# Patient Record
Sex: Female | Born: 1960
Health system: Southern US, Community
[De-identification: ages and names within clinical notes are randomized; demographics above are authoritative.]

## PROBLEM LIST (undated history)

## (undated) DIAGNOSIS — I10 Essential (primary) hypertension: Secondary | ICD-10-CM

## (undated) DIAGNOSIS — R569 Unspecified convulsions: Secondary | ICD-10-CM

## (undated) DIAGNOSIS — K3184 Gastroparesis: Secondary | ICD-10-CM

## (undated) DIAGNOSIS — B001 Herpesviral vesicular dermatitis: Secondary | ICD-10-CM

## (undated) DIAGNOSIS — F329 Major depressive disorder, single episode, unspecified: Secondary | ICD-10-CM

## (undated) DIAGNOSIS — E785 Hyperlipidemia, unspecified: Secondary | ICD-10-CM

## (undated) DIAGNOSIS — F32A Depression, unspecified: Secondary | ICD-10-CM

## (undated) DIAGNOSIS — G43909 Migraine, unspecified, not intractable, without status migrainosus: Secondary | ICD-10-CM

## (undated) DIAGNOSIS — E119 Type 2 diabetes mellitus without complications: Secondary | ICD-10-CM

## (undated) HISTORY — DX: Essential (primary) hypertension: I10

## (undated) HISTORY — DX: Major depressive disorder, single episode, unspecified: F32.9

## (undated) HISTORY — DX: Type 2 diabetes mellitus without complications: E11.9

## (undated) HISTORY — DX: Herpesviral vesicular dermatitis: B00.1

## (undated) HISTORY — DX: Migraine, unspecified, not intractable, without status migrainosus: G43.909

## (undated) HISTORY — DX: Unspecified convulsions: R56.9

## (undated) HISTORY — DX: Hyperlipidemia, unspecified: E78.5

## (undated) HISTORY — DX: Depression, unspecified: F32.A

## (undated) HISTORY — DX: Gastroparesis: K31.84

## (undated) HISTORY — PX: BACK SURGERY: SHX140

---

## 1998-03-26 ENCOUNTER — Observation Stay (HOSPITAL_COMMUNITY): Admission: RE | Admit: 1998-03-26 | Discharge: 1998-03-27 | Payer: Self-pay | Admitting: *Deleted

## 2002-02-19 ENCOUNTER — Encounter: Payer: Self-pay | Admitting: Obstetrics and Gynecology

## 2002-02-19 ENCOUNTER — Encounter: Admission: RE | Admit: 2002-02-19 | Discharge: 2002-02-19 | Payer: Self-pay | Admitting: Obstetrics and Gynecology

## 2002-06-05 HISTORY — PX: CHOLECYSTECTOMY: SHX55

## 2005-08-31 ENCOUNTER — Encounter: Admission: RE | Admit: 2005-08-31 | Discharge: 2005-10-19 | Payer: Self-pay | Admitting: *Deleted

## 2006-04-10 ENCOUNTER — Encounter: Admission: RE | Admit: 2006-04-10 | Discharge: 2006-04-10 | Payer: Self-pay | Admitting: Obstetrics and Gynecology

## 2007-09-30 ENCOUNTER — Encounter: Admission: RE | Admit: 2007-09-30 | Discharge: 2007-09-30 | Payer: Self-pay | Admitting: Obstetrics and Gynecology

## 2008-10-02 ENCOUNTER — Encounter: Admission: RE | Admit: 2008-10-02 | Discharge: 2008-10-02 | Payer: Self-pay | Admitting: Obstetrics and Gynecology

## 2009-10-20 ENCOUNTER — Encounter: Admission: RE | Admit: 2009-10-20 | Discharge: 2009-10-20 | Payer: Self-pay | Admitting: Obstetrics and Gynecology

## 2009-12-08 ENCOUNTER — Emergency Department (HOSPITAL_COMMUNITY): Admission: EM | Admit: 2009-12-08 | Discharge: 2009-12-08 | Payer: Self-pay | Admitting: Emergency Medicine

## 2010-01-18 ENCOUNTER — Encounter: Admission: RE | Admit: 2010-01-18 | Discharge: 2010-03-03 | Payer: Self-pay | Admitting: Neurosurgery

## 2010-02-01 ENCOUNTER — Encounter: Admission: RE | Admit: 2010-02-01 | Discharge: 2010-02-01 | Payer: Self-pay | Admitting: Neurosurgery

## 2010-06-26 ENCOUNTER — Encounter: Payer: Self-pay | Admitting: Obstetrics and Gynecology

## 2010-09-08 ENCOUNTER — Other Ambulatory Visit: Payer: Self-pay | Admitting: Obstetrics and Gynecology

## 2010-09-08 DIAGNOSIS — Z1231 Encounter for screening mammogram for malignant neoplasm of breast: Secondary | ICD-10-CM

## 2010-11-01 ENCOUNTER — Ambulatory Visit
Admission: RE | Admit: 2010-11-01 | Discharge: 2010-11-01 | Disposition: A | Discharge status: Home / Self Care | Payer: BC Managed Care – PPO | Source: Ambulatory Visit | Attending: Obstetrics and Gynecology | Admitting: Obstetrics and Gynecology

## 2010-11-01 DIAGNOSIS — Z1231 Encounter for screening mammogram for malignant neoplasm of breast: Secondary | ICD-10-CM

## 2011-08-31 ENCOUNTER — Other Ambulatory Visit: Payer: Self-pay | Admitting: Obstetrics and Gynecology

## 2011-08-31 DIAGNOSIS — Z1231 Encounter for screening mammogram for malignant neoplasm of breast: Secondary | ICD-10-CM

## 2011-11-02 ENCOUNTER — Ambulatory Visit: Payer: BC Managed Care – PPO

## 2011-11-03 ENCOUNTER — Ambulatory Visit: Payer: BC Managed Care – PPO

## 2012-01-09 ENCOUNTER — Ambulatory Visit
Admission: RE | Admit: 2012-01-09 | Discharge: 2012-01-09 | Disposition: A | Discharge status: Home / Self Care | Payer: BC Managed Care – PPO | Source: Ambulatory Visit | Attending: Obstetrics and Gynecology | Admitting: Obstetrics and Gynecology

## 2012-01-09 DIAGNOSIS — Z1231 Encounter for screening mammogram for malignant neoplasm of breast: Secondary | ICD-10-CM

## 2012-12-24 ENCOUNTER — Other Ambulatory Visit: Payer: Self-pay

## 2012-12-24 DIAGNOSIS — Z1231 Encounter for screening mammogram for malignant neoplasm of breast: Secondary | ICD-10-CM

## 2013-01-15 ENCOUNTER — Ambulatory Visit
Admission: RE | Admit: 2013-01-15 | Discharge: 2013-01-15 | Disposition: A | Discharge status: Home / Self Care | Payer: BC Managed Care – PPO | Source: Ambulatory Visit

## 2013-01-15 DIAGNOSIS — Z1231 Encounter for screening mammogram for malignant neoplasm of breast: Secondary | ICD-10-CM

## 2013-12-24 LAB — HM DIABETES EYE EXAM

## 2015-09-10 DIAGNOSIS — K589 Irritable bowel syndrome without diarrhea: Secondary | ICD-10-CM | POA: Diagnosis not present

## 2015-09-10 DIAGNOSIS — R197 Diarrhea, unspecified: Secondary | ICD-10-CM | POA: Diagnosis not present

## 2015-09-10 DIAGNOSIS — E1165 Type 2 diabetes mellitus with hyperglycemia: Secondary | ICD-10-CM | POA: Diagnosis not present

## 2015-10-06 DIAGNOSIS — Z713 Dietary counseling and surveillance: Secondary | ICD-10-CM | POA: Diagnosis not present

## 2015-12-09 DIAGNOSIS — G43909 Migraine, unspecified, not intractable, without status migrainosus: Secondary | ICD-10-CM | POA: Diagnosis not present

## 2015-12-09 DIAGNOSIS — K589 Irritable bowel syndrome without diarrhea: Secondary | ICD-10-CM | POA: Diagnosis not present

## 2015-12-09 DIAGNOSIS — E1165 Type 2 diabetes mellitus with hyperglycemia: Secondary | ICD-10-CM | POA: Diagnosis not present

## 2016-01-17 DIAGNOSIS — M79671 Pain in right foot: Secondary | ICD-10-CM | POA: Diagnosis not present

## 2016-01-17 DIAGNOSIS — M7989 Other specified soft tissue disorders: Secondary | ICD-10-CM | POA: Diagnosis not present

## 2016-01-31 ENCOUNTER — Ambulatory Visit (INDEPENDENT_AMBULATORY_CARE_PROVIDER_SITE_OTHER): Payer: BLUE CROSS/BLUE SHIELD | Admitting: Physician Assistant

## 2016-01-31 ENCOUNTER — Encounter (INDEPENDENT_AMBULATORY_CARE_PROVIDER_SITE_OTHER): Payer: Self-pay

## 2016-01-31 ENCOUNTER — Encounter: Payer: Self-pay | Admitting: Physician Assistant

## 2016-01-31 VITALS — BP 137/89 | HR 90 | Temp 99.9°F | Ht 66.0 in | Wt 187.4 lb

## 2016-01-31 DIAGNOSIS — J0120 Acute ethmoidal sinusitis, unspecified: Secondary | ICD-10-CM

## 2016-01-31 DIAGNOSIS — J209 Acute bronchitis, unspecified: Secondary | ICD-10-CM

## 2016-01-31 MED ORDER — ALBUTEROL SULFATE HFA 108 (90 BASE) MCG/ACT IN AERS: 2.0000 | INHALATION_SPRAY | Freq: Four times a day (QID) | RESPIRATORY_TRACT | 0 refills | Status: DC | PRN

## 2016-01-31 MED ORDER — CLARITHROMYCIN 500 MG PO TABS: 500.0000 mg | ORAL_TABLET | Freq: Two times a day (BID) | ORAL | 1 refills | Status: DC

## 2016-01-31 MED ORDER — HYDROCODONE-HOMATROPINE 5-1.5 MG/5ML PO SYRP: 10.0000 mL | ORAL_SOLUTION | Freq: Four times a day (QID) | ORAL | 0 refills | Status: DC | PRN

## 2016-01-31 MED ORDER — METHYLPREDNISOLONE ACETATE 80 MG/ML IJ SUSP
80.0000 mg | Freq: Once | INTRAMUSCULAR | Status: AC
  Administered 2016-01-31: 80 mg via INTRAMUSCULAR

## 2016-01-31 NOTE — Patient Instructions (Signed)

## 2016-01-31 NOTE — Progress Notes (Signed)
BP 137/89 (BP Location: Left Arm, Patient Position: Sitting, Cuff Size: Large)   Pulse 90   Temp 99.9 F (37.7 C) (Oral)   Ht 5\' 6"  (1.676 m)   Wt 187 lb 6.4 oz (85 kg)   BMI 30.25 kg/m    Subjective:    Patient ID: Carrie [name], female    DOB: [birth year], 55 y.o.   MRN: [id number]  HPI: Carrie [name] is a 55 y.o. female presenting on 01/31/2016 for Cough (x 2 weeks ); Hoarse; head congestion; and Eye Drainage (left. Both eyes were matted together this morining )  HPI Patient here to be established as new patient at [facility].  This patient is known to me from [facility].  Has had 2 weeks of severe cough and congestion. She coughs to the point of shortness of breath. She has had tremendous amount of production with her cough and with her sinuses. She states she has seen some brown discoloration of the drainage. This morning she woke up with even her eyes being very congested and mucus field. She has also had associated hoarseness going on. She does have an albuterol inhaler.  All of his other medications and medical conditions are reviewed today.  Relevant past medical, surgical, family and social history reviewed and updated as indicated. Interim medical history since our last visit reviewed. Allergies and medications reviewed and updated. DATA REVIEWED: CHART IN EPIC  Review of Systems  Constitutional: Positive for fatigue and fever. Negative for activity change.  HENT: Positive for congestion, ear pain, facial swelling, nosebleeds, postnasal drip, sinus pressure and sore throat.   Eyes: Negative.   Respiratory: Positive for cough and wheezing.   Cardiovascular: Negative.  Negative for chest pain.  Gastrointestinal: Negative.  Negative for abdominal pain.  Endocrine: Negative.   Genitourinary: Negative.  Negative for dysuria.  Musculoskeletal: Negative.   Skin: Negative.   Neurological: Negative.     Per HPI unless specifically indicated  above     Medication List       Accurate as of 01/31/16  3:48 PM. Always use your most recent med list.          albuterol 108 (90 Base) MCG/ACT inhaler Commonly known as:  PROVENTIL HFA;VENTOLIN HFA Inhale 2 puffs into the lungs every 6 (six) hours as needed for wheezing or shortness of breath.   clarithromycin 500 MG tablet Commonly known as:  BIAXIN Take 1 tablet (500 mg total) by mouth 2 (two) times daily.   HYDROcodone-homatropine 5-1.5 MG/5ML syrup Commonly known as:  HYCODAN Take 10 mLs by mouth every 6 (six) hours as needed for cough.   JANUVIA 100 MG tablet Generic drug:  sitaGLIPtin Take 100 mg by mouth daily.   metFORMIN 500 MG 24 hr tablet Commonly known as:  GLUCOPHAGE-XR Take 1,000 mg by mouth 2 (two) times daily.   PARoxetine 20 MG tablet Commonly known as:  PAXIL Take 20 mg by mouth daily.   SUMAtriptan 100 MG tablet Commonly known as:  IMITREX   topiramate 50 MG tablet Commonly known as:  TOPAMAX Take 50 mg by mouth daily.   valACYclovir 1000 MG tablet Commonly known as:  VALTREX          Objective:    BP 137/89 (BP Location: Left Arm, Patient Position: Sitting, Cuff Size: Large)   Pulse 90   Temp 99.9 F (37.7 C) (Oral)   Ht 5\' 6"  (1.676 m)   Wt 187 lb 6.4 oz (  85 kg)   BMI 30.25 kg/m   No Known Allergies  Wt Readings from Last 3 Encounters:  01/31/16 187 lb 6.4 oz (85 kg)    Physical Exam  Constitutional: She is oriented to person, place, and time. She appears well-developed and well-nourished.  HENT:  Head: Normocephalic and atraumatic.  Right Ear: There is drainage and tenderness.  Left Ear: There is drainage and tenderness.  Nose: Mucosal edema and rhinorrhea present. Right sinus exhibits maxillary sinus tenderness and frontal sinus tenderness. Left sinus exhibits maxillary sinus tenderness and frontal sinus tenderness.  Mouth/Throat: Oropharyngeal exudate and posterior oropharyngeal erythema present.  Eyes: Conjunctivae and  EOM are normal. Pupils are equal, round, and reactive to light.  Neck: Normal range of motion. Neck supple.  Cardiovascular: Normal rate, regular rhythm, normal heart sounds and intact distal pulses.   Pulmonary/Chest: Effort normal. She has wheezes in the right upper field and the left upper field.  Abdominal: Soft. Bowel sounds are normal.  Neurological: She is alert and oriented to person, place, and time. She has normal reflexes.  Skin: Skin is warm and dry. No rash noted.  Psychiatric: She has a normal mood and affect. Her behavior is normal. Judgment and thought content normal.    No results found for this or any previous visit.    Assessment & Plan:   1. Acute bronchitis, unspecified organism - clarithromycin (BIAXIN) 500 MG tablet; Take 1 tablet (500 mg total) by mouth 2 (two) times daily.  Dispense: 20 tablet; Refill: 1 - methylPREDNISolone acetate (DEPO-MEDROL) injection 80 mg; Inject 1 mL (80 mg total) into the muscle once. - HYDROcodone-homatropine (HYCODAN) 5-1.5 MG/5ML syrup; Take 10 mLs by mouth every 6 (six) hours as needed for cough.  Dispense: 240 mL; Refill: 0 - albuterol (PROVENTIL HFA;VENTOLIN HFA) 108 (90 Base) MCG/ACT inhaler; Inhale 2 puffs into the lungs every 6 (six) hours as needed for wheezing or shortness of breath.  Dispense: 1 Inhaler; Refill: 0  2. Acute ethmoidal sinusitis, recurrence not specified - clarithromycin (BIAXIN) 500 MG tablet; Take 1 tablet (500 mg total) by mouth 2 (two) times daily.  Dispense: 20 tablet; Refill: 1  No other refills are needed at this time. Continue all other maintenance medications as listed above.  Follow up plan: Return in about 2 months (around 04/01/2016) for diabetes.     Terald Sleeper PA-C [facility] [address]  [place], [place] [address] [phone]   01/31/2016, 3:48 PM

## 2016-02-14 DIAGNOSIS — Z01419 Encounter for gynecological examination (general) (routine) without abnormal findings: Secondary | ICD-10-CM | POA: Diagnosis not present

## 2016-02-14 DIAGNOSIS — Z113 Encounter for screening for infections with a predominantly sexual mode of transmission: Secondary | ICD-10-CM | POA: Diagnosis not present

## 2016-02-14 DIAGNOSIS — Z1389 Encounter for screening for other disorder: Secondary | ICD-10-CM | POA: Diagnosis not present

## 2016-02-14 DIAGNOSIS — Z124 Encounter for screening for malignant neoplasm of cervix: Secondary | ICD-10-CM | POA: Diagnosis not present

## 2016-02-14 DIAGNOSIS — R102 Pelvic and perineal pain: Secondary | ICD-10-CM | POA: Diagnosis not present

## 2016-02-14 DIAGNOSIS — Z1151 Encounter for screening for human papillomavirus (HPV): Secondary | ICD-10-CM | POA: Diagnosis not present

## 2016-02-14 DIAGNOSIS — Z1231 Encounter for screening mammogram for malignant neoplasm of breast: Secondary | ICD-10-CM | POA: Diagnosis not present

## 2016-02-14 DIAGNOSIS — Z130 Encounter for screening for diseases of the blood and blood-forming organs and certain disorders involving the immune mechanism: Secondary | ICD-10-CM | POA: Diagnosis not present

## 2016-03-16 ENCOUNTER — Other Ambulatory Visit: Payer: Self-pay | Admitting: *Deleted

## 2016-03-16 MED ORDER — PAROXETINE HCL 20 MG PO TABS: 20.0000 mg | ORAL_TABLET | Freq: Every day | ORAL | 1 refills | Status: DC

## 2016-04-03 ENCOUNTER — Encounter: Payer: Self-pay | Admitting: Physician Assistant

## 2016-04-03 ENCOUNTER — Ambulatory Visit (INDEPENDENT_AMBULATORY_CARE_PROVIDER_SITE_OTHER): Payer: BLUE CROSS/BLUE SHIELD | Admitting: Physician Assistant

## 2016-04-03 VITALS — BP 130/89 | HR 86 | Temp 97.5°F | Ht 66.0 in | Wt 185.2 lb

## 2016-04-03 DIAGNOSIS — Z1211 Encounter for screening for malignant neoplasm of colon: Secondary | ICD-10-CM

## 2016-04-03 DIAGNOSIS — E119 Type 2 diabetes mellitus without complications: Secondary | ICD-10-CM | POA: Diagnosis not present

## 2016-04-03 DIAGNOSIS — Z1159 Encounter for screening for other viral diseases: Secondary | ICD-10-CM

## 2016-04-03 DIAGNOSIS — K580 Irritable bowel syndrome with diarrhea: Secondary | ICD-10-CM | POA: Diagnosis not present

## 2016-04-03 DIAGNOSIS — K589 Irritable bowel syndrome without diarrhea: Secondary | ICD-10-CM | POA: Insufficient documentation

## 2016-04-03 LAB — BAYER DCA HB A1C WAIVED: HB A1C (BAYER DCA - WAIVED): 9.1 % — ABNORMAL HIGH (ref <7.0)

## 2016-04-03 NOTE — Patient Instructions (Signed)

## 2016-04-03 NOTE — Progress Notes (Signed)
BP 130/89   Pulse 86   Temp 97.5 F (36.4 C) (Oral)   Ht 5\' 6"  (1.676 m)   Wt 185 lb 3.2 oz (84 kg)   BMI 29.89 kg/m    Subjective:    Patient ID: Carrie [name], female    DOB: 06-Jul-1960, 55 y.o.   MRN: [id number]  HPI: Carrie [name] is a 55 y.o. female presenting on 04/03/2016 for Follow-up and Diabetes Patient reports that her sugars have been quite elevated. She is having a very hard time with controlling her appetite. I have explained to her that when her sugars are out of control like this that it will actually increase her appetite and cause her to have more cravings particularly for carbs. She is willing to look at the GLP medication. I've given her some information about mucosa. We will plan for pharmacy appointment with [facility]. They can discuss treatment options and diabetic counseling. Otherwise the patient is feeling quite good and not having any other issues.  Past Medical History:  Diagnosis Date  . Depression   . Diabetes mellitus without complication ([place])   . Fever blister   . Hyperlipidemia   . Hypertension   . Migraine    Relevant past medical, surgical, family and social history reviewed and updated as indicated. Interim medical history since our last visit reviewed. Allergies and medications reviewed and updated. DATA REVIEWED: CHART IN EPIC  Social History   Social History  . Marital status: Married    Spouse name: N/A  . Number of children: N/A  . Years of education: N/A   Occupational History  . Not on file.   Social History Main Topics  . Smoking status: Never Smoker  . Smokeless tobacco: Never Used  . Alcohol use Yes     Comment: occasional   . Drug use: No  . Sexual activity: Not on file   Other Topics Concern  . Not on file   Social History Narrative  . No narrative on file    Past Surgical History:  Procedure Laterality Date  . BACK SURGERY      Family History  Problem Relation Age of Onset  . Mental illness Daughter     . Migraines Daughter     Review of Systems    Medication List       Accurate as of 04/03/16  2:06 PM. Always use your most recent med list.          albuterol 108 (90 Base) MCG/ACT inhaler Commonly known as:  PROVENTIL HFA;VENTOLIN HFA Inhale 2 puffs into the lungs every 6 (six) hours as needed for wheezing or shortness of breath.   JANUVIA 100 MG tablet Generic drug:  sitaGLIPtin Take 100 mg by mouth daily.   metFORMIN 500 MG 24 hr tablet Commonly known as:  GLUCOPHAGE-XR Take 1,000 mg by mouth 2 (two) times daily.   PARoxetine 20 MG tablet Commonly known as:  PAXIL Take 1 tablet (20 mg total) by mouth daily.   SUMAtriptan 100 MG tablet Commonly known as:  IMITREX   topiramate 50 MG tablet Commonly known as:  TOPAMAX Take 50 mg by mouth daily.   valACYclovir 1000 MG tablet Commonly known as:  VALTREX          Objective:    BP 130/89   Pulse 86   Temp 97.5 F (36.4 C) (Oral)   Ht 5\' 6"  (1.676 m)   Wt 185 lb 3.2 oz (84 kg)  BMI 29.89 kg/m   No Known Allergies  Wt Readings from Last 3 Encounters:  04/03/16 185 lb 3.2 oz (84 kg)  01/31/16 187 lb 6.4 oz (85 kg)    Physical Exam  Results for orders placed or performed in visit on 04/03/16  Bayer DCA Hb A1c Waived  Result Value Ref Range   Bayer DCA Hb A1c Waived 9.1 (H) <7.0 %      Assessment & Plan:   1. Type 2 diabetes mellitus without complication, without long-term current use of insulin (HCC) - Bayer DCA Hb A1c Waived Metformin 1000 mg twice a day Review 100 mg 1 daily  2. Need for hepatitis C screening test - Hepatitis C antibody  3. Irritable bowel syndrome with diarrhea  4. Special screening for malignant neoplasms, colon - Fecal occult blood, imunochemical; Future   Continue all other maintenance medications as listed above.  Follow up plan: Return in about 1 day (around 04/04/2016) for Tammy Eckard incretin med and education.  Orders Placed This Encounter  Procedures   . Fecal occult blood, imunochemical  . Bayer DCA Hb A1c Waived  . Hepatitis C antibody    Educational handout given for Carb Counting  Terald Sleeper PA-C [facility] [address]  [place], [place] [address] [phone]   04/03/2016, 2:06 PM

## 2016-04-04 ENCOUNTER — Encounter: Payer: Self-pay | Admitting: Pharmacist

## 2016-04-04 ENCOUNTER — Ambulatory Visit (INDEPENDENT_AMBULATORY_CARE_PROVIDER_SITE_OTHER): Payer: BLUE CROSS/BLUE SHIELD | Admitting: Pharmacist

## 2016-04-04 DIAGNOSIS — E119 Type 2 diabetes mellitus without complications: Secondary | ICD-10-CM

## 2016-04-04 LAB — HEPATITIS C ANTIBODY: Hep C Virus Ab: <0.1 s/co ratio (ref 0.0–0.9)

## 2016-04-04 MED ORDER — LIRAGLUTIDE 18 MG/3ML ~~LOC~~ SOPN: PEN_INJECTOR | SUBCUTANEOUS | 2 refills | Status: DC

## 2016-04-04 MED ORDER — INSULIN PEN NEEDLE 33G X 4 MM MISC: 1.0000 | Freq: Every day | 2 refills | Status: DC

## 2016-04-04 NOTE — Progress Notes (Signed)
Patient ID: Carrie [name], female   DOB: Oct 09, 1960, 55 y.o.   MRN: [id number]   Subjective:    Carrie [name] is a 55 y.o. female who presents for evaluation of Type 2 diabetes mellitus at the request of her provide Particia Nearing.  She was seen yesterday and A1c was found to be 9.1%.  Current symptoms/problems include hyperglycemia and increased appetite.  and have been worsening. Symptoms have been present for 3 months.  Known diabetic complications: none Cardiovascular risk factors: diabetes mellitus, dyslipidemia, hypertension, obesity (BMI >= 30 kg/m2) and sedentary lifestyle Current diabetic medications include metformin XR 500mg   - take 1000mg  bid and Januvia 100mg  qd.   Current diet: tried to follow low CHO / CHO counting diet but admits that it has been harder to resist sweets recently Current exercise: none Medication Compliance?  Yes  Is She on ACE inhibitor or angiotensin II receptor blocker?  No       The following portions of the patient's history were reviewed and updated as appropriate: allergies, current medications, past medical history and problem list.    Objective:    There were no vitals taken for this visit.  Lab Review No results found for: GLUCOSE, POTASSIUM, CHLORIDE, CO2, BUN, CREATININE  A1c = 9.1%   Assessment:    Diabetes Mellitus type II, under inadequate control.    Plan:    1.  Rx changes: d/c Januvia.  Start Victoza - 0.6mg  daily for 1 week, then increase to 1.2mg  daily thereafter.    Continue metformin XR 500mg  - take 2 tablets bid. 2.  Education: Reviewed 'ABCs' of diabetes management (respective goals in parentheses):  A1C (<7), blood pressure (<130/80), and cholesterol (LDL <100). 3. Reviewed CHO amount in various foods and discussed recommended serving sizes. y  59.  Recommended increase physical activity - goal is 150 minutes per week 5. Follow up: 3 months

## 2016-04-04 NOTE — Patient Instructions (Signed)
Goal Blood glucose:    Fasting (before meals) = 80 to 130   Within 2 hours of eating = less than 180  Try to limit intake of these foods:  Fruit:   1/2 cup or once piece (baseball size)- apples, pears, pineapple, peaches, oranges  1 cup - berries, melons  1/2 banana or grapefruit  Stachy Vegetables:   1/2 cup potatoes (white or sweet), corn, peas  Other starches:   1 piece of bread  1/2 cup rice or pasta  4 inch pancake    These foods you can eat more freely: Proteins:   Fish  Chicken or [place]  Beef or pork (1 or 2 servings per week)  Eggs  Nuts (peanuts, walnuts, almonds, pistachios)  Cheese  Non starchy vegetables:  Green beans  Broccoli or cauliflower  Lettuce, greens, cabbage  Brussel Sprout  Carrots  Onions and peppers  Celery  Tomatoes  Asparagus  Eggplant  Cucumbers  Squash and Zucchini

## 2016-04-04 NOTE — Addendum Note (Signed)
Addended by: Terald Sleeper on: 04/04/2016 12:19 PM   Modules accepted: Orders

## 2016-04-05 ENCOUNTER — Telehealth: Payer: Self-pay | Admitting: Physician Assistant

## 2016-04-05 MED ORDER — JANUVIA 100 MG PO TABS: 100.0000 mg | ORAL_TABLET | Freq: Every day | ORAL | 6 refills | Status: DC

## 2016-04-05 NOTE — Addendum Note (Signed)
Addended by: Terald Sleeper on: 04/05/2016 08:01 AM   Modules accepted: Orders

## 2016-07-07 ENCOUNTER — Ambulatory Visit (INDEPENDENT_AMBULATORY_CARE_PROVIDER_SITE_OTHER): Payer: BLUE CROSS/BLUE SHIELD | Admitting: Physician Assistant

## 2016-07-07 ENCOUNTER — Encounter: Payer: Self-pay | Admitting: Physician Assistant

## 2016-07-07 ENCOUNTER — Other Ambulatory Visit: Payer: Self-pay | Admitting: Physician Assistant

## 2016-07-07 VITALS — BP 134/82 | HR 86 | Temp 99.0°F | Ht 66.0 in | Wt 182.6 lb

## 2016-07-07 DIAGNOSIS — E119 Type 2 diabetes mellitus without complications: Secondary | ICD-10-CM | POA: Diagnosis not present

## 2016-07-07 DIAGNOSIS — G43909 Migraine, unspecified, not intractable, without status migrainosus: Secondary | ICD-10-CM | POA: Insufficient documentation

## 2016-07-07 DIAGNOSIS — Z23 Encounter for immunization: Secondary | ICD-10-CM

## 2016-07-07 DIAGNOSIS — G43809 Other migraine, not intractable, without status migrainosus: Secondary | ICD-10-CM | POA: Diagnosis not present

## 2016-07-07 DIAGNOSIS — F324 Major depressive disorder, single episode, in partial remission: Secondary | ICD-10-CM

## 2016-07-07 LAB — BAYER DCA HB A1C WAIVED: HB A1C (BAYER DCA - WAIVED): 8.1 % — ABNORMAL HIGH (ref <7.0)

## 2016-07-07 MED ORDER — TOPIRAMATE 50 MG PO TABS: 50.0000 mg | ORAL_TABLET | Freq: Every day | ORAL | 11 refills | Status: DC

## 2016-07-07 MED ORDER — PAROXETINE HCL 30 MG PO TABS: 30.0000 mg | ORAL_TABLET | Freq: Every day | ORAL | 6 refills | Status: DC

## 2016-07-07 MED ORDER — INSULIN PEN NEEDLE 33G X 4 MM MISC: 1.0000 | Freq: Every day | 2 refills | Status: DC

## 2016-07-07 MED ORDER — LIRAGLUTIDE 18 MG/3ML ~~LOC~~ SOPN: PEN_INJECTOR | SUBCUTANEOUS | 2 refills | Status: DC

## 2016-07-07 MED ORDER — SUMATRIPTAN SUCCINATE 100 MG PO TABS: 100.0000 mg | ORAL_TABLET | ORAL | 6 refills | Status: DC | PRN

## 2016-07-07 MED ORDER — METFORMIN HCL ER 500 MG PO TB24: 1000.0000 mg | ORAL_TABLET | Freq: Two times a day (BID) | ORAL | 6 refills | Status: DC

## 2016-07-07 NOTE — Patient Instructions (Signed)
Major Depressive Disorder, Adult Major depressive disorder (MDD) is a mental health condition. It may also be called clinical depression or unipolar depression. MDD usually causes feelings of sadness, hopelessness, or helplessness. MDD can also cause physical symptoms. It can interfere with work, school, relationships, and other everyday activities. MDD may be mild, moderate, or severe. It may occur once (single episode major depressive disorder) or it may occur multiple times (recurrent major depressive disorder). What are the causes? The exact cause of this condition is not known. MDD is most likely caused by a combination of things, which may include:  Genetic factors. These are traits that are passed along from parent to child.  Individual factors. Your personality, your behavior, and the way you handle your thoughts and feelings may contribute to MDD. This includes personality traits and behaviors learned from others.  Physical factors, such as: ? Differences in the part of your brain that controls emotion. This part of your brain may be different than it is in people who do not have MDD. ? Long-term (chronic) medical or psychiatric illnesses.  Social factors. Traumatic experiences or major life changes may play a role in the development of MDD.  What increases the risk? This condition is more likely to develop in women. The following factors may also make you more likely to develop MDD:  A family history of depression.  Troubled family relationships.  Abnormally low levels of certain brain chemicals.  Traumatic events in childhood, especially abuse or the loss of a parent.  Being under a lot of stress, or long-term stress, especially from upsetting life experiences or losses.  A history of: ? Chronic physical illness. ? Other mental health disorders. ? Substance abuse.  Poor living conditions.  Experiencing social exclusion or discrimination on a regular basis.  What are  the signs or symptoms? The main symptoms of MDD typically include:  Constant depressed or irritable mood.  Loss of interest in things and activities.  MDD symptoms may also include:  Sleeping or eating too much or too little.  Unexplained weight change.  Fatigue or low energy.  Feelings of worthlessness or guilt.  Difficulty thinking clearly or making decisions.  Thoughts of suicide or of harming others.  Physical agitation or weakness.  Isolation.  Severe cases of MDD may also occur with other symptoms, such as:  Delusions or hallucinations, in which you imagine things that are not real (psychotic depression).  Low-level depression that lasts at least a year (chronic depression or persistent depressive disorder).  Extreme sadness and hopelessness (melancholic depression).  Trouble speaking and moving (catatonic depression).  How is this diagnosed? This condition may be diagnosed based on:  Your symptoms.  Your medical history, including your mental health history. This may involve tests to evaluate your mental health. You may be asked questions about your lifestyle, including any drug and alcohol use, and how long you have had symptoms of MDD.  A physical exam.  Blood tests to rule out other conditions.  You must have a depressed mood and at least four other MDD symptoms most of the day, nearly every day in the same 2-week timeframe before your health care provider can confirm a diagnosis of MDD. How is this treated? This condition is usually treated by mental health professionals, such as psychologists, psychiatrists, and clinical social workers. You may need more than one type of treatment. Treatment may include:  Psychotherapy. This is also called talk therapy or counseling. Types of psychotherapy include: ? Cognitive behavioral   therapy (CBT). This type of therapy teaches you to recognize unhealthy feelings, thoughts, and behaviors, and replace them with  positive thoughts and actions. ? Interpersonal therapy (IPT). This helps you to improve the way you relate to and communicate with others. ? Family therapy. This treatment includes members of your family.  Medicine to treat anxiety and depression, or to help you control certain emotions and behaviors.  Lifestyle changes, such as: ? Limiting alcohol and drug use. ? Exercising regularly. ? Getting plenty of sleep. ? Making healthy eating choices. ? Spending more time outdoors.  Treatments involving stimulation of the brain can be used in situations with extremely severe symptoms, or when medicine or other therapies do not work over time. These treatments include electroconvulsive therapy, transcranial magnetic stimulation, and vagal nerve stimulation. Follow these instructions at home: Activity  Return to your normal activities as told by your health care provider.  Exercise regularly and spend time outdoors as told by your health care provider. General instructions  Take over-the-counter and prescription medicines only as told by your health care provider.  Do not drink alcohol. If you drink alcohol, limit your alcohol intake to no more than 1 drink a day for nonpregnant women and 2 drinks a day for men. One drink equals 12 oz of beer, 5 oz of wine, or 1 oz of hard liquor. Alcohol can affect any antidepressant medicines you are taking. Talk to your health care provider about your alcohol use.  Eat a healthy diet and get plenty of sleep.  Find activities that you enjoy doing, and make time to do them.  Consider joining a support group. Your health care provider may be able to recommend a support group.  Keep all follow-up visits as told by your health care provider. This is important. Where to find more information: National Alliance on Mental Illness  www.nami.org  U.S. National Institute of Mental Health  www.nimh.nih.gov  National Suicide Prevention  Lifeline  1-800-273-TALK (8255). This is free, 24-hour help.  Contact a health care provider if:  Your symptoms get worse.  You develop new symptoms. Get help right away if:  You self-harm.  You have serious thoughts about hurting yourself or others.  You see, hear, taste, smell, or feel things that are not present (hallucinate). This information is not intended to replace advice given to you by your health care provider. Make sure you discuss any questions you have with your health care provider. Document Released: 09/16/2012 Document Revised: 01/27/2016 Document Reviewed: 12/01/2015 Elsevier Interactive Patient Education  2017 [employer]  

## 2016-07-07 NOTE — Telephone Encounter (Signed)
Order built

## 2016-07-07 NOTE — Progress Notes (Signed)
BP 134/82   Pulse 86   Temp 99 F (37.2 C) (Oral)   Ht 5\' 6"  (1.676 m)   Wt 182 lb 9.6 oz (82.8 kg)   BMI 29.47 kg/m    Subjective:    Patient ID: Carrie [name], female    DOB: [birth year], 56 y.o.   MRN: [id number]  HPI: Carrie [name] is a 56 y.o. female presenting on 07/07/2016 for Diabetes  This patient comes in for periodic recheck on medications and conditions. All medications are reviewed today. There are no reports of any problems with the medications. All of the medical conditions are reviewed and updated.  Lab work is reviewed and will be ordered as medically necessary.   She has had a slight bump up in her depression and anxious symptoms. She is having a lot of stressors in her family life and work life. She states she's had decreased concentration was made a couple of big mistakes in the past couple weeks. She has not had a long-standing history of ADHD or ADD. I have reassured her that oftentimes depression and anxiety will call she did have decreased concentration. We are going to look at her medications and make some adjustments and plan to see her back in 4 weeks. Next  She reports overall doing well with her migraines and diabetes. She is tolerating her medications well.  Past Medical History:  Diagnosis Date  . Depression   . Diabetes mellitus without complication ([place])   . Fever blister   . Hyperlipidemia   . Hypertension   . Migraine    Relevant past medical, surgical, family and social history reviewed and updated as indicated. Interim medical history since our last visit reviewed. Allergies and medications reviewed and updated. DATA REVIEWED: CHART IN EPIC  Social History   Social History  . Marital status: Married    Spouse name: N/A  . Number of children: N/A  . Years of education: N/A   Occupational History  . Not on file.   Social History Main Topics  . Smoking status: Never Smoker  . Smokeless tobacco: Never Used  . Alcohol use Yes   Comment: occasional   . Drug use: No  . Sexual activity: Not on file   Other Topics Concern  . Not on file   Social History Narrative  . No narrative on file    Past Surgical History:  Procedure Laterality Date  . BACK SURGERY      Family History  Problem Relation Age of Onset  . Mental illness Daughter   . Migraines Daughter     Review of Systems  Constitutional: Positive for fatigue.  HENT: Negative.   Eyes: Negative.   Respiratory: Negative.   Gastrointestinal: Negative.   Genitourinary: Negative.   Psychiatric/Behavioral: Positive for decreased concentration and dysphoric mood. Negative for agitation, hallucinations, self-injury, sleep disturbance and suicidal ideas. The patient is nervous/anxious.     Allergies as of 07/07/2016   No Known Allergies     Medication List       Accurate as of 07/07/16  8:40 AM. Always use your most recent med list.          albuterol 108 (90 Base) MCG/ACT inhaler Commonly known as:  PROVENTIL HFA;VENTOLIN HFA Inhale 2 puffs into the lungs every 6 (six) hours as needed for wheezing or shortness of breath.   Insulin Pen Needle 33G X 4 MM Misc Commonly known as:  ADVOCATE INSULIN PEN NEEDLES 1 each by Does not  apply route daily. Use to inject victoza once daily   liraglutide 18 MG/3ML Sopn Commonly known as:  VICTOZA Inject 0.6mg  into the skin once a day for 1 week, then increase 1.2mg  once a day thereafter.   metFORMIN 500 MG 24 hr tablet Commonly known as:  GLUCOPHAGE-XR Take 2 tablets (1,000 mg total) by mouth 2 (two) times daily.   PARoxetine 30 MG tablet Commonly known as:  PAXIL Take 1 tablet (30 mg total) by mouth daily.   SUMAtriptan 100 MG tablet Commonly known as:  IMITREX Take 1 tablet (100 mg total) by mouth every 2 (two) hours as needed for migraine.   topiramate 50 MG tablet Commonly known as:  TOPAMAX Take 1 tablet (50 mg total) by mouth daily.   valACYclovir 1000 MG tablet Commonly known as:   VALTREX          Objective:    BP 134/82   Pulse 86   Temp 99 F (37.2 C) (Oral)   Ht 5\' 6"  (1.676 m)   Wt 182 lb 9.6 oz (82.8 kg)   BMI 29.47 kg/m   No Known Allergies  Wt Readings from Last 3 Encounters:  07/07/16 182 lb 9.6 oz (82.8 kg)  04/03/16 185 lb 3.2 oz (84 kg)  01/31/16 187 lb 6.4 oz (85 kg)    Physical Exam  Constitutional: She is oriented to person, place, and time. She appears well-developed and well-nourished.  HENT:  Head: Normocephalic and atraumatic.  Eyes: Conjunctivae and EOM are normal. Pupils are equal, round, and reactive to light.  Cardiovascular: Normal rate, regular rhythm, normal heart sounds and intact distal pulses.   Pulmonary/Chest: Effort normal and breath sounds normal.  Abdominal: Soft. Bowel sounds are normal.  Neurological: She is alert and oriented to person, place, and time. She has normal reflexes.  Skin: Skin is warm and dry. No rash noted.  Psychiatric: Her behavior is normal. Judgment and thought content normal. Her mood appears anxious. She exhibits a depressed mood.    Results for orders placed or performed in visit on 04/03/16  Bayer DCA Hb A1c Waived  Result Value Ref Range   Bayer DCA Hb A1c Waived 9.1 (H) <7.0 %  Hepatitis C antibody  Result Value Ref Range   Hep C Virus Ab <0.1 0.0 - 0.9 s/co ratio      Assessment & Plan:   1. Type 2 diabetes mellitus without complication, without long-term current use of insulin (HCC) - liraglutide (VICTOZA) 18 MG/3ML SOPN; Inject 0.6mg  into the skin once a day for 1 week, then increase 1.2mg  once a day thereafter.  Dispense: 2 pen; Refill: 2 - Insulin Pen Needle (ADVOCATE INSULIN PEN NEEDLES) 33G X 4 MM MISC; 1 each by Does not apply route daily. Use to inject victoza once daily  Dispense: 100 each; Refill: 2 - metFORMIN (GLUCOPHAGE-XR) 500 MG 24 hr tablet; Take 2 tablets (1,000 mg total) by mouth 2 (two) times daily.  Dispense: 60 tablet; Refill: 6 - Bayer DCA Hb A1c Waived  2.  Depression, major, single episode, in partial remission (HCC) - PARoxetine (PAXIL) 30 MG tablet; Take 1 tablet (30 mg total) by mouth daily.  Dispense: 30 tablet; Refill: 6  3. Other migraine without status migrainosus, not intractable - topiramate (TOPAMAX) 50 MG tablet; Take 1 tablet (50 mg total) by mouth daily.  Dispense: 30 tablet; Refill: 11 - SUMAtriptan (IMITREX) 100 MG tablet; Take 1 tablet (100 mg total) by mouth every 2 (two) hours as needed  for migraine.  Dispense: 10 tablet; Refill: 6   Continue all other maintenance medications as listed above.  Follow up plan: Return in about 4 weeks (around 08/04/2016) for recheck med.  Orders Placed This Encounter  Procedures  . [facility] Hb A1c [employer] given for depression  Terald Sleeper PA-C [facility] [address]  [place], [place] [address] [phone]   07/07/2016, 8:40 AM

## 2016-07-10 ENCOUNTER — Telehealth: Payer: Self-pay | Admitting: Physician Assistant

## 2016-07-13 NOTE — Telephone Encounter (Signed)
Had at [facility]

## 2016-08-08 ENCOUNTER — Ambulatory Visit (INDEPENDENT_AMBULATORY_CARE_PROVIDER_SITE_OTHER): Payer: BLUE CROSS/BLUE SHIELD | Admitting: Physician Assistant

## 2016-08-08 ENCOUNTER — Encounter: Payer: Self-pay | Admitting: Physician Assistant

## 2016-08-08 VITALS — BP 127/77 | HR 78 | Temp 98.0°F | Ht 66.0 in | Wt 181.6 lb

## 2016-08-08 DIAGNOSIS — B351 Tinea unguium: Secondary | ICD-10-CM

## 2016-08-08 DIAGNOSIS — E119 Type 2 diabetes mellitus without complications: Secondary | ICD-10-CM | POA: Diagnosis not present

## 2016-08-08 DIAGNOSIS — J309 Allergic rhinitis, unspecified: Secondary | ICD-10-CM

## 2016-08-08 DIAGNOSIS — F324 Major depressive disorder, single episode, in partial remission: Secondary | ICD-10-CM | POA: Diagnosis not present

## 2016-08-08 DIAGNOSIS — M79609 Pain in unspecified limb: Secondary | ICD-10-CM

## 2016-08-08 MED ORDER — CEPHALEXIN 500 MG PO CAPS: 500.0000 mg | ORAL_CAPSULE | Freq: Four times a day (QID) | ORAL | 0 refills | Status: DC

## 2016-08-08 MED ORDER — LORATADINE 10 MG PO TABS: 10.0000 mg | ORAL_TABLET | Freq: Every day | ORAL | 3 refills | Status: DC

## 2016-08-08 MED ORDER — FLUTICASONE PROPIONATE 50 MCG/ACT NA SUSP: 2.0000 | Freq: Every day | NASAL | 3 refills | Status: DC

## 2016-08-08 NOTE — Patient Instructions (Signed)

## 2016-08-08 NOTE — Progress Notes (Signed)
BP 127/77   Pulse 78   Temp 98 F (36.7 C) (Oral)   Ht 5\' 6"  (1.676 m)   Wt 181 lb 9.6 oz (82.4 kg)   BMI 29.31 kg/m    Subjective:    Patient ID: Carrie [name], female    DOB: 05/07/1961, 56 y.o.   MRN: [id number]  HPI: Carrie [name] is a 56 y.o. female presenting on 08/08/2016 for Follow-up (1 month follow up on medication ); Allergies; and cricket sound in ears (For a couple of months )  This patient comes in for periodic recheck on medications and conditions including depression, diabetes. Some nail issues and allergy congestion severe. Patient reports that the Paxil has helped tremendously at this dose. She has gone to the death of her mother who was not a close person in her life but she still had to process it. She felt that she went through the process very well. She is also still having significant stress at work but things seem to be getting better and she is able to concentrate much more. She is also having large amount of allergies flaring up even causing her ears do feel full and giving her ringing in her ears. She is not taking any allergy she medicine at this time. And finally she had had nails that have grown out over the past year that were yellow in color. They are almost completely grown out. She is having a little bit of tenderness along the great toe at the paronychia.  All medications are reviewed today. There are no reports of any problems with the medications. All of the medical conditions are reviewed and updated.  Lab work is reviewed and will be ordered as medically necessary. There are no new problems reported with today's visit.   Relevant past medical, surgical, family and social history reviewed and updated as indicated. Allergies and medications reviewed and updated.  Past Medical History:  Diagnosis Date  . Depression   . Diabetes mellitus without complication ([place])   . Fever blister   . Hyperlipidemia   . Hypertension   . Migraine     Past Surgical  History:  Procedure Laterality Date  . BACK SURGERY      Review of Systems  Constitutional: Negative.  Negative for activity change, fatigue and fever.  HENT: Positive for congestion, postnasal drip and tinnitus.   Eyes: Negative.   Respiratory: Negative.  Negative for cough.   Cardiovascular: Negative.  Negative for chest pain.  Gastrointestinal: Negative.  Negative for abdominal pain.  Endocrine: Negative.   Genitourinary: Negative.  Negative for dysuria.  Musculoskeletal: Negative.   Skin: Positive for color change.  Neurological: Negative.     Allergies as of 08/08/2016   No Known Allergies     Medication List       Accurate as of 08/08/16 10:07 AM. Always use your most recent med list.          albuterol 108 (90 Base) MCG/ACT inhaler Commonly known as:  PROVENTIL HFA;VENTOLIN HFA Inhale 2 puffs into the lungs every 6 (six) hours as needed for wheezing or shortness of breath.   cephALEXin 500 MG capsule Commonly known as:  KEFLEX Take 1 capsule (500 mg total) by mouth 4 (four) times daily.   fluticasone 50 MCG/ACT nasal spray Commonly known as:  FLONASE Place 2 sprays into both nostrils daily.   Insulin Pen Needle 33G X 4 MM Misc Commonly known as:  ADVOCATE INSULIN PEN NEEDLES 1 each  by Does not apply route daily. Use to inject victoza once daily   liraglutide 18 MG/3ML Sopn Commonly known as:  VICTOZA Inject 0.6mg  into the skin once a day for 1 week, then increase 1.2mg  once a day thereafter.   loratadine 10 MG tablet Commonly known as:  CLARITIN Take 1 tablet (10 mg total) by mouth daily.   metFORMIN 500 MG 24 hr tablet Commonly known as:  GLUCOPHAGE-XR Take 2 tablets (1,000 mg total) by mouth 2 (two) times daily.   PARoxetine 30 MG tablet Commonly known as:  PAXIL Take 1 tablet (30 mg total) by mouth daily.   SUMAtriptan 100 MG tablet Commonly known as:  IMITREX Take 1 tablet (100 mg total) by mouth every 2 (two) hours as needed for migraine.     topiramate 50 MG tablet Commonly known as:  TOPAMAX Take 1 tablet (50 mg total) by mouth daily.   valACYclovir 1000 MG tablet Commonly known as:  VALTREX          Objective:    BP 127/77   Pulse 78   Temp 98 F (36.7 C) (Oral)   Ht 5\' 6"  (1.676 m)   Wt 181 lb 9.6 oz (82.4 kg)   BMI 29.31 kg/m   No Known Allergies  Physical Exam  Constitutional: She is oriented to person, place, and time. She appears well-developed and well-nourished.  HENT:  Head: Normocephalic and atraumatic.  Right Ear: Tympanic membrane, external ear and ear canal normal.  Left Ear: Tympanic membrane, external ear and ear canal normal.  Nose: Nose normal. No rhinorrhea.  Mouth/Throat: Oropharynx is clear and moist and mucous membranes are normal. No oropharyngeal exudate or posterior oropharyngeal erythema.  Eyes: Conjunctivae and EOM are normal. Pupils are equal, round, and reactive to light.  Neck: Normal range of motion. Neck supple.  Cardiovascular: Normal rate, regular rhythm, normal heart sounds and intact distal pulses.   Pulmonary/Chest: Effort normal and breath sounds normal.  Abdominal: Soft. Bowel sounds are normal.  Neurological: She is alert and oriented to person, place, and time. She has normal reflexes.  Skin: Skin is warm and dry. No rash noted. There is erythema.     Slight redness on left medial great toe, no drainage. Nails grown out with remnant of yellowing.  Psychiatric: She has a normal mood and affect. Her behavior is normal. Judgment and thought content normal.   Diabetic Foot Exam - Simple   Simple Foot Form Diabetic Foot exam was performed with the following findings:  Yes 08/08/2016 10:10 AM  Visual Inspection No deformities, no ulcerations, no other skin breakdown bilaterally:  Yes Sensation Testing Intact to touch and monofilament testing bilaterally:  Yes Pulse Check Posterior Tibialis and Dorsalis pulse intact bilaterally:  Yes Comments     Results for orders  placed or performed in visit on 07/07/16  Bayer DCA Hb A1c Waived  Result Value Ref Range   Bayer DCA Hb A1c Waived 8.1 (H) <7.0 %      Assessment & Plan:   1. Depression, major, single episode, in partial remission ([place])  2. Type 2 diabetes mellitus without complication, without long-term current use of insulin (HCC)  3. Pain due to onychomycosis of nail Nail grown, podiatry if worsens. Med given if paronychia worsens. - cephALEXin (KEFLEX) 500 MG capsule; Take 1 capsule (500 mg total) by mouth 4 (four) times daily.  Dispense: 40 capsule; Refill: 0  4. Allergic rhinitis, unspecified chronicity, unspecified seasonality, unspecified trigger - fluticasone (FLONASE) 50  MCG/ACT nasal spray; Place 2 sprays into both nostrils daily.  Dispense: 48 g; Refill: 3 - loratadine (CLARITIN) 10 MG tablet; Take 1 tablet (10 mg total) by mouth daily.  Dispense: 90 tablet; Refill: 3   Continue all other maintenance medications as listed above.  Follow up plan: Return in about 2 months (around 10/08/2016) for recheck and A1c.  Educational handout given for fungal nails  Terald Sleeper PA-C [facility] [address]  [place], [place] [address] [phone]   08/08/2016, 10:07 AM

## 2016-08-11 ENCOUNTER — Other Ambulatory Visit: Payer: Self-pay | Admitting: Physician Assistant

## 2016-08-11 DIAGNOSIS — E119 Type 2 diabetes mellitus without complications: Secondary | ICD-10-CM

## 2016-08-14 MED ORDER — LIRAGLUTIDE 18 MG/3ML ~~LOC~~ SOPN: PEN_INJECTOR | SUBCUTANEOUS | 2 refills | Status: DC

## 2016-09-06 DIAGNOSIS — R208 Other disturbances of skin sensation: Secondary | ICD-10-CM | POA: Diagnosis not present

## 2016-09-06 DIAGNOSIS — N909 Noninflammatory disorder of vulva and perineum, unspecified: Secondary | ICD-10-CM | POA: Diagnosis not present

## 2016-10-09 ENCOUNTER — Ambulatory Visit (INDEPENDENT_AMBULATORY_CARE_PROVIDER_SITE_OTHER): Payer: BLUE CROSS/BLUE SHIELD | Admitting: Physician Assistant

## 2016-10-09 ENCOUNTER — Encounter: Payer: Self-pay | Admitting: Physician Assistant

## 2016-10-09 VITALS — BP 129/79 | HR 80 | Temp 97.6°F | Ht 66.0 in | Wt 185.0 lb

## 2016-10-09 DIAGNOSIS — E119 Type 2 diabetes mellitus without complications: Secondary | ICD-10-CM

## 2016-10-09 DIAGNOSIS — W57XXXA Bitten or stung by nonvenomous insect and other nonvenomous arthropods, initial encounter: Secondary | ICD-10-CM | POA: Diagnosis not present

## 2016-10-09 LAB — BAYER DCA HB A1C WAIVED: HB A1C (BAYER DCA - WAIVED): 9.2 % — ABNORMAL HIGH (ref <7.0)

## 2016-10-09 MED ORDER — DOXYCYCLINE HYCLATE 100 MG PO TABS: 100.0000 mg | ORAL_TABLET | Freq: Two times a day (BID) | ORAL | 0 refills | Status: DC

## 2016-10-09 NOTE — Patient Instructions (Signed)
Insect Bite, Adult An insect bite can make your skin red, itchy, and swollen. Some insects can spread disease to people with a bite. However, most insect bites do not lead to disease, and most are not serious. Follow these instructions at home: Bite area care   Do not scratch the bite area.  Keep the bite area clean and dry.  Wash the bite area every day with soap and water as told by your doctor.  Check the bite area every day for signs of infection. Check for:  More redness, swelling, or pain.  Fluid or blood.  Warmth.  Pus. Managing pain, itching, and swelling   You may put any of these on the bite area as told by your doctor:  A baking soda paste.  Cortisone cream.  Calamine lotion.  If directed, put ice on the bite area.  Put ice in a plastic bag.  Place a towel between your skin and the bag.  Leave the ice on for 20 minutes, 2-3 times a day. Medicines   Take medicines or put medicines on your skin only as told by your doctor.  If you were prescribed an antibiotic medicine, use it as told by your doctor. Do not stop using the antibiotic even if your condition improves. General instructions   Keep all follow-up visits as told by your doctor. This is important. How is this prevented? To help you have a lower risk of insect bites:  When you are outside, wear clothing that covers your arms and legs.  Use insect repellent. The best insect repellents have:  An active ingredient of DEET, picaridin, oil of lemon eucalyptus (OLE), or IR3535.  Higher amounts of DEET or another active ingredient than other repellents have.  If your home windows do not have screens, think about putting some in. Contact a doctor if:  You have more redness, swelling, or pain in the bite area.  You have fluid, blood, or pus coming from the bite area.  The bite area feels warm.  You have a fever. Get help right away if:  You have joint pain.  You have a rash.  You have  shortness of breath.  You feel more tired or sleepy than you normally do.  You have neck pain.  You have a headache.  You feel weaker than you normally do.  You have chest pain.  You have pain in your belly.  You feel sick to your stomach (nauseous) or you throw up (vomit). Summary  An insect bite can make your skin red, itchy, and swollen.  Do not scratch the bite area, and keep it clean and dry.  Ice can help with pain and itching from the bite. This information is not intended to replace advice given to you by your health care provider. Make sure you discuss any questions you have with your health care provider. Document Released: 05/19/2000 Document Revised: 12/23/2015 Document Reviewed: 10/07/2014 Elsevier Interactive Patient Education  2017 Elsevier Inc.  

## 2016-10-09 NOTE — Progress Notes (Signed)
BP 129/79   Pulse 80   Temp 97.6 F (36.4 C) (Oral)   Ht 5\' 6"  (1.676 m)   Wt 185 lb (83.9 kg)   BMI 29.86 kg/m    Subjective:    Patient ID: Carrie [name], female    DOB: 1961/02/25, 56 y.o.   MRN: [id number]  HPI: Carrie [name] is a 56 y.o. female presenting on 10/09/2016 for Follow-up (2 month ) and Bite on back This patient comes in for periodic recheck on medications and conditions including diabetes, htn, hyperlipidemia.  Also has a bite on her sacral area. Denies fever, chills, rash or headache. No NVD, currently on amoxil with no resolution of swelling..   All medications are reviewed today. There are no reports of any problems with the medications. All of the medical conditions are reviewed and updated.  Lab work is reviewed and will be ordered as medically necessary. There are no new problems reported with today's visit.    Relevant past medical, surgical, family and social history reviewed and updated as indicated. Allergies and medications reviewed and updated.  Past Medical History:  Diagnosis Date  . Depression   . Diabetes mellitus without complication ([place])   . Fever blister   . Hyperlipidemia   . Hypertension   . Migraine     Past Surgical History:  Procedure Laterality Date  . BACK SURGERY      Review of Systems  Constitutional: Negative.  Negative for activity change, fatigue and fever.  HENT: Negative.   Eyes: Negative.   Respiratory: Negative.  Negative for cough.   Cardiovascular: Negative.  Negative for chest pain.  Gastrointestinal: Negative.  Negative for abdominal pain.  Endocrine: Negative.   Genitourinary: Negative.  Negative for dysuria.  Musculoskeletal: Negative.   Skin: Positive for color change, rash and wound.  Neurological: Negative.     Allergies as of 10/09/2016   No Known Allergies     Medication List       Accurate as of 10/09/16  9:59 AM. Always use your most recent med list.          albuterol 108 (90 Base) MCG/ACT  inhaler Commonly known as:  PROVENTIL HFA;VENTOLIN HFA Inhale 2 puffs into the lungs every 6 (six) hours as needed for wheezing or shortness of breath.   amoxicillin 500 MG capsule Commonly known as:  AMOXIL   doxycycline 100 MG tablet Commonly known as:  VIBRA-TABS Take 1 tablet (100 mg total) by mouth 2 (two) times daily. 1 po bid   fluticasone 50 MCG/ACT nasal spray Commonly known as:  FLONASE Place 2 sprays into both nostrils daily.   HYDROcodone-acetaminophen 5-325 MG tablet Commonly known as:  NORCO/VICODIN Take 1 tablet by mouth every 6 (six) hours as needed. for pain   ibuprofen 800 MG tablet Commonly known as:  ADVIL,MOTRIN   Insulin Pen Needle 33G X 4 MM Misc Commonly known as:  ADVOCATE INSULIN PEN NEEDLES 1 each by Does not apply route daily. Use to inject victoza once daily   liraglutide 18 MG/3ML Sopn Commonly known as:  VICTOZA Inject 0.6mg  into the skin once a day for 1 week, then increase 1.2mg  once a day thereafter.   loratadine 10 MG tablet Commonly known as:  CLARITIN Take 1 tablet (10 mg total) by mouth daily.   metFORMIN 500 MG 24 hr tablet Commonly known as:  GLUCOPHAGE-XR Take 2 tablets (1,000 mg total) by mouth 2 (two) times daily.   PARoxetine 30 MG tablet  Commonly known as:  PAXIL Take 1 tablet (30 mg total) by mouth daily.   SUMAtriptan 100 MG tablet Commonly known as:  IMITREX Take 1 tablet (100 mg total) by mouth every 2 (two) hours as needed for migraine.   topiramate 50 MG tablet Commonly known as:  TOPAMAX Take 1 tablet (50 mg total) by mouth daily.   valACYclovir 1000 MG tablet Commonly known as:  VALTREX          Objective:    BP 129/79   Pulse 80   Temp 97.6 F (36.4 C) (Oral)   Ht 5\' 6"  (1.676 m)   Wt 185 lb (83.9 kg)   BMI 29.86 kg/m   No Known Allergies  Physical Exam  Constitutional: She is oriented to person, place, and time. She appears well-developed and well-nourished.  HENT:  Head: Normocephalic and  atraumatic.  Eyes: Conjunctivae and EOM are normal. Pupils are equal, round, and reactive to light.  Cardiovascular: Normal rate, regular rhythm, normal heart sounds and intact distal pulses.   Pulmonary/Chest: Effort normal and breath sounds normal.  Abdominal: Soft. Bowel sounds are normal.  Neurological: She is alert and oriented to person, place, and time. She has normal reflexes.  Skin: Skin is warm and dry. Lesion and rash noted. Rash is macular. There is erythema.     Psychiatric: She has a normal mood and affect. Her behavior is normal. Judgment and thought content normal.    Results for orders placed or performed in visit on 07/07/16  Bayer DCA Hb A1c Waived  Result Value Ref Range   Bayer DCA Hb A1c Waived 8.1 (H) <7.0 %      Assessment & Plan:   1. Type 2 diabetes mellitus without complication, without long-term current use of insulin (HCC) - Bayer DCA Hb A1c Waived  2. Insect bite, initial encounter - doxycycline (VIBRA-TABS) 100 MG tablet; Take 1 tablet (100 mg total) by mouth 2 (two) times daily. 1 po bid  Dispense: 20 tablet; Refill: 0   Current Outpatient Prescriptions:  .  albuterol (PROVENTIL HFA;VENTOLIN HFA) 108 (90 Base) MCG/ACT inhaler, Inhale 2 puffs into the lungs every 6 (six) hours as needed for wheezing or shortness of breath., Disp: 1 Inhaler, Rfl: 0 .  amoxicillin (AMOXIL) 500 MG capsule, , Disp: , Rfl: 0 .  fluticasone (FLONASE) 50 MCG/ACT nasal spray, Place 2 sprays into both nostrils daily., Disp: 48 g, Rfl: 3 .  HYDROcodone-acetaminophen (NORCO/VICODIN) 5-325 MG tablet, Take 1 tablet by mouth every 6 (six) hours as needed. for pain, Disp: , Rfl: 0 .  ibuprofen (ADVIL,MOTRIN) 800 MG tablet, , Disp: , Rfl: 0 .  Insulin Pen Needle (ADVOCATE INSULIN PEN NEEDLES) 33G X 4 MM MISC, 1 each by Does not apply route daily. Use to inject victoza once daily, Disp: 100 each, Rfl: 2 .  liraglutide (VICTOZA) 18 MG/3ML SOPN, Inject 0.6mg  into the skin once a day for  1 week, then increase 1.2mg  once a day thereafter., Disp: 2 pen, Rfl: 2 .  loratadine (CLARITIN) 10 MG tablet, Take 1 tablet (10 mg total) by mouth daily., Disp: 90 tablet, Rfl: 3 .  metFORMIN (GLUCOPHAGE-XR) 500 MG 24 hr tablet, Take 2 tablets (1,000 mg total) by mouth 2 (two) times daily., Disp: 60 tablet, Rfl: 6 .  PARoxetine (PAXIL) 30 MG tablet, Take 1 tablet (30 mg total) by mouth daily., Disp: 30 tablet, Rfl: 6 .  SUMAtriptan (IMITREX) 100 MG tablet, Take 1 tablet (100 mg total) by mouth  every 2 (two) hours as needed for migraine., Disp: 10 tablet, Rfl: 6 .  topiramate (TOPAMAX) 50 MG tablet, Take 1 tablet (50 mg total) by mouth daily., Disp: 30 tablet, Rfl: 11 .  valACYclovir (VALTREX) 1000 MG tablet, , Disp: , Rfl: 0 .  doxycycline (VIBRA-TABS) 100 MG tablet, Take 1 tablet (100 mg total) by mouth 2 (two) times daily. 1 po bid, Disp: 20 tablet, Rfl: 0  Continue all other maintenance medications as listed above.  Follow up plan: No Follow-up on file.  Educational handout given for insect bite  Terald Sleeper PA-C [facility] [address]  [place], [place] [address] [phone]   10/09/2016, 9:59 AM

## 2016-10-10 ENCOUNTER — Ambulatory Visit: Payer: BLUE CROSS/BLUE SHIELD | Admitting: Physician Assistant

## 2016-10-17 ENCOUNTER — Other Ambulatory Visit: Payer: Self-pay | Admitting: Physician Assistant

## 2016-10-17 DIAGNOSIS — E119 Type 2 diabetes mellitus without complications: Secondary | ICD-10-CM

## 2016-10-18 ENCOUNTER — Other Ambulatory Visit: Payer: Self-pay

## 2016-10-18 DIAGNOSIS — E119 Type 2 diabetes mellitus without complications: Secondary | ICD-10-CM

## 2016-10-18 MED ORDER — LIRAGLUTIDE 18 MG/3ML ~~LOC~~ SOPN: PEN_INJECTOR | SUBCUTANEOUS | 2 refills | Status: DC

## 2016-10-24 ENCOUNTER — Encounter: Payer: Self-pay | Admitting: Nurse Practitioner

## 2016-10-24 ENCOUNTER — Telehealth: Payer: Self-pay | Admitting: Physician Assistant

## 2016-10-24 DIAGNOSIS — R109 Unspecified abdominal pain: Secondary | ICD-10-CM

## 2016-10-24 DIAGNOSIS — R110 Nausea: Secondary | ICD-10-CM

## 2016-10-24 NOTE — Telephone Encounter (Signed)
Is it related to her migraines (and associated nausea/vomiting) or is it a separate set of GI symptoms that are shronic and could benefit from a GI referral?

## 2016-10-24 NOTE — Telephone Encounter (Signed)
Patient aware and referral placed.

## 2016-10-24 NOTE — Telephone Encounter (Signed)
Referral to GI for chronic abdominal pain and recurrent nausea

## 2016-10-24 NOTE — Telephone Encounter (Signed)
Patient has been out today due to a headache and vomiting. Patient wants to know if there is a test or something that she could check. Her work told her this morning that she seems to be out a lot due to stomach virus. She is starting to get concerned for her job.

## 2016-10-24 NOTE — Telephone Encounter (Signed)
She states that she does not feel that they are related to her migraines.

## 2016-10-27 ENCOUNTER — Ambulatory Visit (INDEPENDENT_AMBULATORY_CARE_PROVIDER_SITE_OTHER): Payer: BLUE CROSS/BLUE SHIELD | Admitting: Nurse Practitioner

## 2016-10-27 ENCOUNTER — Encounter: Payer: Self-pay | Admitting: Nurse Practitioner

## 2016-10-27 VITALS — BP 128/72 | HR 76 | Ht 66.0 in | Wt 189.0 lb

## 2016-10-27 DIAGNOSIS — R51 Headache: Secondary | ICD-10-CM

## 2016-10-27 DIAGNOSIS — R1110 Vomiting, unspecified: Secondary | ICD-10-CM

## 2016-10-27 DIAGNOSIS — K529 Noninfective gastroenteritis and colitis, unspecified: Secondary | ICD-10-CM | POA: Diagnosis not present

## 2016-10-27 DIAGNOSIS — R519 Headache, unspecified: Secondary | ICD-10-CM

## 2016-10-27 NOTE — Progress Notes (Addendum)
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 Patient is here for evaluation of intermittent vomiting. She describes getting viruses about once a month over the last 3 years.  Interestingly though, episodes always start with a generalized headache. After about 4 hours of a headache patient starts having nausea and vomiting. Symptoms last for about 24 hours and usually don't respond to her migraine medications or NSAIDs. She has a history of migraines but these headaches are different. She has chronic (years) loose stool but her bowel movements stay at baseline during these episodes.  She is not febrile during these episodes. No associated weight loss. Patient has chronic non-radiating mid upper abdominal discomfort. The discomfort is almost constant but worse after meals. Her weight is stable. She does not have her gallbladder. She had a colonoscopy in [place] approx 7 years ago but cannot recall where.    Past Medical History:  Diagnosis Date  . Depression   . Diabetes mellitus without complication ([place])   . Fever blister   . Hyperlipidemia   . Hypertension   . Migraine      Past Surgical History:  Procedure Laterality Date  . BACK SURGERY    . CHOLECYSTECTOMY  2004   Family History  Problem Relation Age of Onset  . Ovarian cancer Mother   . Mental illness Daughter   . Migraines Daughter    Social History  Substance Use Topics  . Smoking status: Never Smoker  . Smokeless tobacco: Never Used  . Alcohol use Yes     Comment: occasional    Current Outpatient Prescriptions  Medication Sig Dispense Refill  . amoxicillin (AMOXIL) 500 MG capsule   0  . Insulin Pen Needle (ADVOCATE INSULIN PEN NEEDLES) 33G X 4 MM MISC 1 each by Does not apply route daily. Use to inject victoza once daily 100 each 2  . liraglutide (VICTOZA) 18 MG/3ML SOPN Inject 0.6mg  into the skin once a day for 1 week, then increase 1.2mg  once a day  thereafter. 2 pen 2  . liraglutide (VICTOZA) 18 MG/3ML SOPN Use 1.2mg  daily 3 mL 2  . loratadine (CLARITIN) 10 MG tablet Take 1 tablet (10 mg total) by mouth daily. 90 tablet 3  . metFORMIN (GLUCOPHAGE-XR) 500 MG 24 hr tablet Take 2 tablets (1,000 mg total) by mouth 2 (two) times daily. 60 tablet 6  . PARoxetine (PAXIL) 30 MG tablet Take 1 tablet (30 mg total) by mouth daily. 30 tablet 6  . SUMAtriptan (IMITREX) 100 MG tablet Take 1 tablet (100 mg total) by mouth every 2 (two) hours as needed for migraine. 10 tablet 6  . topiramate (TOPAMAX) 50 MG tablet Take 1 tablet (50 mg total) by mouth daily. 30 tablet 11  . valACYclovir (VALTREX) 1000 MG tablet AS NEEDED  0   No current facility-administered medications for this visit.    No Known Allergies   Review of Systems: All systems reviewed and negative except where noted in HPI.    Physical Exam: BP 128/72 (BP Location: Left Arm, Patient Position: Sitting, Cuff Size: Normal)   Pulse 76   Ht 5\' 6"  (1.676 m)   Wt 189 lb (85.7 kg)   SpO2 98%   BMI 30.51 kg/m  Constitutional:  Well-developed, white female in no acute distress. Psychiatric: Normal mood and affect. Behavior is normal. HEENT: Normocephalic and atraumatic. Conjunctivae are normal. No scleral icterus. Neck supple.  Cardiovascular: Normal rate, regular  rhythm.  Pulmonary/chest: Effort normal and breath sounds normal. No wheezing, rales or rhonchi. Abdominal: Soft, nondistended, mode tenderness to mid upper abdomen.  Bowel sounds active throughout. There are no masses palpable.  Extremities: no edema Lymphadenopathy: No cervical adenopathy noted. Neurological: Alert and oriented to person place and time. Skin: Skin is warm and dry. No rashes noted.   ASSESSMENT AND PLAN:  41. 56 year old female with a 3-year history of episodic headaches with vomiting but no acute bowel changes. Symptoms last about 24 hours.Patient feels these are  GI "bugs" . She has migraines but they  aren't usually associated with nausea / vomiting.  She has no associated fevers. . -Etiology of episodic headaches with vomiting unclear but the frequency of which they occur make viral etiology unlikely. She is treated for migraine headaches. I think a Neurology evaluation would be very helpful at this pont. One other thought is that she does miss a Paxil dose sometimes. This is one of the more shorter acting medications and missed doses can cause nausea, vomiting, and headaches in some people. I did discuss this with her. .    2. Chronic upper abdominal discomfort, especially after eating.She is s/p cholecystectomy.  . It is reasonable to proceed with upper endoscopy for further evaluation. The risks and benefits of EGD were discussed and the patient agrees to proceed. Patient has requested Dr. Havery Moros as her primary GI  3. Colon cancer screening. Patient apparently had a colonoscopy approximately 7 years ago in [place]. She cannot remember where but will try and obtain that report so we can decide when next colonoscopy is due    Tye Savoy, NP  10/27/2016, 10:31 AM  ADDENDUM 11/27/16:  Records from [facility]. Patient had a screening colonoscopy in May 2013 by Dr. Oletta Lamas. The prep was good, extent of exam to the cecum. Internal hemorrhoids were found, no polyps or tumor. Repeat colonoscopy recommended in 10 years. Will place her on recall list for 2023. Marland Kitchen

## 2016-10-27 NOTE — Patient Instructions (Signed)
If you are age 57 or older, your body mass index should be between 23-30. Your Body mass index is 30.51 kg/m. If this is out of the aforementioned range listed, please consider follow up with your Primary Care Provider.  If you are age 44 or younger, your body mass index should be between 19-25. Your Body mass index is 30.51 kg/m. If this is out of the aformentioned range listed, please consider follow up with your Primary Care Provider.   You have been scheduled for an endoscopy. Please follow written instructions given to you at your visit today. If you use inhalers (even only as needed), please bring them with you on the day of your procedure. Your physician has requested that you go to www.startemmi.com and enter the access code given to you at your visit today. This web site gives a general overview about your procedure. However, you should still follow specific instructions given to you by our office regarding your preparation for the procedure.  You may take Imodium as needed.  Thank you for choosing me and [facility].   Tye Savoy, NP

## 2016-10-30 NOTE — Progress Notes (Signed)
Agree with assessment and plan as outlined.  Hopefully with better control of her headaches, perhaps some of these other symptoms will improve. Otherwise will await upper endoscopy. Thanks

## 2016-11-01 ENCOUNTER — Telehealth: Payer: Self-pay

## 2016-11-01 ENCOUNTER — Encounter: Payer: Self-pay | Admitting: Gastroenterology

## 2016-11-01 DIAGNOSIS — L900 Lichen sclerosus et atrophicus: Secondary | ICD-10-CM | POA: Diagnosis not present

## 2016-11-01 NOTE — Telephone Encounter (Signed)
Patient called requesting a move up in date for her EGD, rescheduled from 6/14 to 6/4. Patient advised verbally what time to arrrive, hold all diabetic medication the morning of procedure.

## 2016-11-06 ENCOUNTER — Ambulatory Visit (AMBULATORY_SURGERY_CENTER): Payer: BLUE CROSS/BLUE SHIELD | Admitting: Gastroenterology

## 2016-11-06 VITALS — BP 118/85 | HR 84 | Temp 98.0°F | Resp 15 | Ht 66.0 in | Wt 189.0 lb

## 2016-11-06 DIAGNOSIS — R1010 Upper abdominal pain, unspecified: Secondary | ICD-10-CM | POA: Diagnosis not present

## 2016-11-06 DIAGNOSIS — R109 Unspecified abdominal pain: Secondary | ICD-10-CM | POA: Diagnosis not present

## 2016-11-06 MED ORDER — SODIUM CHLORIDE 0.9 % IV SOLN: 500.0000 mL | INTRAVENOUS | Status: DC

## 2016-11-06 MED ORDER — OMEPRAZOLE 40 MG PO CPDR: 40.0000 mg | DELAYED_RELEASE_CAPSULE | Freq: Every day | ORAL | 1 refills | Status: DC

## 2016-11-06 NOTE — Progress Notes (Signed)
Dental advisory given to patient 

## 2016-11-06 NOTE — Progress Notes (Signed)
Called to room to assist during endoscopic procedure.  Patient ID and intended procedure confirmed with present staff. Received instructions for my participation in the procedure from the performing physician.  

## 2016-11-06 NOTE — Progress Notes (Signed)
Pt's states no medical or surgical changes since previsit or office visit. 

## 2016-11-06 NOTE — Patient Instructions (Signed)
Pick up new prescription from your pharmacy. Take as directed. Resume current medications.  Gastroparesis handout given today. Biopsies taken today. Result letter in your mail in 2-3 weeks.  Office nurse will call you with appointment from gastric emptying study.   YOU HAD AN ENDOSCOPIC PROCEDURE TODAY AT [facility]:   Refer to the procedure report that was given to you for any specific questions about what was found during the examination.  If the procedure report does not answer your questions, please call your gastroenterologist to clarify.  If you requested that your care partner not be given the details of your procedure findings, then the procedure report has been included in a sealed envelope for you to review at your convenience later.  YOU SHOULD EXPECT: Some feelings of bloating in the abdomen. Passage of more gas than usual.  Walking can help get rid of the air that was put into your GI tract during the procedure and reduce the bloating. If you had a lower endoscopy (such as a colonoscopy or flexible sigmoidoscopy) you may notice spotting of blood in your stool or on the toilet paper. If you underwent a bowel prep for your procedure, you may not have a normal bowel movement for a few days.  Please Note:  You might notice some irritation and congestion in your nose or some drainage.  This is from the oxygen used during your procedure.  There is no need for concern and it should clear up in a day or so.  SYMPTOMS TO REPORT IMMEDIATELY:   Following upper endoscopy (EGD)  Vomiting of blood or coffee ground material  New chest pain or pain under the shoulder blades  Painful or persistently difficult swallowing  New shortness of breath  Fever of 100F or higher  Black, tarry-looking stools  For urgent or emergent issues, a gastroenterologist can be reached at any hour by calling [phone].   DIET:  We do recommend a small meal at first, but then you may proceed to  your regular diet.  Drink plenty of fluids but you should avoid alcoholic beverages for 24 hours.  ACTIVITY:  You should plan to take it easy for the rest of today and you should NOT DRIVE or use heavy machinery until tomorrow (because of the sedation medicines used during the test).    FOLLOW UP: Our staff will call the number listed on your records the next business day following your procedure to check on you and address any questions or concerns that you may have regarding the information given to you following your procedure. If we do not reach you, we will leave a message.  However, if you are feeling well and you are not experiencing any problems, there is no need to return our call.  We will assume that you have returned to your regular daily activities without incident.  If any biopsies were taken you will be contacted by phone or by letter within the next 1-3 weeks.  Please call us at [phone] if you have not heard about the biopsies in 3 weeks.    SIGNATURES/CONFIDENTIALITY: You and/or your care partner have signed paperwork which will be entered into your electronic medical record.  These signatures attest to the fact that that the information above on your After Visit Summary has been reviewed and is understood.  Full responsibility of the confidentiality of this discharge information lies with you and/or your care-partner.

## 2016-11-06 NOTE — Progress Notes (Signed)
A/ox3 pleased with MAC, report to [employer]

## 2016-11-06 NOTE — Op Note (Signed)
[facility] Patient Name: Carrie [name] Procedure Date: 11/06/2016 7:40 AM MRN: [id number] Endoscopist: Remo Lipps P. Kori Goins MD, MD Age: 56 Referring MD:  Date of Birth: [birth year] Gender: Female Account #: [id number] Procedure:                Upper GI endoscopy Indications:              postprandial epigastric abdominal pain, episodic                            vomiting Medicines:                Monitored Anesthesia Care Procedure:                Pre-Anesthesia Assessment:                           - Prior to the procedure, a History and Physical                            was performed, and patient medications and                            allergies were reviewed. The patient's tolerance of                            previous anesthesia was also reviewed. The risks                            and benefits of the procedure and the sedation                            options and risks were discussed with the patient.                            All questions were answered, and informed consent                            was obtained. Prior Anticoagulants: The patient has                            taken no previous anticoagulant or antiplatelet                            agents. ASA Grade Assessment: II - A patient with                            mild systemic disease. After reviewing the risks                            and benefits, the patient was deemed in                            satisfactory condition to undergo the procedure.  After obtaining informed consent, the endoscope was                            passed under direct vision. Throughout the                            procedure, the patient's blood pressure, pulse, and                            oxygen saturations were monitored continuously. The                            Endoscope was introduced through the mouth, and                            advanced to the antrum of the stomach. The  upper GI                            endoscopy was accomplished without difficulty. The                            patient tolerated the procedure well. Scope In: Scope Out: Findings:                 Esophagogastric landmarks were identified: the                            Z-line was found at 35 cm, the gastroesophageal                            junction was found at 35 cm and the upper extent of                            the gastric folds was found at 35 cm from the                            incisors.                           The exam of the esophagus was otherwise normal.                           A moderate to large amount of food was found in the                            gastric body and fundus which prohibited a full                            evaluation. Due to this finding and potential risk                            for aspiration the procedure was aborted without  evaluation of the small bowel                           Of what was visualized of the mucosa of the                            stomach, it was otherwise normal.                           Biopsies were taken with a cold forceps in the                            gastric body, at the incisura and in the gastric                            antrum for Helicobacter pylori testing prior to                            withdrawal of the endoscope. Complications:            No immediate complications. Estimated blood loss:                            Minimal. Estimated Blood Loss:     Estimated blood loss was minimal. Impression:               - Esophagogastric landmarks identified.                           - Normal esophagus                           - Residual food in the stomach, concerning for                            underlying gastroparesis. Full exam not completed                            due to aspiration risk, procedure aborted.                           - Biopsies were taken with a  cold forceps for                            Helicobacter pylori testing. Recommendation:           - Patient has a contact number available for                            emergencies. The signs and symptoms of potential                            delayed complications were discussed with the                            patient. Return to normal activities tomorrow.  Written discharge instructions were provided to the                            patient.                           - Resume previous diet.                           - Continue present medications.                           - Start empiric course of omeprazole 40mg  once daily                           - Schedule for gastric emptying study to assess for                            gastroparesis                           - Await pathology results.                           - Consider repeat EGD pending course given limited                            evaluation on this exam Andrae Claunch P. Graceanne Guin MD, MD 11/06/2016 7:56:48 AM This report has been signed electronically.

## 2016-11-07 ENCOUNTER — Telehealth: Payer: Self-pay

## 2016-11-07 ENCOUNTER — Telehealth: Payer: Self-pay | Admitting: *Deleted

## 2016-11-07 ENCOUNTER — Other Ambulatory Visit: Payer: Self-pay

## 2016-11-07 DIAGNOSIS — R198 Other specified symptoms and signs involving the digestive system and abdomen: Secondary | ICD-10-CM

## 2016-11-07 NOTE — Telephone Encounter (Signed)
Left message for Carrie [name] at centralized scheduling, need to schedule a gastric emptying study. Order is in [place].

## 2016-11-07 NOTE — Telephone Encounter (Signed)
  Follow up Call-  Call back number 11/06/2016  Post procedure Call Back phone  # [phone]  Permission to leave phone message Yes  Some recent data might be hidden     Patient questions:  Do you have a fever, pain , or abdominal swelling? No. Pain Score  0 *  Have you tolerated food without any problems? Yes.    Have you been able to return to your normal activities? Yes.    Do you have any questions about your discharge instructions: Diet   No. Medications  No. Follow up visit  No.  Do you have questions or concerns about your Care? No.  Actions: * If pain score is 4 or above: No action needed, pain <4.

## 2016-11-08 ENCOUNTER — Telehealth: Payer: Self-pay

## 2016-11-08 ENCOUNTER — Other Ambulatory Visit: Payer: Self-pay

## 2016-11-08 NOTE — Telephone Encounter (Signed)
-----   Message from Manus Gunning, MD sent at 11/06/2016  8:35 AM EDT ----- Regarding: scheduling GES Hi Carrie [name], Would you be able to help contact this patient and help schedule a gastric emptying study? She had an EGD today with findings suggestive of it.   Thanks much

## 2016-11-08 NOTE — Telephone Encounter (Signed)
-----   Message from Manus Gunning, MD sent at 11/06/2016  8:35 AM EDT ----- Regarding: scheduling GES Hi Almyra Free, Would you be able to help contact this patient and help schedule a gastric emptying study? She had an EGD today with findings suggestive of it.   Thanks much

## 2016-11-08 NOTE — Telephone Encounter (Signed)
Left message for patient to call our office back. I have scheduled her gastric emptying study, first available is 6/19 at [facility] arrive 7:15 for 7:30 am test. She needs to be NPO after midnight and she needs to hold her omeprazole 8 hours prior.

## 2016-11-08 NOTE — Telephone Encounter (Signed)
Patient aware of test and prep instructions.

## 2016-11-13 ENCOUNTER — Encounter: Payer: Self-pay | Admitting: Gastroenterology

## 2016-11-16 ENCOUNTER — Encounter: Payer: BLUE CROSS/BLUE SHIELD | Admitting: Gastroenterology

## 2016-11-21 ENCOUNTER — Ambulatory Visit (HOSPITAL_COMMUNITY)
Admission: RE | Admit: 2016-11-21 | Discharge: 2016-11-21 | Disposition: A | Discharge status: Home / Self Care | Payer: BLUE CROSS/BLUE SHIELD | Source: Ambulatory Visit | Attending: Gastroenterology | Admitting: Gastroenterology

## 2016-11-21 DIAGNOSIS — R198 Other specified symptoms and signs involving the digestive system and abdomen: Secondary | ICD-10-CM

## 2016-11-21 DIAGNOSIS — R112 Nausea with vomiting, unspecified: Secondary | ICD-10-CM | POA: Diagnosis not present

## 2016-11-21 DIAGNOSIS — K30 Functional dyspepsia: Secondary | ICD-10-CM | POA: Insufficient documentation

## 2016-11-21 MED ORDER — TECHNETIUM TC 99M SULFUR COLLOID
2.1000 | Freq: Once | INTRAVENOUS | Status: AC | PRN
  Administered 2016-11-21: 2.1 via INTRAVENOUS

## 2016-12-04 ENCOUNTER — Other Ambulatory Visit: Payer: Self-pay | Admitting: *Deleted

## 2016-12-04 DIAGNOSIS — G43809 Other migraine, not intractable, without status migrainosus: Secondary | ICD-10-CM

## 2016-12-04 MED ORDER — TOPIRAMATE 50 MG PO TABS: 100.0000 mg | ORAL_TABLET | Freq: Every day | ORAL | 11 refills | Status: DC

## 2017-01-09 ENCOUNTER — Encounter: Payer: Self-pay | Admitting: Physician Assistant

## 2017-01-09 ENCOUNTER — Other Ambulatory Visit: Payer: Self-pay | Admitting: Physician Assistant

## 2017-01-09 ENCOUNTER — Ambulatory Visit (INDEPENDENT_AMBULATORY_CARE_PROVIDER_SITE_OTHER): Payer: BLUE CROSS/BLUE SHIELD | Admitting: Physician Assistant

## 2017-01-09 VITALS — BP 133/86 | HR 85 | Temp 97.7°F | Ht 66.0 in | Wt 184.8 lb

## 2017-01-09 DIAGNOSIS — F324 Major depressive disorder, single episode, in partial remission: Secondary | ICD-10-CM

## 2017-01-09 DIAGNOSIS — K580 Irritable bowel syndrome with diarrhea: Secondary | ICD-10-CM

## 2017-01-09 DIAGNOSIS — E119 Type 2 diabetes mellitus without complications: Secondary | ICD-10-CM | POA: Diagnosis not present

## 2017-01-09 DIAGNOSIS — K3184 Gastroparesis: Secondary | ICD-10-CM

## 2017-01-09 LAB — BAYER DCA HB A1C WAIVED: HB A1C (BAYER DCA - WAIVED): 9.9 % — ABNORMAL HIGH (ref <7.0)

## 2017-01-09 MED ORDER — PAROXETINE HCL 30 MG PO TABS: 30.0000 mg | ORAL_TABLET | Freq: Every day | ORAL | 6 refills | Status: DC

## 2017-01-09 MED ORDER — INSULIN PEN NEEDLE 33G X 4 MM MISC: 1.0000 | Freq: Every day | 5 refills | Status: DC

## 2017-01-09 MED ORDER — LIRAGLUTIDE 18 MG/3ML ~~LOC~~ SOPN
PEN_INJECTOR | SUBCUTANEOUS | 5 refills | Status: DC
Start: 2017-01-09 — End: 2017-02-26

## 2017-01-09 MED ORDER — METFORMIN HCL ER 500 MG PO TB24: 1000.0000 mg | ORAL_TABLET | Freq: Two times a day (BID) | ORAL | 6 refills | Status: DC

## 2017-01-09 NOTE — Progress Notes (Signed)
BP 133/86   Pulse 85   Temp 97.7 F (36.5 C) (Oral)   Ht 5' 6"  (1.676 m)   Wt 184 lb 12.8 oz (83.8 kg)   BMI 29.83 kg/m    Subjective:    Patient ID: Carrie [name], female    DOB: 03/01/1961, 56 y.o.   MRN: [id number]  HPI: Carrie [name] is a 56 y.o. female presenting on 01/09/2017 for Follow-up (3 month)  This patient comes in for periodic recheck on medications and conditions including IBS with diarrhea, Type 2 DM uncontrolled, depression, gastroparesis. When she saw the gastroenterologist, and EGD had been performed. She was diagnosed with gastroparesis. She still is having some episodes of her IBS and diarrhea. She is supposed to go back and see the gastroenterologist this month. She is due labs today for her diabetes and cholesterol. All of her medications have been reviewed and refills are sent.   All medications are reviewed today. There are no reports of any problems with the medications. All of the medical conditions are reviewed and updated.  Lab work is reviewed and will be ordered as medically necessary. There are no new problems reported with today's visit.   Relevant past medical, surgical, family and social history reviewed and updated as indicated. Allergies and medications reviewed and updated.  Past Medical History:  Diagnosis Date  . Depression   . Diabetes mellitus without complication ([place])   . Fever blister   . Hyperlipidemia   . Hypertension   . Migraine     Past Surgical History:  Procedure Laterality Date  . BACK SURGERY    . CHOLECYSTECTOMY  2004    Review of Systems  Constitutional: Negative.  Negative for activity change, fatigue and fever.  HENT: Negative.   Eyes: Negative.   Respiratory: Negative.  Negative for cough.   Cardiovascular: Negative.  Negative for chest pain.  Gastrointestinal: Positive for abdominal distention, abdominal pain and diarrhea.  Endocrine: Negative.   Genitourinary: Negative.  Negative for dysuria.  Musculoskeletal:  Negative.   Skin: Negative.   Neurological: Negative.     Allergies as of 01/09/2017   No Known Allergies     Medication List       Accurate as of 01/09/17 11:12 AM. Always use your most recent med list.          Insulin Pen Needle 33G X 4 MM Misc Commonly known as:  ADVOCATE INSULIN PEN NEEDLES 1 each by Does not apply route daily. Use to inject victoza once daily   liraglutide 18 MG/3ML Sopn Commonly known as:  VICTOZA Use 1.29m daily   loratadine 10 MG tablet Commonly known as:  CLARITIN Take 1 tablet (10 mg total) by mouth daily.   metFORMIN 500 MG 24 hr tablet Commonly known as:  GLUCOPHAGE-XR Take 2 tablets (1,000 mg total) by mouth 2 (two) times daily.   omeprazole 40 MG capsule Commonly known as:  PRILOSEC Take 1 capsule (40 mg total) by mouth daily.   PARoxetine 30 MG tablet Commonly known as:  PAXIL Take 1 tablet (30 mg total) by mouth daily.   SUMAtriptan 100 MG tablet Commonly known as:  IMITREX Take 1 tablet (100 mg total) by mouth every 2 (two) hours as needed for migraine.   topiramate 50 MG tablet Commonly known as:  TOPAMAX Take 2 tablets (100 mg total) by mouth daily.   valACYclovir 1000 MG tablet Commonly known as:  VALTREX AS NEEDED  Objective:    BP 133/86   Pulse 85   Temp 97.7 F (36.5 C) (Oral)   Ht 5' 6"  (1.676 m)   Wt 184 lb 12.8 oz (83.8 kg)   BMI 29.83 kg/m   No Known Allergies  Physical Exam  Constitutional: She is oriented to person, place, and time. She appears well-developed and well-nourished.  HENT:  Head: Normocephalic and atraumatic.  Right Ear: Tympanic membrane, external ear and ear canal normal.  Left Ear: Tympanic membrane, external ear and ear canal normal.  Nose: Nose normal. No rhinorrhea.  Mouth/Throat: Oropharynx is clear and moist and mucous membranes are normal. No oropharyngeal exudate or posterior oropharyngeal erythema.  Eyes: Pupils are equal, round, and reactive to light. Conjunctivae  and EOM are normal.  Neck: Normal range of motion. Neck supple.  Cardiovascular: Normal rate, regular rhythm, normal heart sounds and intact distal pulses.   Pulmonary/Chest: Effort normal and breath sounds normal.  Abdominal: Soft. Bowel sounds are normal.  Neurological: She is alert and oriented to person, place, and time. She has normal reflexes.  Skin: Skin is warm and dry. No rash noted.  Psychiatric: She has a normal mood and affect. Her behavior is normal. Judgment and thought content normal.  Nursing note and vitals reviewed.   Results for orders placed or performed in visit on 10/09/16  Bayer DCA Hb A1c Waived  Result Value Ref Range   Bayer DCA Hb A1c Waived 9.2 (H) <7.0 %      Assessment & Plan:   1. Irritable bowel syndrome with diarrhea - CMP14+EGFR - Celiac Panel  2. Type 2 diabetes mellitus without complication, without long-term current use of insulin ([facility]) - CMP14+EGFR - Lipid panel - Bayer DCA Hb A1c Waived  3. Depression, major, single episode, in partial remission ([place])  4. Gastroparesis - Celiac Panel   Current Outpatient Prescriptions:  .  Insulin Pen Needle (ADVOCATE INSULIN PEN NEEDLES) 33G X 4 MM MISC, 1 each by Does not apply route daily. Use to inject victoza once daily, Disp: 100 each, Rfl: 5 .  liraglutide (VICTOZA) 18 MG/3ML SOPN, Use 1.29m daily, Disp: 3 mL, Rfl: 5 .  loratadine (CLARITIN) 10 MG tablet, Take 1 tablet (10 mg total) by mouth daily., Disp: 90 tablet, Rfl: 3 .  metFORMIN (GLUCOPHAGE-XR) 500 MG 24 hr tablet, Take 2 tablets (1,000 mg total) by mouth 2 (two) times daily., Disp: 60 tablet, Rfl: 6 .  omeprazole (PRILOSEC) 40 MG capsule, Take 1 capsule (40 mg total) by mouth daily., Disp: 90 capsule, Rfl: 1 .  PARoxetine (PAXIL) 30 MG tablet, Take 1 tablet (30 mg total) by mouth daily., Disp: 30 tablet, Rfl: 6 .  SUMAtriptan (IMITREX) 100 MG tablet, Take 1 tablet (100 mg total) by mouth every 2 (two) hours as needed for migraine., Disp:  10 tablet, Rfl: 6 .  topiramate (TOPAMAX) 50 MG tablet, Take 2 tablets (100 mg total) by mouth daily., Disp: 60 tablet, Rfl: 11 .  valACYclovir (VALTREX) 1000 MG tablet, AS NEEDED, Disp: , Rfl: 0  Current Facility-Administered Medications:  .  0.9 %  sodium chloride infusion, 500 mL, Intravenous, Continuous, Armbruster, SRenelda Loma MD  Continue all other maintenance medications as listed above.  Follow up plan: Return in about 3 months (around 04/11/2017).  Educational handout given for celiac disease  ATerald SleeperPA-C [id number] [place] [phone]  01/09/2017, 11:12 AM

## 2017-01-09 NOTE — Patient Instructions (Signed)
Celiac Disease Celiac disease is an allergy to the protein that is called gluten. When a person with celiac disease eats a food that has gluten in it, his or her natural defense system (immune system) attacks the cells that line the small intestine. Over time, this reaction damages the small intestine and makes the small intestine unable to absorb nutrients from food. Gluten is found in wheat, rye, and barley and in foods like pasta, pizza, and cereal. Celiac disease is also known as celiac sprue, nontropical sprue, and gluten-sensitive enteropathy. What are the causes? This condition is caused by a gene that is passed down through families (inherited). What increases the risk? This condition is more likely to develop in people who have a family member with the disease. What are the signs or symptoms? Symptoms of this condition include:  Recurring bloating and pain in the abdomen.  Gas.  Long-term (chronic) diarrhea.  Pale, bad-smelling, greasy, or oily stool.  Weight loss.  Missed menstrual periods.  Weakening bones (osteoporosis).  Fatigue and weakness.  Tingling or other signs of nerve damage.  Depression.  Poor appetite.  Rash.  In some cases, there are no symptoms. How is this diagnosed? This condition is diagnosed with a physical exam and tests. Tests may include:  Blood tests to check for nutritional deficiencies.  Blood tests to look for evidence that the body is attacking cells in the small intestine.  A test in which a sample of tissue is taken from the small bowel and examined under a microscope (biopsy).  X-rays of the bowel.  Stool tests.  Tests to check for nutrient absorption from the intestine.  How is this treated? There is no cure for this condition, but it can be managed with a gluten-free diet. Treatment may also involve avoiding dairy foods, such as milk and cheese, because they are hard to digest. Most people who follow a gluten-free diet feel  better and stop having symptoms. The intestine usually heals within 3 months to 2 years. In a small percentage of people, this condition does not improve on the gluten-free diet. If your condition does not improve, more tests will be done. You will also need to work with a specialist in celiac disease to find the best treatment for you. Follow these instructions at home:  Follow instructions from your health care provider about diet.  Monitor your body's response to the gluten-free diet. Write down any changes in your symptoms and changes in how you feel.  If you decide to eat outside of the home, prepare your meal ahead of time, or make sure that the place where you are going has gluten-free options.  Keep all follow-up visits as told by your health care provider. This is important.  Suggest to family members that they get screened for early signs of the disease. Contact a health care provider if:  You continue to have symptoms, even when you are eating a gluten-free diet.  You have trouble sticking to the gluten-free diet.  You develop an itchy rash with groups of tiny blisters.  You develop severe weakness.  You develop balance problems.  You develop new symptoms. This information is not intended to replace advice given to you by your health care provider. Make sure you discuss any questions you have with your health care provider. Document Released: 05/22/2005 Document Revised: 10/28/2015 Document Reviewed: 09/14/2014 Elsevier Interactive Patient Education  2018 Elsevier Inc.  

## 2017-01-10 LAB — LIPID PANEL
Chol/HDL Ratio: 2.8 ratio (ref 0.0–4.4)
Cholesterol, Total: 172 mg/dL (ref 100–199)
HDL: 62 mg/dL (ref >39)
LDL Calculated: 80 mg/dL (ref 0–99)
Triglycerides: 148 mg/dL (ref 0–149)
VLDL Cholesterol Cal: 30 mg/dL (ref 5–40)

## 2017-01-10 LAB — CMP14+EGFR
ALT: 21 [IU]/L (ref 0–32)
AST: 20 [IU]/L (ref 0–40)
Albumin/Globulin Ratio: 2 (ref 1.2–2.2)
Albumin: 4.5 g/dL (ref 3.5–5.5)
Alkaline Phosphatase: 72 [IU]/L (ref 39–117)
BUN/Creatinine Ratio: 17 (ref 9–23)
BUN: 10 mg/dL (ref 6–24)
Bilirubin Total: 0.6 mg/dL (ref 0.0–1.2)
CO2: 23 mmol/L (ref 20–29)
Calcium: 9.4 mg/dL (ref 8.7–10.2)
Chloride: 104 mmol/L (ref 96–106)
Creatinine, Ser: 0.6 mg/dL (ref 0.57–1.00)
GFR calc Af Amer: 118 mL/min/{1.73_m2}
GFR calc non Af Amer: 102 mL/min/{1.73_m2}
Globulin, Total: 2.3 g/dL (ref 1.5–4.5)
Glucose: 220 mg/dL — ABNORMAL HIGH (ref 65–99)
Potassium: 3.9 mmol/L (ref 3.5–5.2)
Sodium: 142 mmol/L (ref 134–144)
Total Protein: 6.8 g/dL (ref 6.0–8.5)

## 2017-01-10 LAB — GLIA (IGA/G) + TTG IGA
Antigliadin Abs, IgA: 4 units (ref 0–19)
Gliadin IgG: 2 units (ref 0–19)
Transglutaminase IgA: <2 U/mL (ref 0–3)

## 2017-01-17 ENCOUNTER — Ambulatory Visit (INDEPENDENT_AMBULATORY_CARE_PROVIDER_SITE_OTHER): Payer: BLUE CROSS/BLUE SHIELD | Admitting: Gastroenterology

## 2017-01-17 ENCOUNTER — Encounter: Payer: Self-pay | Admitting: Gastroenterology

## 2017-01-17 VITALS — BP 142/80 | HR 82 | Ht 66.0 in | Wt 183.0 lb

## 2017-01-17 DIAGNOSIS — G8929 Other chronic pain: Secondary | ICD-10-CM

## 2017-01-17 DIAGNOSIS — K529 Noninfective gastroenteritis and colitis, unspecified: Secondary | ICD-10-CM

## 2017-01-17 DIAGNOSIS — R51 Headache: Secondary | ICD-10-CM

## 2017-01-17 DIAGNOSIS — K3184 Gastroparesis: Secondary | ICD-10-CM | POA: Diagnosis not present

## 2017-01-17 DIAGNOSIS — R519 Headache, unspecified: Secondary | ICD-10-CM

## 2017-01-17 MED ORDER — COLESTIPOL HCL 1 G PO TABS: 1.0000 g | ORAL_TABLET | Freq: Two times a day (BID) | ORAL | 3 refills | Status: DC

## 2017-01-17 NOTE — Progress Notes (Signed)
HPI :  56 y/o female here for a follow up visit. She has a history of migraine headaches, referred at the last visit to Neurology. She continues to have headaches, has not seen Neurology yet.   She reports her diabetes has not been well controlled. She is on Victoza and metformin. Her A1c is 9. She reports she had severe nausea after starting Victoza, but she thinks has gotten used to it over time. She reports generally her symptoms have improved since being prilosec she is feeling better. She has some postprandial fullness after she eats which persists. She also has loose stools after she eats. This has been ongoing since her gallbladder was removed in 2003/2004. She is having roughly a few BMs in the morning and then again in the afternoon. She has some nausea but not as bad recently. She is not sure if her headaches are due to her diabetes. She thinks when glucose levels are high she has headaches. She previously had some nausea with headaches, but this has not occurred recently. She has not seen Neurology in over 15 years. Celiac testing is negative.  Colonoscopy 10/2011 - no polyps, hemorrhoids  EGD 11/06/2016 - normal esophagus, retained food in the stomach concerning for gastroparesis, small bowel not evaluated due to retained food. Biopsies taken for H pylori and negative  Gastric emptying study - 11/21/16 - mildy delayed emptying    Past Medical History:  Diagnosis Date  . Depression   . Diabetes mellitus without complication ([place])   . Fever blister   . Gastroparesis   . Hyperlipidemia   . Hypertension   . Migraine      Past Surgical History:  Procedure Laterality Date  . BACK SURGERY    . CHOLECYSTECTOMY  2004   Family History  Problem Relation Age of Onset  . Ovarian cancer Mother   . Mental illness Daughter   . Migraines Daughter   . Colon cancer Neg Hx   . Rectal cancer Neg Hx   . Throat cancer Neg Hx    Social History  Substance Use Topics  . Smoking status:  Never Smoker  . Smokeless tobacco: Never Used  . Alcohol use Yes     Comment: occasional    Current Outpatient Prescriptions  Medication Sig Dispense Refill  . Insulin Pen Needle (ADVOCATE INSULIN PEN NEEDLES) 33G X 4 MM MISC 1 each by Does not apply route daily. Use to inject victoza once daily 100 each 5  . liraglutide (VICTOZA) 18 MG/3ML SOPN Use 1.2mg  daily 3 mL 5  . loratadine (CLARITIN) 10 MG tablet Take 1 tablet (10 mg total) by mouth daily. 90 tablet 3  . metFORMIN (GLUCOPHAGE-XR) 500 MG 24 hr tablet Take 2 tablets (1,000 mg total) by mouth 2 (two) times daily. 60 tablet 6  . omeprazole (PRILOSEC) 40 MG capsule Take 1 capsule (40 mg total) by mouth daily. 90 capsule 1  . PARoxetine (PAXIL) 30 MG tablet Take 1 tablet (30 mg total) by mouth daily. 30 tablet 6  . SUMAtriptan (IMITREX) 100 MG tablet Take 1 tablet (100 mg total) by mouth every 2 (two) hours as needed for migraine. 10 tablet 6  . topiramate (TOPAMAX) 50 MG tablet Take 2 tablets (100 mg total) by mouth daily. 60 tablet 11  . valACYclovir (VALTREX) 1000 MG tablet AS NEEDED  0   Current Facility-Administered Medications  Medication Dose Route Frequency Provider Last Rate Last Dose  . 0.9 %  sodium chloride infusion  500  mL Intravenous Continuous Armbruster, Renelda Loma, MD       No Known Allergies   Review of Systems: All systems reviewed and negative except where noted in HPI.   No results found for: WBC, HGB, HCT, MCV, PLT  Celiac labs negative on 01/09/17   Physical Exam: BP (!) 142/80   Pulse 82   Ht 5\' 6"  (1.676 m)   Wt 183 lb (83 kg)   BMI 29.54 kg/m  Constitutional: Pleasant,well-developed, female in no acute distress. HEENT: Normocephalic and atraumatic. Conjunctivae are normal. No scleral icterus. Neck supple.  Cardiovascular: Normal rate, regular rhythm.  Pulmonary/chest: Effort normal and breath sounds normal. No wheezing, rales or rhonchi. Abdominal: Soft, nondistended, nontender. There are no  masses palpable. No hepatomegaly. Extremities: no edema Lymphadenopathy: No cervical adenopathy noted. Neurological: Alert and oriented to person place and time. Skin: Skin is warm and dry. No rashes noted. Psychiatric: Normal mood and affect. Behavior is normal.   ASSESSMENT AND PLAN: 56 year old female here for reassessment following issues:  Gastroparesis - I suspect this is the likely cause of her symptoms in the setting of poorly controlled diabetes. EGD did not reveal any concerning pathology other than retained food, although this limited the exam. I discussed options with her regarding gastroparesis. We need her diabetes to be better controlled and she is working with her primary care on this, she may consider endocrinology evaluation. We counseled her on a gastroparesis diet and provided her a handout on this. I discussed other options to include Reglan and domperidone, discussed risks benefits of each. I think low-dose Reglan is reasonable however she is fearful of side effects wants to avoid this if possible. We'll give her some FD [place]se to treat some dyspepsia but she can let me know if she wants to try Reglan moving forward depending on a much this is bothering her. Otherwise her nausea could be related to Victoza, referred to primary care regarding whether or not this will be continued. Consider repeating upper endoscopy if symptoms persist given limited evaluation on her last exam.  Chronic diarrhea - will give her trial of Colestid 1 g twice daily for months to see if this helps given time course seems to be related to postcholecystectomy. She can follow-up as needed if symptoms persist.  Chronic headaches - recommend she follow-up with neurology for long-term management. She feels these are related to her poorly controlled diabetes which is possible. She declined neurology evaluation of this time.  [place] Cellar, MD [facility] Pager [phone]

## 2017-01-17 NOTE — Patient Instructions (Signed)
If you are age 57 or older, your body mass index should be between 23-30. Your Body mass index is 29.54 kg/m. If this is out of the aforementioned range listed, please consider follow up with your Primary Care Provider.  If you are age 17 or younger, your body mass index should be between 19-25. Your Body mass index is 29.54 kg/m. If this is out of the aformentioned range listed, please consider follow up with your Primary Care Provider.   We have sent the following medications to your pharmacy for you to pick up at your convenience:  Colestid  Please call our office or let Caryl Pina know if after one month if  the Colestid is not helping.  Please follow up neurology and endocrinology at your convenience.  You have been given samples of FDGard today. This can be purchased over the counter. Take as box instructs.  Thank you.

## 2017-02-14 DIAGNOSIS — Z1389 Encounter for screening for other disorder: Secondary | ICD-10-CM | POA: Diagnosis not present

## 2017-02-14 DIAGNOSIS — Z124 Encounter for screening for malignant neoplasm of cervix: Secondary | ICD-10-CM | POA: Diagnosis not present

## 2017-02-14 DIAGNOSIS — R87610 Atypical squamous cells of undetermined significance on cytologic smear of cervix (ASC-US): Secondary | ICD-10-CM | POA: Diagnosis not present

## 2017-02-14 DIAGNOSIS — R87619 Unspecified abnormal cytological findings in specimens from cervix uteri: Secondary | ICD-10-CM | POA: Diagnosis not present

## 2017-02-14 DIAGNOSIS — Z1231 Encounter for screening mammogram for malignant neoplasm of breast: Secondary | ICD-10-CM | POA: Diagnosis not present

## 2017-02-14 DIAGNOSIS — Z6829 Body mass index (BMI) 29.0-29.9, adult: Secondary | ICD-10-CM | POA: Diagnosis not present

## 2017-02-14 DIAGNOSIS — Z01419 Encounter for gynecological examination (general) (routine) without abnormal findings: Secondary | ICD-10-CM | POA: Diagnosis not present

## 2017-02-14 DIAGNOSIS — Z130 Encounter for screening for diseases of the blood and blood-forming organs and certain disorders involving the immune mechanism: Secondary | ICD-10-CM | POA: Diagnosis not present

## 2017-02-14 DIAGNOSIS — L900 Lichen sclerosus et atrophicus: Secondary | ICD-10-CM | POA: Diagnosis not present

## 2017-02-26 ENCOUNTER — Encounter: Payer: Self-pay | Admitting: Physician Assistant

## 2017-02-26 ENCOUNTER — Ambulatory Visit (INDEPENDENT_AMBULATORY_CARE_PROVIDER_SITE_OTHER): Payer: BLUE CROSS/BLUE SHIELD | Admitting: Physician Assistant

## 2017-02-26 VITALS — BP 105/61 | HR 81 | Temp 98.3°F | Ht 66.0 in | Wt 185.4 lb

## 2017-02-26 DIAGNOSIS — E119 Type 2 diabetes mellitus without complications: Secondary | ICD-10-CM

## 2017-02-26 DIAGNOSIS — I10 Essential (primary) hypertension: Secondary | ICD-10-CM | POA: Diagnosis not present

## 2017-02-26 DIAGNOSIS — R42 Dizziness and giddiness: Secondary | ICD-10-CM | POA: Diagnosis not present

## 2017-02-26 DIAGNOSIS — K3184 Gastroparesis: Secondary | ICD-10-CM | POA: Diagnosis not present

## 2017-02-26 DIAGNOSIS — H9313 Tinnitus, bilateral: Secondary | ICD-10-CM

## 2017-02-26 DIAGNOSIS — K580 Irritable bowel syndrome with diarrhea: Secondary | ICD-10-CM | POA: Diagnosis not present

## 2017-02-26 DIAGNOSIS — G43809 Other migraine, not intractable, without status migrainosus: Secondary | ICD-10-CM | POA: Diagnosis not present

## 2017-02-26 DIAGNOSIS — E1159 Type 2 diabetes mellitus with other circulatory complications: Secondary | ICD-10-CM | POA: Diagnosis not present

## 2017-02-26 DIAGNOSIS — I152 Hypertension secondary to endocrine disorders: Secondary | ICD-10-CM

## 2017-02-26 MED ORDER — INSULIN DEGLUDEC-LIRAGLUTIDE 100-3.6 UNIT-MG/ML ~~LOC~~ SOPN: 16.0000 [IU] | PEN_INJECTOR | Freq: Every day | SUBCUTANEOUS | 1 refills | Status: DC

## 2017-02-26 NOTE — Patient Instructions (Signed)
Meniere Disease  Meniere disease is an inner ear disorder. It causes attacks of a spinning sensation (vertigo), dizziness, and ringing in the ear (tinnitus). It also causes hearing loss and a feeling of fullness or pressure in the ear. This is a lifelong condition, and it may get worse over time.  You may have drop attacks or severe dizziness that makes you fall. A drop attack is when you suddenly fall without losing consciousness and you quickly recover after a few seconds or minutes.  What are the causes?  This condition is caused by having too much of the fluid that is in your inner ear (endolymph). When fluid builds up in your inner ear, it affects the nerves that control balance and hearing. The reason for the fluid buildup is not known. Possible causes include:   Allergies.   An abnormal reaction of the body's defense system (autoimmune disease).   Viral infection of the inner ear.   Head injury.    What increases the risk?  You are more likely to develop this condition if:   You are older than age 40.   You have a family history of Meniere disease.   You have a history of autoimmune disease.   You have a history of migraine headaches.    What are the signs or symptoms?  Symptoms of this condition can come and go and may last for up to 4 hours at a time. Symptoms usually start in one ear. They may become more frequent and eventually involve both ears. Symptoms can include:   Fullness and pressure in your ear.   Roaring or ringing in your ear.   Vertigo and loss of balance.   Dizziness.   Decreased hearing.   Nausea and vomiting.    How is this diagnosed?  This condition is diagnosed based on:   A physical exam.   Tests , such as:  ? A hearing test (audiogram).  ? An electronystagmogram. This tests your balance nerve (vestibular nerve).  ? Imaging studies of your inner ear, such as CT scan or MRI.  ? Other balance tests, such as rotational or balance platform tests.    How is this  treated?  There is no cure for this condition, but treatment can help to manage your symptoms. Treatment may include:   A low-salt diet. Limiting salt may help to reduce fluid in the body and relieve symptoms.   Oral or injected medicines to reduce or control:  ? Vertigo.  ? Nausea.  ? Fluid retention.  ? Dizziness.   Use of an air pressure pulse generator. This is a machine that sends small pressure pulses into your ear canal.   Hearing aids.   Inner ear surgery. This is rare.    When you have symptoms, it can be helpful to lie down on a flat surface and focus your eyes on one object that does not move. Try to stay in that position until your symptoms go away.  Follow these instructions at home:  Eating and drinking   Eat the same amount of food at the same time every day, including snacks.   Do not skip meals.   Avoid caffeine.   Drink enough fluids to keep your urine clear or pale yellow.   Limit alcoholic drinks to one drink a day for non-pregnant women and 2 drinks a day for men. One drink equals 12 oz of beer, 5 oz of wine, or 1 oz of hard liquor.     Limit the salt (sodium) in your diet as told by your health care provider. Check ingredients and nutrition facts on packaged foods and beverages.   Do not eat foods that contain monosodium glutamate (MSG).  General instructions   Do not use any products that contain nicotine or tobacco, such as cigarettes and e-cigarettes. If you need help quitting, ask your health care provider.   Take over-the-counter and prescription medicines only as told by your health care provider.   Find ways to reduce or avoid stress. If you need help with this, ask your health care provider.   Do not drive if you have vertigo or dizziness.  Contact a health care provider if:   You have symptoms that last longer than 4 hours.   You have new or worse symptoms.  Get help right away if:   You have been vomiting for 24 hours.   You cannot keep fluids down.   You have  chest pain or trouble breathing.  Summary   Meniere disease is an inner ear disorder. It causes attacks of a spinning sensation (vertigo), dizziness, and ringing in the ear (tinnitus). It also causes hearing loss and a feeling of fullness or pressure in the ear.   Symptoms of this condition can come and go and may last for up to 4 hours at a time.   When you have symptoms, it can be helpful to lie down on a flat surface and focus your eyes on one object that does not move. Try to stay in that position until your symptoms go away.  This information is not intended to replace advice given to you by your health care provider. Make sure you discuss any questions you have with your health care provider.  Document Released: 05/19/2000 Document Revised: 04/12/2016 Document Reviewed: 04/12/2016  Elsevier Interactive Patient Education  2017 Elsevier Inc.

## 2017-02-27 DIAGNOSIS — I10 Essential (primary) hypertension: Secondary | ICD-10-CM

## 2017-02-27 DIAGNOSIS — E1159 Type 2 diabetes mellitus with other circulatory complications: Secondary | ICD-10-CM | POA: Insufficient documentation

## 2017-02-27 DIAGNOSIS — I152 Hypertension secondary to endocrine disorders: Secondary | ICD-10-CM | POA: Insufficient documentation

## 2017-02-27 NOTE — Progress Notes (Signed)
BP 105/61   Pulse 81   Temp 98.3 F (36.8 C) (Oral)   Ht 5' 6"  (1.676 m)   Wt 185 lb 6.4 oz (84.1 kg)   BMI 29.92 kg/m    Subjective:    Patient ID: Carrie [name], female    DOB: [birth year], 56 y.o.   MRN: [id number]  HPI: Carrie [name] is a 56 y.o. female presenting on 02/26/2017 for Follow-up (3 month ); Dizziness; Diabetes (Bs have been running high); and Hypertension (bp has been elevated and NP at work started her on lisinopril 64m)  This patient comes in for periodic recheck on medications and conditions including uncontrolled type 2 diabetes, irritable bowel, gastroparesis, chronic episodes of tinnitus and dizziness, history of migraine, hypertension. She has been seen by the nurse technician through her work. She doesn't having some elevated blood pressure readings. She was started on lisinopril 5 mg once daily. This in the appropriate medication to use for her considering she has diabetes. With her A1c having gone up even more. We are going to look at a combination medication of Victoza with TTyler Aas called Xultophy. She will start dosing at 16 units daily. She will give [facility] call next week or so to see how her readings are going. We'll plan to recheck her in 4 weeks..   All medications are reviewed today. There are no reports of any problems with the medications. All of the medical conditions are reviewed and updated.  Lab work is reviewed and will be ordered as medically necessary. There are no new problems reported with today's visit.   Relevant past medical, surgical, family and social history reviewed and updated as indicated. Allergies and medications reviewed and updated.  Past Medical History:  Diagnosis Date  . Depression   . Diabetes mellitus without complication ([place]   . Fever blister   . Gastroparesis   . Hyperlipidemia   . Hypertension   . Migraine     Past Surgical History:  Procedure Laterality Date  . BACK SURGERY    . CHOLECYSTECTOMY  2004    Review  of Systems  Constitutional: Positive for fatigue. Negative for activity change and fever.  HENT: Negative.   Eyes: Negative.   Respiratory: Negative.  Negative for cough.   Cardiovascular: Negative.  Negative for chest pain.  Gastrointestinal: Positive for abdominal distention and nausea. Negative for abdominal pain and vomiting.  Endocrine: Positive for polydipsia and polyphagia.  Genitourinary: Negative.  Negative for dysuria.  Musculoskeletal: Negative.   Skin: Negative.   Neurological: Negative.     Allergies as of 02/26/2017   No Known Allergies     Medication List       Accurate as of 02/26/17 11:59 PM. Always use your most recent med list.          colestipol 1 g tablet Commonly known as:  COLESTID Take 1 tablet (1 g total) by mouth 2 (two) times daily.   Insulin Degludec-Liraglutide 100-3.6 UNIT-MG/ML Sopn Inject 16-30 Units into the skin daily.   Insulin Pen Needle 33G X 4 MM Misc Commonly known as:  ADVOCATE INSULIN PEN NEEDLES 1 each by Does not apply route daily. Use to inject victoza once daily   lisinopril 5 MG tablet Commonly known as:  PRINIVIL,ZESTRIL Take 5 mg by mouth daily.   loratadine 10 MG tablet Commonly known as:  CLARITIN Take 1 tablet (10 mg total) by mouth daily.   metFORMIN 500 MG 24 hr tablet Commonly known as:  GLUCOPHAGE-XR Take 2 tablets (1,000 mg total) by mouth 2 (two) times daily.   omeprazole 40 MG capsule Commonly known as:  PRILOSEC Take 1 capsule (40 mg total) by mouth daily.   PARoxetine 30 MG tablet Commonly known as:  PAXIL Take 1 tablet (30 mg total) by mouth daily.   SUMAtriptan 100 MG tablet Commonly known as:  IMITREX Take 1 tablet (100 mg total) by mouth every 2 (two) hours as needed for migraine.   topiramate 50 MG tablet Commonly known as:  TOPAMAX Take 2 tablets (100 mg total) by mouth daily.   valACYclovir 1000 MG tablet Commonly known as:  VALTREX AS NEEDED            Discharge Care  Instructions        Start     Ordered   02/26/17 0000  Insulin Degludec-Liraglutide 100-3.6 UNIT-MG/ML SOPN  Daily    Question:  Supervising Provider  Answer:  Timmothy Euler   02/26/17 1534         Objective:    BP 105/61   Pulse 81   Temp 98.3 F (36.8 C) (Oral)   Ht 5' 6"  (1.676 m)   Wt 185 lb 6.4 oz (84.1 kg)   BMI 29.92 kg/m   No Known Allergies  Physical Exam  Constitutional: She is oriented to person, place, and time. She appears well-developed and well-nourished.  HENT:  Head: Normocephalic and atraumatic.  Right Ear: Tympanic membrane, external ear and ear canal normal.  Left Ear: Tympanic membrane, external ear and ear canal normal.  Nose: Nose normal. No rhinorrhea.  Mouth/Throat: Oropharynx is clear and moist and mucous membranes are normal. No oropharyngeal exudate or posterior oropharyngeal erythema.  Eyes: Pupils are equal, round, and reactive to light. Conjunctivae and EOM are normal.  Neck: Normal range of motion. Neck supple.  Cardiovascular: Normal rate, regular rhythm, normal heart sounds and intact distal pulses.   Pulmonary/Chest: Effort normal and breath sounds normal.  Abdominal: Soft. Bowel sounds are normal.  Neurological: She is alert and oriented to person, place, and time. She has normal reflexes.  Skin: Skin is warm and dry. No rash noted.  Psychiatric: She has a normal mood and affect. Her behavior is normal. Judgment and thought content normal.  Nursing note and vitals reviewed.   Results for orders placed or performed in visit on 01/09/17  CMP14+EGFR  Result Value Ref Range   Glucose 220 (H) 65 - 99 mg/dL   BUN 10 6 - 24 mg/dL   Creatinine, Ser 0.60 0.57 - 1.00 mg/dL   GFR calc non Af Amer 102 >59 mL/min/1.73   GFR calc Af Amer 118 >59 mL/min/1.73   BUN/Creatinine Ratio 17 9 - 23   Sodium 142 134 - 144 mmol/L   Potassium 3.9 3.5 - 5.2 mmol/L   Chloride 104 96 - 106 mmol/L   CO2 23 20 - 29 mmol/L   Calcium 9.4 8.7 - 10.2  mg/dL   Total Protein 6.8 6.0 - 8.5 g/dL   Albumin 4.5 3.5 - 5.5 g/dL   Globulin, Total 2.3 1.5 - 4.5 g/dL   Albumin/Globulin Ratio 2.0 1.2 - 2.2   Bilirubin Total 0.6 0.0 - 1.2 mg/dL   Alkaline Phosphatase 72 39 - 117 IU/L   AST 20 0 - 40 IU/L   ALT 21 0 - 32 IU/L  Lipid panel  Result Value Ref Range   Cholesterol, Total 172 100 - 199 mg/dL   Triglycerides 148 0 -  149 mg/dL   HDL 62 >39 mg/dL   VLDL Cholesterol Cal 30 5 - 40 mg/dL   LDL Calculated 80 0 - 99 mg/dL   Chol/HDL Ratio 2.8 0.0 - 4.4 ratio  Bayer DCA Hb A1c Waived  Result Value Ref Range   Bayer DCA Hb A1c Waived 9.9 (H) <7.0 %  Celiac Panel  Result Value Ref Range   Antigliadin Abs, IgA 4 0 - 19 units   Gliadin IgG 2 0 - 19 units   Transglutaminase IgA <2 0 - 3 U/mL      Assessment & Plan:   1. Type 2 diabetes mellitus without complication, without long-term current use of insulin (HCC) - Insulin Degludec-Liraglutide 100-3.6 UNIT-MG/ML SOPN; Inject 16-30 Units into the skin daily.  Dispense: 3 mL; Refill: 1 Discontinue Victoza Continue metformin but allowed to reduce to 500-1,000 mg daily  2. Irritable bowel syndrome with diarrhea  3. Tinnitus of both ears Consider neurology evaluation, questionable Mnire's disease?  4. Dizziness  5. Other migraine without status migrainosus, not intractable  6. Hypertension associated with diabetes ([place]) - lisinopril (PRINIVIL,ZESTRIL) 5 MG tablet; Take 5 mg by mouth daily.    Current Outpatient Prescriptions:  .  colestipol (COLESTID) 1 g tablet, Take 1 tablet (1 g total) by mouth 2 (two) times daily., Disp: 60 tablet, Rfl: 3 .  Insulin Pen Needle (ADVOCATE INSULIN PEN NEEDLES) 33G X 4 MM MISC, 1 each by Does not apply route daily. Use to inject victoza once daily, Disp: 100 each, Rfl: 5 .  lisinopril (PRINIVIL,ZESTRIL) 5 MG tablet, Take 5 mg by mouth daily., Disp: , Rfl:  .  loratadine (CLARITIN) 10 MG tablet, Take 1 tablet (10 mg total) by mouth daily., Disp: 90  tablet, Rfl: 3 .  metFORMIN (GLUCOPHAGE-XR) 500 MG 24 hr tablet, Take 2 tablets (1,000 mg total) by mouth 2 (two) times daily., Disp: 60 tablet, Rfl: 6 .  omeprazole (PRILOSEC) 40 MG capsule, Take 1 capsule (40 mg total) by mouth daily., Disp: 90 capsule, Rfl: 1 .  PARoxetine (PAXIL) 30 MG tablet, Take 1 tablet (30 mg total) by mouth daily., Disp: 30 tablet, Rfl: 6 .  SUMAtriptan (IMITREX) 100 MG tablet, Take 1 tablet (100 mg total) by mouth every 2 (two) hours as needed for migraine., Disp: 10 tablet, Rfl: 6 .  topiramate (TOPAMAX) 50 MG tablet, Take 2 tablets (100 mg total) by mouth daily., Disp: 60 tablet, Rfl: 11 .  valACYclovir (VALTREX) 1000 MG tablet, AS NEEDED, Disp: , Rfl: 0 .  Insulin Degludec-Liraglutide 100-3.6 UNIT-MG/ML SOPN, Inject 16-30 Units into the skin daily., Disp: 3 mL, Rfl: 1  Current Facility-Administered Medications:  .  0.9 %  sodium chloride infusion, 500 mL, Intravenous, Continuous, Armbruster, Carlota Raspberry, MD Continue all other maintenance medications as listed above.  Follow up plan: Return in about 4 weeks (around 03/26/2017) for recheck.  Educational handout given for [place] PA-C [facility] [address]  [place], [place] [address] [phone]   02/27/2017, 11:25 AM

## 2017-02-28 ENCOUNTER — Telehealth: Payer: Self-pay | Admitting: *Deleted

## 2017-02-28 MED ORDER — LIRAGLUTIDE 18 MG/3ML ~~LOC~~ SOPN: 1.8000 mg | PEN_INJECTOR | Freq: Every day | SUBCUTANEOUS | 1 refills | Status: DC

## 2017-02-28 NOTE — Telephone Encounter (Signed)
Started her on [place] combo of [place].  She should have victoza. So if there are samples of Tresiba, please give, using 10 units daily until the other is approved.  Is a prior auth pending?  If no tresiba, just continue the Victoza for now with the increase to 1.8 mg daily.  May have to send a separate insulin if the insurance will not pay for the combo.

## 2017-02-28 NOTE — Telephone Encounter (Signed)
Patient is out of her insulin and Insurance is not approved new medication what would you advise for her

## 2017-02-28 NOTE — Telephone Encounter (Signed)
Patient given tresbia sample and rx sent for victoza while we are waiting to get xultophy approved.

## 2017-03-12 ENCOUNTER — Telehealth: Payer: Self-pay | Admitting: Physician Assistant

## 2017-03-12 MED ORDER — LISINOPRIL 10 MG PO TABS: 10.0000 mg | ORAL_TABLET | Freq: Every day | ORAL | 2 refills | Status: DC

## 2017-03-12 NOTE — Telephone Encounter (Signed)
Pt notified of Angel's recommendation Verbalizes understanding

## 2017-03-12 NOTE — Telephone Encounter (Signed)
Pt is currently taking Lisinopril 5mg  for bp Readings this AM 132/110 at home Nurse at work checked bp Reading was 152/90 Pt also states blood glucose was 243 this AM Pt is currently taking xultophy 26 units Pt has only been taking x 1 week Please review and advise

## 2017-03-12 NOTE — Telephone Encounter (Signed)
Increase xultophy by 3 units every 3 days until the FBS is close to 100. Increase lisinopril to 10 mg, okay to call in a new script, as she will run out faster.

## 2017-03-28 ENCOUNTER — Encounter: Payer: Self-pay | Admitting: Physician Assistant

## 2017-03-28 ENCOUNTER — Ambulatory Visit (INDEPENDENT_AMBULATORY_CARE_PROVIDER_SITE_OTHER): Payer: BLUE CROSS/BLUE SHIELD | Admitting: Physician Assistant

## 2017-03-28 VITALS — BP 132/82 | HR 75 | Temp 97.8°F | Ht 66.0 in | Wt 184.6 lb

## 2017-03-28 DIAGNOSIS — Z23 Encounter for immunization: Secondary | ICD-10-CM | POA: Diagnosis not present

## 2017-03-28 DIAGNOSIS — E109 Type 1 diabetes mellitus without complications: Secondary | ICD-10-CM | POA: Diagnosis not present

## 2017-03-28 MED ORDER — INSULIN PEN NEEDLE 33G X 4 MM MISC: 1.0000 | Freq: Every day | 5 refills | Status: DC

## 2017-03-28 NOTE — Patient Instructions (Signed)
In a few days you may receive a survey in the mail or online from [employer] regarding your visit with us today. Please take a moment to fill this out. Your feedback is very important to our whole office. It can help us better understand your needs as well as improve your experience and satisfaction. Thank you for taking your time to complete it. We care about you.  Bailee Metter, PA-C  

## 2017-03-28 NOTE — Progress Notes (Signed)
BP 132/82   Pulse 75   Temp 97.8 F (36.6 C) (Oral)   Ht 5' 6"  (1.676 m)   Wt 184 lb 9.6 oz (83.7 kg)   BMI 29.80 kg/m    Subjective:    Patient ID: Carrie [name], female    DOB: 26-Jun-1960, 56 y.o.   MRN: [id number]  HPI: Carrie [name] is a 56 y.o. female presenting on 03/28/2017 for Follow-up (4 week )  4-week recheck on her Xultophy insulin and GLP combination.  She is up to 30 units.  The max dose is 50 units/day.  At that though she would be on Victoza 1.8 mg daily.  She states that overall she is feeling well.  She has not had a episode of hypoglycemia.  She is starting to feel some better in the recent few days.  We have discussed that changing her overall glucose readings can make you feel bad until the body readjust to the new normal.  I encouraged her to continue to increase by 1 unit a day until her fasting blood sugars are close to 100.  She is being followed at work with the work mid-level provider.  Blood pressure is improved.  She is down 6 pounds over the past few months.  Relevant past medical, surgical, family and social history reviewed and updated as indicated. Allergies and medications reviewed and updated.  Past Medical History:  Diagnosis Date  . Depression   . Diabetes mellitus without complication ([place])   . Fever blister   . Gastroparesis   . Hyperlipidemia   . Hypertension   . Migraine     Past Surgical History:  Procedure Laterality Date  . BACK SURGERY    . CHOLECYSTECTOMY  2004    Review of Systems  Constitutional: Negative.   HENT: Negative.   Eyes: Negative.   Respiratory: Negative.   Gastrointestinal: Negative.   Genitourinary: Negative.     Allergies as of 03/28/2017   No Known Allergies     Medication List       Accurate as of 03/28/17 10:43 AM. Always use your most recent med list.          colestipol 1 g tablet Commonly known as:  COLESTID Take 1 tablet (1 g total) by mouth 2 (two) times daily.   Insulin  Degludec-Liraglutide 100-3.6 UNIT-MG/ML Sopn Inject 16-30 Units into the skin daily.   Insulin Pen Needle 33G X 4 MM Misc Commonly known as:  ADVOCATE INSULIN PEN NEEDLES 1 each by Does not apply route daily. Use to inject medication once daily   lisinopril 10 MG tablet Commonly known as:  PRINIVIL,ZESTRIL Take 1 tablet (10 mg total) by mouth daily.   loratadine 10 MG tablet Commonly known as:  CLARITIN Take 1 tablet (10 mg total) by mouth daily.   omeprazole 40 MG capsule Commonly known as:  PRILOSEC Take 1 capsule (40 mg total) by mouth daily.   PARoxetine 30 MG tablet Commonly known as:  PAXIL Take 1 tablet (30 mg total) by mouth daily.   SUMAtriptan 100 MG tablet Commonly known as:  IMITREX Take 1 tablet (100 mg total) by mouth every 2 (two) hours as needed for migraine.   topiramate 50 MG tablet Commonly known as:  TOPAMAX Take 2 tablets (100 mg total) by mouth daily.   valACYclovir 1000 MG tablet Commonly known as:  VALTREX AS NEEDED          Objective:    BP 132/82  Pulse 75   Temp 97.8 F (36.6 C) (Oral)   Ht 5' 6"  (1.676 m)   Wt 184 lb 9.6 oz (83.7 kg)   BMI 29.80 kg/m   No Known Allergies  Physical Exam  Constitutional: She is oriented to person, place, and time. She appears well-developed and well-nourished.  HENT:  Head: Normocephalic and atraumatic.  Eyes: Pupils are equal, round, and reactive to light. Conjunctivae and EOM are normal.  Cardiovascular: Normal rate, regular rhythm, normal heart sounds and intact distal pulses.   Pulmonary/Chest: Effort normal and breath sounds normal.  Abdominal: Soft. Bowel sounds are normal.  Neurological: She is alert and oriented to person, place, and time. She has normal reflexes.  Skin: Skin is warm and dry. No rash noted.  Psychiatric: She has a normal mood and affect. Her behavior is normal. Judgment and thought content normal.  Nursing note and vitals reviewed.   Results for orders placed or  performed in visit on 01/09/17  CMP14+EGFR  Result Value Ref Range   Glucose 220 (H) 65 - 99 mg/dL   BUN 10 6 - 24 mg/dL   Creatinine, Ser 0.60 0.57 - 1.00 mg/dL   GFR calc non Af Amer 102 >59 mL/min/1.73   GFR calc Af Amer 118 >59 mL/min/1.73   BUN/Creatinine Ratio 17 9 - 23   Sodium 142 134 - 144 mmol/L   Potassium 3.9 3.5 - 5.2 mmol/L   Chloride 104 96 - 106 mmol/L   CO2 23 20 - 29 mmol/L   Calcium 9.4 8.7 - 10.2 mg/dL   Total Protein 6.8 6.0 - 8.5 g/dL   Albumin 4.5 3.5 - 5.5 g/dL   Globulin, Total 2.3 1.5 - 4.5 g/dL   Albumin/Globulin Ratio 2.0 1.2 - 2.2   Bilirubin Total 0.6 0.0 - 1.2 mg/dL   Alkaline Phosphatase 72 39 - 117 IU/L   AST 20 0 - 40 IU/L   ALT 21 0 - 32 IU/L  Lipid panel  Result Value Ref Range   Cholesterol, Total 172 100 - 199 mg/dL   Triglycerides 148 0 - 149 mg/dL   HDL 62 >39 mg/dL   VLDL Cholesterol Cal 30 5 - 40 mg/dL   LDL Calculated 80 0 - 99 mg/dL   Chol/HDL Ratio 2.8 0.0 - 4.4 ratio  Bayer DCA Hb A1c Waived  Result Value Ref Range   Bayer DCA Hb A1c Waived 9.9 (H) <7.0 %  Celiac Panel  Result Value Ref Range   Antigliadin Abs, IgA 4 0 - 19 units   Gliadin IgG 2 0 - 19 units   Transglutaminase IgA <2 0 - 3 U/mL      Assessment & Plan:   1. Type 1 diabetes mellitus without complications (HCC) - Insulin Pen Needle (ADVOCATE INSULIN PEN NEEDLES) 33G X 4 MM MISC; 1 each by Does not apply route daily. Use to inject medication once daily  Dispense: 100 each; Refill: 5  2. Need for immunization against influenza - Flu Vaccine QUAD 36+ mos IM    Current Outpatient Prescriptions:  .  Insulin Degludec-Liraglutide 100-3.6 UNIT-MG/ML SOPN, Inject 16-30 Units into the skin daily., Disp: 3 mL, Rfl: 1 .  Insulin Pen Needle (ADVOCATE INSULIN PEN NEEDLES) 33G X 4 MM MISC, 1 each by Does not apply route daily. Use to inject medication once daily, Disp: 100 each, Rfl: 5 .  lisinopril (PRINIVIL,ZESTRIL) 10 MG tablet, Take 1 tablet (10 mg total) by mouth  daily., Disp: 30 tablet, Rfl: 2 .  loratadine (CLARITIN) 10 MG tablet, Take 1 tablet (10 mg total) by mouth daily., Disp: 90 tablet, Rfl: 3 .  PARoxetine (PAXIL) 30 MG tablet, Take 1 tablet (30 mg total) by mouth daily., Disp: 30 tablet, Rfl: 6 .  SUMAtriptan (IMITREX) 100 MG tablet, Take 1 tablet (100 mg total) by mouth every 2 (two) hours as needed for migraine., Disp: 10 tablet, Rfl: 6 .  topiramate (TOPAMAX) 50 MG tablet, Take 2 tablets (100 mg total) by mouth daily., Disp: 60 tablet, Rfl: 11 .  valACYclovir (VALTREX) 1000 MG tablet, AS NEEDED, Disp: , Rfl: 0 .  colestipol (COLESTID) 1 g tablet, Take 1 tablet (1 g total) by mouth 2 (two) times daily. (Patient not taking: Reported on 03/28/2017), Disp: 60 tablet, Rfl: 3 .  omeprazole (PRILOSEC) 40 MG capsule, Take 1 capsule (40 mg total) by mouth daily. (Patient not taking: Reported on 03/28/2017), Disp: 90 capsule, Rfl: 1  Current Facility-Administered Medications:  .  0.9 %  sodium chloride infusion, 500 mL, Intravenous, Continuous, Armbruster, Carlota Raspberry, MD Continue all other maintenance medications as listed above.  Follow up plan: Return in about 2 months (around 05/28/2017) for recheck.  Educational handout given for Chase PA-C [facility] [address]  [place], [place] [address] [phone]   03/28/2017, 10:43 AM

## 2017-04-11 ENCOUNTER — Ambulatory Visit: Payer: BLUE CROSS/BLUE SHIELD | Admitting: Physician Assistant

## 2017-04-13 DIAGNOSIS — Z01419 Encounter for gynecological examination (general) (routine) without abnormal findings: Secondary | ICD-10-CM | POA: Diagnosis not present

## 2017-04-13 DIAGNOSIS — Z124 Encounter for screening for malignant neoplasm of cervix: Secondary | ICD-10-CM | POA: Diagnosis not present

## 2017-04-18 ENCOUNTER — Telehealth: Payer: Self-pay | Admitting: Physician Assistant

## 2017-04-18 DIAGNOSIS — E119 Type 2 diabetes mellitus without complications: Secondary | ICD-10-CM

## 2017-04-18 MED ORDER — INSULIN DEGLUDEC-LIRAGLUTIDE 100-3.6 UNIT-MG/ML ~~LOC~~ SOPN: 40.0000 [IU] | PEN_INJECTOR | Freq: Every day | SUBCUTANEOUS | 1 refills | Status: DC

## 2017-04-20 ENCOUNTER — Telehealth: Payer: Self-pay | Admitting: Physician Assistant

## 2017-04-20 NOTE — Telephone Encounter (Signed)
Pt notified no samples available 

## 2017-05-11 ENCOUNTER — Other Ambulatory Visit: Payer: Self-pay | Admitting: Physician Assistant

## 2017-05-11 DIAGNOSIS — F324 Major depressive disorder, single episode, in partial remission: Secondary | ICD-10-CM

## 2017-05-30 ENCOUNTER — Encounter: Payer: Self-pay | Admitting: Physician Assistant

## 2017-05-30 ENCOUNTER — Ambulatory Visit: Payer: BLUE CROSS/BLUE SHIELD | Admitting: Physician Assistant

## 2017-05-30 VITALS — BP 121/76 | HR 80 | Temp 97.2°F | Ht 66.0 in | Wt 193.4 lb

## 2017-05-30 DIAGNOSIS — I10 Essential (primary) hypertension: Secondary | ICD-10-CM

## 2017-05-30 DIAGNOSIS — E109 Type 1 diabetes mellitus without complications: Secondary | ICD-10-CM | POA: Diagnosis not present

## 2017-05-30 DIAGNOSIS — E1159 Type 2 diabetes mellitus with other circulatory complications: Secondary | ICD-10-CM | POA: Diagnosis not present

## 2017-05-30 DIAGNOSIS — F324 Major depressive disorder, single episode, in partial remission: Secondary | ICD-10-CM

## 2017-05-30 DIAGNOSIS — I152 Hypertension secondary to endocrine disorders: Secondary | ICD-10-CM

## 2017-05-30 LAB — BAYER DCA HB A1C WAIVED: HB A1C (BAYER DCA - WAIVED): 8.2 % — ABNORMAL HIGH (ref <7.0)

## 2017-05-30 MED ORDER — HYDROCODONE-HOMATROPINE 5-1.5 MG/5ML PO SYRP: 5.0000 mL | ORAL_SOLUTION | Freq: Four times a day (QID) | ORAL | 0 refills | Status: DC | PRN

## 2017-05-30 MED ORDER — OLMESARTAN MEDOXOMIL 20 MG PO TABS: 20.0000 mg | ORAL_TABLET | Freq: Every day | ORAL | 5 refills | Status: DC

## 2017-05-30 NOTE — Patient Instructions (Signed)
In a few days you may receive a survey in the mail or online from [employer] regarding your visit with us today. Please take a moment to fill this out. Your feedback is very important to our whole office. It can help us better understand your needs as well as improve your experience and satisfaction. Thank you for taking your time to complete it. We care about you.  Tyronne Blann, PA-C  

## 2017-05-30 NOTE — Progress Notes (Signed)
BP 121/76   Pulse 80   Temp (!) 97.2 F (36.2 C) (Oral)   Ht 5' 6"  (1.676 m)   Wt 193 lb 6.4 oz (87.7 kg)   BMI 31.22 kg/m    Subjective:    Patient ID: Carrie [name], female    DOB: July 05, 1960, 56 y.o.   MRN: [id number]  HPI: Carrie [name] is a 56 y.o. female presenting on 05/30/2017 for Diabetes (2 month recheck)  This patient comes back in for recheck on her diabetes.  She states that overall her numbers are slowly going down.  She is slowly titrating up the insulin.  She states that she has been trying to watch her very carefully her intake.  She denies problems with any of her other medications. Her blood pressure readings have also been up more.  And she states that she was having a cough prior to the cold that she had recently.  She has continued with a cough now.  We have discussed stopping lisinopril and starting another medication.  Refills will be sent on her medications. Relevant past medical, surgical, family and social history reviewed and updated as indicated. Allergies and medications reviewed and updated.  Past Medical History:  Diagnosis Date  . Depression   . Diabetes mellitus without complication ([place])   . Fever blister   . Gastroparesis   . Hyperlipidemia   . Hypertension   . Migraine     Past Surgical History:  Procedure Laterality Date  . BACK SURGERY    . CHOLECYSTECTOMY  2004    Review of Systems  Constitutional: Negative.  Negative for activity change, fatigue and fever.  HENT: Negative.   Eyes: Negative.   Respiratory: Negative.  Negative for cough.   Cardiovascular: Negative.  Negative for chest pain.  Gastrointestinal: Negative.  Negative for abdominal pain.  Endocrine: Negative.   Genitourinary: Negative.  Negative for dysuria.  Musculoskeletal: Negative.   Skin: Negative.   Neurological: Negative.     Allergies as of 05/30/2017   No Known Allergies     Medication List        Accurate as of 05/30/17  1:57 PM. Always use your  most recent med list.          benzonatate 100 MG capsule Commonly known as:  TESSALON   HYDROcodone-homatropine 5-1.5 MG/5ML syrup Commonly known as:  HYCODAN Take 5-10 mLs by mouth every 6 (six) hours as needed for cough.   Insulin Degludec-Liraglutide 100-3.6 UNIT-MG/ML Sopn Inject 40-60 Units daily into the skin.   Insulin Pen Needle 33G X 4 MM Misc Commonly known as:  ADVOCATE INSULIN PEN NEEDLES 1 each by Does not apply route daily. Use to inject medication once daily   loratadine 10 MG tablet Commonly known as:  CLARITIN Take 1 tablet (10 mg total) by mouth daily.   olmesartan 20 MG tablet Commonly known as:  BENICAR Take 1 tablet (20 mg total) by mouth daily.   PARoxetine 30 MG tablet Commonly known as:  PAXIL TAKE 1 TABLET BY MOUTH EVERY DAY   SUMAtriptan 100 MG tablet Commonly known as:  IMITREX Take 1 tablet (100 mg total) by mouth every 2 (two) hours as needed for migraine.   topiramate 50 MG tablet Commonly known as:  TOPAMAX Take 2 tablets (100 mg total) by mouth daily.   valACYclovir 1000 MG tablet Commonly known as:  VALTREX AS NEEDED          Objective:    BP 121/76  Pulse 80   Temp (!) 97.2 F (36.2 C) (Oral)   Ht 5' 6"  (1.676 m)   Wt 193 lb 6.4 oz (87.7 kg)   BMI 31.22 kg/m   No Known Allergies  Physical Exam  Constitutional: She is oriented to person, place, and time. She appears well-developed and well-nourished.  HENT:  Head: Normocephalic and atraumatic.  Right Ear: Tympanic membrane, external ear and ear canal normal.  Left Ear: Tympanic membrane, external ear and ear canal normal.  Nose: Nose normal. No rhinorrhea.  Mouth/Throat: Oropharynx is clear and moist and mucous membranes are normal. No oropharyngeal exudate or posterior oropharyngeal erythema.  Eyes: Conjunctivae and EOM are normal. Pupils are equal, round, and reactive to light.  Neck: Normal range of motion. Neck supple.  Cardiovascular: Normal rate, regular  rhythm, normal heart sounds and intact distal pulses.  Pulmonary/Chest: Effort normal and breath sounds normal.  Abdominal: Soft. Bowel sounds are normal.  Neurological: She is alert and oriented to person, place, and time. She has normal reflexes.  Skin: Skin is warm and dry. No rash noted.  Psychiatric: She has a normal mood and affect. Her behavior is normal. Judgment and thought content normal.    Results for orders placed or performed in visit on 05/30/17  Bayer DCA Hb A1c Waived  Result Value Ref Range   Bayer DCA Hb A1c Waived 8.2 (H) <7.0 %      Assessment & Plan:   1. Type 1 diabetes mellitus without complications (HCC) - MAU63+FHLK - CBC with Differential/Platelet - Lipid panel - Bayer DCA Hb A1c Waived  2. Depression, major, single episode, in partial remission ([place])  3. Hypertension associated with diabetes ([place]) - olmesartan (BENICAR) 20 MG tablet; Take 1 tablet (20 mg total) by mouth daily.  Dispense: 30 tablet; Refill: 5 - CMP14+EGFR - CBC with Differential/Platelet - Lipid panel - Bayer DCA Hb A1c Waived    Current Outpatient Medications:  .  benzonatate (TESSALON) 100 MG capsule, , Disp: , Rfl: 0 .  HYDROcodone-homatropine (HYCODAN) 5-1.5 MG/5ML syrup, Take 5-10 mLs by mouth every 6 (six) hours as needed for cough., Disp: 240 mL, Rfl: 0 .  Insulin Degludec-Liraglutide 100-3.6 UNIT-MG/ML SOPN, Inject 40-60 Units daily into the skin., Disp: 6 mL, Rfl: 1 .  Insulin Pen Needle (ADVOCATE INSULIN PEN NEEDLES) 33G X 4 MM MISC, 1 each by Does not apply route daily. Use to inject medication once daily, Disp: 100 each, Rfl: 5 .  loratadine (CLARITIN) 10 MG tablet, Take 1 tablet (10 mg total) by mouth daily., Disp: 90 tablet, Rfl: 3 .  olmesartan (BENICAR) 20 MG tablet, Take 1 tablet (20 mg total) by mouth daily., Disp: 30 tablet, Rfl: 5 .  PARoxetine (PAXIL) 30 MG tablet, TAKE 1 TABLET BY MOUTH EVERY DAY, Disp: 30 tablet, Rfl: 4 .  SUMAtriptan (IMITREX) 100 MG tablet,  Take 1 tablet (100 mg total) by mouth every 2 (two) hours as needed for migraine., Disp: 10 tablet, Rfl: 6 .  topiramate (TOPAMAX) 50 MG tablet, Take 2 tablets (100 mg total) by mouth daily., Disp: 60 tablet, Rfl: 11 .  valACYclovir (VALTREX) 1000 MG tablet, AS NEEDED, Disp: , Rfl: 0  Current Facility-Administered Medications:  .  0.9 %  sodium chloride infusion, 500 mL, Intravenous, Continuous, [place], Carlota Raspberry, MD Continue all other maintenance medications as listed above.  Follow up plan: No Follow-up on file.  Educational handout given for [place] PA-C [place]  [place], [place] [address] [phone]   05/30/2017, 1:57 PM

## 2017-05-31 LAB — CBC WITH DIFFERENTIAL/PLATELET
Basophils Absolute: 0 10*3/uL (ref 0.0–0.2)
Basos: 0 %
EOS (ABSOLUTE): 0.2 10*3/uL (ref 0.0–0.4)
Eos: 2 %
Hematocrit: 39.8 % (ref 34.0–46.6)
Hemoglobin: 12.9 g/dL (ref 11.1–15.9)
Immature Grans (Abs): 0 10*3/uL (ref 0.0–0.1)
Immature Granulocytes: 0 %
Lymphocytes Absolute: 3.5 10*3/uL — ABNORMAL HIGH (ref 0.7–3.1)
Lymphs: 30 %
MCH: 30.1 pg (ref 26.6–33.0)
MCHC: 32.4 g/dL (ref 31.5–35.7)
MCV: 93 fL (ref 79–97)
Monocytes Absolute: 0.7 10*3/uL (ref 0.1–0.9)
Monocytes: 6 %
Neutrophils Absolute: 7.2 10*3/uL — ABNORMAL HIGH (ref 1.4–7.0)
Neutrophils: 62 %
Platelets: 481 10*3/uL — ABNORMAL HIGH (ref 150–379)
RBC: 4.29 x10E6/uL (ref 3.77–5.28)
RDW: 13.3 % (ref 12.3–15.4)
WBC: 11.7 10*3/uL — ABNORMAL HIGH (ref 3.4–10.8)

## 2017-05-31 LAB — CMP14+EGFR
ALT: 16 IU/L (ref 0–32)
AST: 19 IU/L (ref 0–40)
Albumin/Globulin Ratio: 1.8 (ref 1.2–2.2)
Albumin: 4.2 g/dL (ref 3.5–5.5)
Alkaline Phosphatase: 75 IU/L (ref 39–117)
BUN/Creatinine Ratio: 23 (ref 9–23)
BUN: 16 mg/dL (ref 6–24)
Bilirubin Total: 0.5 mg/dL (ref 0.0–1.2)
CO2: 20 mmol/L (ref 20–29)
Calcium: 9 mg/dL (ref 8.7–10.2)
Chloride: 103 mmol/L (ref 96–106)
Creatinine, Ser: 0.7 mg/dL (ref 0.57–1.00)
GFR calc Af Amer: 111 mL/min/{1.73_m2} (ref >59)
GFR calc non Af Amer: 96 mL/min/{1.73_m2} (ref >59)
Globulin, Total: 2.4 g/dL (ref 1.5–4.5)
Glucose: 180 mg/dL — ABNORMAL HIGH (ref 65–99)
Potassium: 4.1 mmol/L (ref 3.5–5.2)
Sodium: 141 mmol/L (ref 134–144)
Total Protein: 6.6 g/dL (ref 6.0–8.5)

## 2017-05-31 LAB — LIPID PANEL
Chol/HDL Ratio: 3.2 ratio (ref 0.0–4.4)
Cholesterol, Total: 178 mg/dL (ref 100–199)
HDL: 55 mg/dL (ref >39)
LDL Calculated: 88 mg/dL (ref 0–99)
Triglycerides: 173 mg/dL — ABNORMAL HIGH (ref 0–149)
VLDL Cholesterol Cal: 35 mg/dL (ref 5–40)

## 2017-06-28 ENCOUNTER — Other Ambulatory Visit: Payer: Self-pay | Admitting: Physician Assistant

## 2017-06-28 DIAGNOSIS — E119 Type 2 diabetes mellitus without complications: Secondary | ICD-10-CM

## 2017-07-29 ENCOUNTER — Other Ambulatory Visit: Payer: Self-pay | Admitting: Physician Assistant

## 2017-07-29 DIAGNOSIS — E119 Type 2 diabetes mellitus without complications: Secondary | ICD-10-CM

## 2017-08-21 ENCOUNTER — Other Ambulatory Visit: Payer: Self-pay | Admitting: Physician Assistant

## 2017-08-21 ENCOUNTER — Telehealth: Payer: Self-pay | Admitting: *Deleted

## 2017-08-21 MED ORDER — TELMISARTAN 40 MG PO TABS: 40.0000 mg | ORAL_TABLET | Freq: Every day | ORAL | 11 refills | Status: DC

## 2017-08-21 NOTE — Telephone Encounter (Signed)
Pt aware in change of medication

## 2017-08-21 NOTE — Telephone Encounter (Signed)
telmisartan 40 mg one daily is sent to [facility]

## 2017-08-21 NOTE — Telephone Encounter (Signed)
Fax received [facility] request Olmesartan 20 mg tab on back order Please advise

## 2017-09-02 ENCOUNTER — Other Ambulatory Visit: Payer: Self-pay | Admitting: Physician Assistant

## 2017-09-02 DIAGNOSIS — E119 Type 2 diabetes mellitus without complications: Secondary | ICD-10-CM

## 2017-09-04 ENCOUNTER — Other Ambulatory Visit: Payer: Self-pay | Admitting: *Deleted

## 2017-09-04 DIAGNOSIS — E119 Type 2 diabetes mellitus without complications: Secondary | ICD-10-CM

## 2017-09-04 MED ORDER — INSULIN DEGLUDEC-LIRAGLUTIDE 100-3.6 UNIT-MG/ML ~~LOC~~ SOPN: 40.0000 [IU] | PEN_INJECTOR | Freq: Every day | SUBCUTANEOUS | 0 refills | Status: DC

## 2017-09-07 ENCOUNTER — Other Ambulatory Visit: Payer: Self-pay | Admitting: Physician Assistant

## 2017-09-07 DIAGNOSIS — G43809 Other migraine, not intractable, without status migrainosus: Secondary | ICD-10-CM

## 2017-10-06 ENCOUNTER — Other Ambulatory Visit: Payer: Self-pay | Admitting: Physician Assistant

## 2017-10-06 DIAGNOSIS — E119 Type 2 diabetes mellitus without complications: Secondary | ICD-10-CM

## 2017-10-06 NOTE — Telephone Encounter (Signed)
Last A1C was done December 2018. Please advise on refill of Xultophy at [facility].

## 2017-10-08 NOTE — Telephone Encounter (Signed)
Aware that refill sent to pharmacy

## 2017-11-01 ENCOUNTER — Other Ambulatory Visit: Payer: Self-pay | Admitting: Family

## 2017-11-01 DIAGNOSIS — E119 Type 2 diabetes mellitus without complications: Secondary | ICD-10-CM

## 2017-11-13 ENCOUNTER — Encounter: Payer: Self-pay | Admitting: Physician Assistant

## 2017-11-13 ENCOUNTER — Ambulatory Visit: Payer: BLUE CROSS/BLUE SHIELD | Admitting: Physician Assistant

## 2017-11-13 VITALS — BP 116/73 | HR 79 | Temp 97.7°F | Ht 66.0 in | Wt 197.0 lb

## 2017-11-13 DIAGNOSIS — E109 Type 1 diabetes mellitus without complications: Secondary | ICD-10-CM | POA: Diagnosis not present

## 2017-11-13 DIAGNOSIS — G43809 Other migraine, not intractable, without status migrainosus: Secondary | ICD-10-CM | POA: Diagnosis not present

## 2017-11-13 DIAGNOSIS — R635 Abnormal weight gain: Secondary | ICD-10-CM | POA: Diagnosis not present

## 2017-11-13 LAB — BAYER DCA HB A1C WAIVED: HB A1C (BAYER DCA - WAIVED): 7.6 % — ABNORMAL HIGH (ref <7.0)

## 2017-11-13 MED ORDER — FLUTICASONE PROPIONATE 50 MCG/ACT NA SUSP: 2.0000 | Freq: Every day | NASAL | 11 refills | Status: DC

## 2017-11-13 MED ORDER — OMEPRAZOLE 40 MG PO CPDR: 40.0000 mg | DELAYED_RELEASE_CAPSULE | Freq: Every day | ORAL | 11 refills | Status: DC

## 2017-11-13 MED ORDER — NALTREXONE-BUPROPION HCL ER 8-90 MG PO TB12: 1.0000 | ORAL_TABLET | Freq: Two times a day (BID) | ORAL | 1 refills | Status: DC

## 2017-11-13 MED ORDER — TELMISARTAN-HCTZ 40-12.5 MG PO TABS: 1.0000 | ORAL_TABLET | Freq: Every day | ORAL | 5 refills | Status: DC

## 2017-11-13 MED ORDER — INSULIN DEGLUDEC-LIRAGLUTIDE 100-3.6 UNIT-MG/ML ~~LOC~~ SOPN: 50.0000 [IU] | PEN_INJECTOR | Freq: Every day | SUBCUTANEOUS | 11 refills | Status: DC

## 2017-11-13 MED ORDER — SUMATRIPTAN SUCCINATE 100 MG PO TABS: ORAL_TABLET | ORAL | 11 refills | Status: DC

## 2017-11-13 NOTE — Progress Notes (Signed)
   BP 116/73   Pulse 79   Temp 97.7 F (36.5 C) (Oral)   Ht 5' 6" (1.676 m)   Wt 197 lb (89.4 kg)   BMI 31.80 kg/m    Subjective:    Patient ID: Carrie [name], female    DOB: 01/11/1961, 56 y.o.   MRN: [id number]  HPI: Carrie [name] is a 56 y.o. female presenting on 11/13/2017 for Hypertension and Diabetes  This patient comes in for 3-month recheck on her diabetes.  She has gotten up to 50 units daily of her Xultophy. She is tolerating it well.  Her nurse at work sees her on a fairly regular basis but in the last few weeks she has been out of work and so therefore the patient has not been followed as closely.  She was started with Saxenda by the nurse, a sample was given.  The patient states she tried it for couple weeks and could not tell a difference.  She only stayed on the lower dose.  In the past she has been on Topamax for migraines, and this did not decrease her appetite.  She is also been to a clinic that prescribed phentermine and growth hormone.  She did not take the court growth hormone.  She states that phentermine did not make much difference on her appetite.  When she took metformin she did not have a decrease in her appetite.  Past Medical History:  Diagnosis Date  . Depression   . Diabetes mellitus without complication (HCC)   . Fever blister   . Gastroparesis   . Hyperlipidemia   . Hypertension   . Migraine    Relevant past medical, surgical, family and social history reviewed and updated as indicated. Interim medical history since our last visit reviewed. Allergies and medications reviewed and updated. DATA REVIEWED: CHART IN EPIC  Family History reviewed for pertinent findings.  Review of Systems  Constitutional: Negative.   HENT: Negative.   Eyes: Negative.   Respiratory: Negative.   Gastrointestinal: Negative.   Genitourinary: Negative.     Allergies as of 11/13/2017   No Known Allergies     Medication List        Accurate as of 11/13/17  1:52 PM.  Always use your most recent med list.          fluticasone 50 MCG/ACT nasal spray Commonly known as:  FLONASE Place 2 sprays into both nostrils daily.   Insulin Degludec-Liraglutide 100-3.6 UNIT-MG/ML Sopn Commonly known as:  XULTOPHY Inject 50-70 Units into the skin daily.   Insulin Pen Needle 33G X 4 MM Misc Commonly known as:  ADVOCATE INSULIN PEN NEEDLES 1 each by Does not apply route daily. Use to inject medication once daily   loratadine 10 MG tablet Commonly known as:  CLARITIN Take 1 tablet (10 mg total) by mouth daily.   Naltrexone-buPROPion HCl ER 8-90 MG Tb12 Commonly known as:  CONTRAVE Take 1 tablet by mouth 2 (two) times daily.   olmesartan 20 MG tablet Commonly known as:  BENICAR Take 1 tablet (20 mg total) by mouth daily.   omeprazole 40 MG capsule Commonly known as:  PRILOSEC Take 1 capsule (40 mg total) by mouth daily.   PARoxetine 30 MG tablet Commonly known as:  PAXIL TAKE 1 TABLET BY MOUTH EVERY DAY   SUMAtriptan 100 MG tablet Commonly known as:  IMITREX TAKE 1 TABLET EVERY 2 HOURS AS NEEDED FOR MIGRAINES   telmisartan-hydrochlorothiazide 40-12.5 MG tablet Commonly known   as:  MICARDIS HCT Take 1 tablet by mouth daily.   valACYclovir 1000 MG tablet Commonly known as:  VALTREX AS NEEDED          Objective:    BP 116/73   Pulse 79   Temp 97.7 F (36.5 C) (Oral)   Ht 5' 6" (1.676 m)   Wt 197 lb (89.4 kg)   BMI 31.80 kg/m   No Known Allergies  Wt Readings from Last 3 Encounters:  11/13/17 197 lb (89.4 kg)  05/30/17 193 lb 6.4 oz (87.7 kg)  03/28/17 184 lb 9.6 oz (83.7 kg)    Physical Exam  Constitutional: She is oriented to person, place, and time. She appears well-developed and well-nourished.  HENT:  Head: Normocephalic and atraumatic.  Eyes: Pupils are equal, round, and reactive to light. Conjunctivae and EOM are normal.  Cardiovascular: Normal rate, regular rhythm, normal heart sounds and intact distal pulses.    Pulmonary/Chest: Effort normal and breath sounds normal.  Abdominal: Soft. Bowel sounds are normal.  Neurological: She is alert and oriented to person, place, and time. She has normal reflexes.  Skin: Skin is warm and dry. No rash noted.  Psychiatric: She has a normal mood and affect. Her behavior is normal. Judgment and thought content normal.    Results for orders placed or performed in visit on 11/13/17  Bayer DCA Hb A1c Waived  Result Value Ref Range   HB A1C (BAYER DCA - WAIVED) 7.6 (H) <7.0 %      Assessment & Plan:   1. Other migraine without status migrainosus, not intractable - SUMAtriptan (IMITREX) 100 MG tablet; TAKE 1 TABLET EVERY 2 HOURS AS NEEDED FOR MIGRAINES  Dispense: 10 tablet; Refill: 11  2. Type 1 diabetes mellitus without complications (HCC) - Insulin Degludec-Liraglutide (XULTOPHY) 100-3.6 UNIT-MG/ML SOPN; Inject 50-70 Units into the skin daily.  Dispense: 3 mL; Refill: 11 - CBC with Differential/Platelet - CMP14+EGFR - Lipid panel - Bayer DCA Hb A1c Waived  3. Weight gain - Naltrexone-buPROPion HCl ER (CONTRAVE) 8-90 MG TB12; Take 1 tablet by mouth 2 (two) times daily.  Dispense: 60 tablet; Refill: 1  4. Morbid obesity (HCC) - Naltrexone-buPROPion HCl ER (CONTRAVE) 8-90 MG TB12; Take 1 tablet by mouth 2 (two) times daily.  Dispense: 60 tablet; Refill: 1    Continue all other maintenance medications as listed above.  Follow up plan: Return in about 1 month (around 12/11/2017) for recheck.  Educational handout given for survey  Angel S. Jones PA-C [facility] [address]  [place], [place] [address] [phone]   11/13/2017, 1:52 PM  

## 2017-11-14 LAB — CBC WITH DIFFERENTIAL/PLATELET
Basophils Absolute: 0 10*3/uL (ref 0.0–0.2)
Basos: 0 %
EOS (ABSOLUTE): 0.3 10*3/uL (ref 0.0–0.4)
Eos: 3 %
Hematocrit: 38.8 % (ref 34.0–46.6)
Hemoglobin: 13 g/dL (ref 11.1–15.9)
Immature Grans (Abs): 0 10*3/uL (ref 0.0–0.1)
Immature Granulocytes: 0 %
Lymphocytes Absolute: 2.6 10*3/uL (ref 0.7–3.1)
Lymphs: 31 %
MCH: 30.1 pg (ref 26.6–33.0)
MCHC: 33.5 g/dL (ref 31.5–35.7)
MCV: 90 fL (ref 79–97)
Monocytes Absolute: 0.6 10*3/uL (ref 0.1–0.9)
Monocytes: 7 %
Neutrophils Absolute: 5.1 10*3/uL (ref 1.4–7.0)
Neutrophils: 59 %
Platelets: 465 10*3/uL — ABNORMAL HIGH (ref 150–450)
RBC: 4.32 x10E6/uL (ref 3.77–5.28)
RDW: 13.9 % (ref 12.3–15.4)
WBC: 8.6 10*3/uL (ref 3.4–10.8)

## 2017-11-14 LAB — CMP14+EGFR
ALT: 17 IU/L (ref 0–32)
AST: 25 IU/L (ref 0–40)
Albumin/Globulin Ratio: 2 (ref 1.2–2.2)
Albumin: 4.3 g/dL (ref 3.5–5.5)
Alkaline Phosphatase: 65 IU/L (ref 39–117)
BUN/Creatinine Ratio: 25 — ABNORMAL HIGH (ref 9–23)
BUN: 15 mg/dL (ref 6–24)
Bilirubin Total: 0.7 mg/dL (ref 0.0–1.2)
CO2: 21 mmol/L (ref 20–29)
Calcium: 9.3 mg/dL (ref 8.7–10.2)
Chloride: 105 mmol/L (ref 96–106)
Creatinine, Ser: 0.61 mg/dL (ref 0.57–1.00)
GFR calc Af Amer: 116 mL/min/{1.73_m2} (ref >59)
GFR calc non Af Amer: 101 mL/min/{1.73_m2} (ref >59)
Globulin, Total: 2.1 g/dL (ref 1.5–4.5)
Glucose: 137 mg/dL — ABNORMAL HIGH (ref 65–99)
Potassium: 4.3 mmol/L (ref 3.5–5.2)
Sodium: 144 mmol/L (ref 134–144)
Total Protein: 6.4 g/dL (ref 6.0–8.5)

## 2017-11-14 LAB — LIPID PANEL
Chol/HDL Ratio: 3 ratio (ref 0.0–4.4)
Cholesterol, Total: 178 mg/dL (ref 100–199)
HDL: 60 mg/dL (ref >39)
LDL Calculated: 104 mg/dL — ABNORMAL HIGH (ref 0–99)
Triglycerides: 72 mg/dL (ref 0–149)
VLDL Cholesterol Cal: 14 mg/dL (ref 5–40)

## 2017-12-03 ENCOUNTER — Other Ambulatory Visit: Payer: Self-pay | Admitting: Physician Assistant

## 2017-12-03 DIAGNOSIS — E119 Type 2 diabetes mellitus without complications: Secondary | ICD-10-CM

## 2017-12-12 ENCOUNTER — Telehealth: Payer: Self-pay | Admitting: Physician Assistant

## 2017-12-12 ENCOUNTER — Ambulatory Visit: Payer: BLUE CROSS/BLUE SHIELD | Admitting: Physician Assistant

## 2017-12-12 NOTE — Telephone Encounter (Signed)
Pt states that the Contrave that was prescribed was finally approved by insurance but in order for her to use her coupon it needs to be written for 70 or more for her to be able to use it

## 2017-12-13 ENCOUNTER — Other Ambulatory Visit: Payer: Self-pay | Admitting: Physician Assistant

## 2017-12-13 DIAGNOSIS — R635 Abnormal weight gain: Secondary | ICD-10-CM

## 2017-12-13 NOTE — Telephone Encounter (Signed)
Does this mean a quantity of 70?

## 2017-12-14 ENCOUNTER — Other Ambulatory Visit: Payer: Self-pay | Admitting: Physician Assistant

## 2017-12-14 DIAGNOSIS — R635 Abnormal weight gain: Secondary | ICD-10-CM

## 2017-12-14 MED ORDER — NALTREXONE-BUPROPION HCL ER 8-90 MG PO TB12: 1.0000 | ORAL_TABLET | Freq: Three times a day (TID) | ORAL | 1 refills | Status: DC

## 2017-12-14 NOTE — Telephone Encounter (Signed)
Sent script for #90, take TID.  But just take BID to start

## 2017-12-14 NOTE — Telephone Encounter (Signed)
Patient aware.

## 2017-12-14 NOTE — Telephone Encounter (Signed)
Patient states that she can not use the coupon unless it is for 70 or more pills.

## 2017-12-27 DIAGNOSIS — H5210 Myopia, unspecified eye: Secondary | ICD-10-CM | POA: Diagnosis not present

## 2018-02-22 ENCOUNTER — Other Ambulatory Visit: Payer: Self-pay | Admitting: Physician Assistant

## 2018-02-22 DIAGNOSIS — F324 Major depressive disorder, single episode, in partial remission: Secondary | ICD-10-CM

## 2018-03-13 DIAGNOSIS — Z1389 Encounter for screening for other disorder: Secondary | ICD-10-CM | POA: Diagnosis not present

## 2018-03-13 DIAGNOSIS — Z01419 Encounter for gynecological examination (general) (routine) without abnormal findings: Secondary | ICD-10-CM | POA: Diagnosis not present

## 2018-03-13 DIAGNOSIS — Z124 Encounter for screening for malignant neoplasm of cervix: Secondary | ICD-10-CM | POA: Diagnosis not present

## 2018-03-13 DIAGNOSIS — Z130 Encounter for screening for diseases of the blood and blood-forming organs and certain disorders involving the immune mechanism: Secondary | ICD-10-CM | POA: Diagnosis not present

## 2018-03-13 DIAGNOSIS — Z1231 Encounter for screening mammogram for malignant neoplasm of breast: Secondary | ICD-10-CM | POA: Diagnosis not present

## 2018-03-19 ENCOUNTER — Other Ambulatory Visit: Payer: Self-pay | Admitting: Obstetrics and Gynecology

## 2018-03-19 DIAGNOSIS — N6320 Unspecified lump in the left breast, unspecified quadrant: Secondary | ICD-10-CM

## 2018-03-22 ENCOUNTER — Ambulatory Visit
Admission: RE | Admit: 2018-03-22 | Discharge: 2018-03-22 | Disposition: A | Discharge status: Home / Self Care | Payer: BLUE CROSS/BLUE SHIELD | Source: Ambulatory Visit | Attending: Obstetrics and Gynecology | Admitting: Obstetrics and Gynecology

## 2018-03-22 ENCOUNTER — Other Ambulatory Visit: Payer: Self-pay | Admitting: Obstetrics and Gynecology

## 2018-03-22 ENCOUNTER — Telehealth: Payer: Self-pay | Admitting: Physician Assistant

## 2018-03-22 DIAGNOSIS — E119 Type 2 diabetes mellitus without complications: Secondary | ICD-10-CM

## 2018-03-22 DIAGNOSIS — N6489 Other specified disorders of breast: Secondary | ICD-10-CM

## 2018-03-22 DIAGNOSIS — R928 Other abnormal and inconclusive findings on diagnostic imaging of breast: Secondary | ICD-10-CM | POA: Diagnosis not present

## 2018-03-22 DIAGNOSIS — N6320 Unspecified lump in the left breast, unspecified quadrant: Secondary | ICD-10-CM

## 2018-03-22 NOTE — Telephone Encounter (Signed)
Wants a Call back ASAP on her Insulin Degludec-Liraglutide (XULTOPHY) 100-3.6 UNIT-MG/ML SOPN she is completely out! Do we have samples?

## 2018-03-22 NOTE — Telephone Encounter (Signed)
Per Carrie [name]  We will give 1 sample of [place] to last her  until reg insulin is covered.  Sample up front

## 2018-04-02 ENCOUNTER — Telehealth: Payer: Self-pay

## 2018-04-02 ENCOUNTER — Other Ambulatory Visit: Payer: Self-pay | Admitting: Physician Assistant

## 2018-04-02 ENCOUNTER — Encounter: Payer: Self-pay | Admitting: Physician Assistant

## 2018-04-02 ENCOUNTER — Ambulatory Visit: Payer: BLUE CROSS/BLUE SHIELD | Admitting: Physician Assistant

## 2018-04-02 VITALS — BP 113/65 | HR 87 | Ht 66.0 in | Wt 197.0 lb

## 2018-04-02 DIAGNOSIS — Z23 Encounter for immunization: Secondary | ICD-10-CM | POA: Diagnosis not present

## 2018-04-02 DIAGNOSIS — E109 Type 1 diabetes mellitus without complications: Secondary | ICD-10-CM

## 2018-04-02 LAB — BAYER DCA HB A1C WAIVED: HB A1C (BAYER DCA - WAIVED): 8.1 % — ABNORMAL HIGH (ref <7.0)

## 2018-04-02 MED ORDER — INSULIN GLARGINE 100 UNIT/ML SOLOSTAR PEN: 30.0000 [IU] | PEN_INJECTOR | Freq: Every day | SUBCUTANEOUS | 11 refills | Status: DC

## 2018-04-02 MED ORDER — SEMAGLUTIDE(0.25 OR 0.5MG/DOS) 2 MG/1.5ML ~~LOC~~ SOPN: 0.2500 mg | PEN_INJECTOR | Freq: Every day | SUBCUTANEOUS | 2 refills | Status: DC

## 2018-04-02 NOTE — Telephone Encounter (Signed)
Sent lantus, start at 30 U daily, increase by one unit daily until FBS is 100

## 2018-04-02 NOTE — Telephone Encounter (Signed)
INsurance denied [facility] is Lantus

## 2018-04-02 NOTE — Telephone Encounter (Signed)
Patient aware.

## 2018-04-03 ENCOUNTER — Telehealth: Payer: Self-pay | Admitting: Physician Assistant

## 2018-04-03 NOTE — Telephone Encounter (Signed)
Patient needed to know name to let PA at work know.

## 2018-04-04 NOTE — Progress Notes (Signed)
BP 113/65   Pulse 87   Ht 5\' 6"  (1.676 m)   Wt 197 lb (89.4 kg)   BMI 31.80 kg/m    Subjective:    Patient ID: Carrie [name], female    DOB: 17-Aug-1960, 57 y.o.   MRN: [id number]  HPI: Carrie [name] is a 57 y.o. female presenting on 04/02/2018 for Diabetes  This patient comes in for periodic recheck on medications and conditions including diabetes. She has had insurance changes andthey are not paying for toujeo anymore.   All medications are reviewed today. There are no reports of any problems with the medications. All of the medical conditions are reviewed and updated.  Lab work is reviewed and will be ordered as medically necessary. There are no new problems reported with today's visit.   Past Medical History:  Diagnosis Date  . Depression   . Diabetes mellitus without complication ([place])   . Fever blister   . Gastroparesis   . Hyperlipidemia   . Hypertension   . Migraine    Relevant past medical, surgical, family and social history reviewed and updated as indicated. Interim medical history since our last visit reviewed. Allergies and medications reviewed and updated. DATA REVIEWED: CHART IN EPIC  Family History reviewed for pertinent findings.  Review of Systems  Constitutional: Negative.   HENT: Negative.   Eyes: Negative.   Respiratory: Negative.   Gastrointestinal: Negative.   Genitourinary: Negative.     Allergies as of 04/02/2018   No Known Allergies     Medication List        Accurate as of 04/02/18 11:59 PM. Always use your most recent med list.          fluticasone 50 MCG/ACT nasal spray Commonly known as:  FLONASE Place 2 sprays into both nostrils daily.   Insulin Glargine 100 UNIT/ML Solostar Pen Commonly known as:  LANTUS Inject 30-60 Units into the skin daily. Start at 30 U, increase by one unit daily until FBS is 100   Insulin Pen Needle 33G X 4 MM Misc 1 each by Does not apply route daily. Use to inject medication once daily     loratadine 10 MG tablet Commonly known as:  CLARITIN Take 1 tablet (10 mg total) by mouth daily.   Naltrexone-buPROPion HCl ER 8-90 MG Tb12 Take 1 tablet by mouth 3 (three) times daily.   olmesartan 20 MG tablet Commonly known as:  BENICAR Take 1 tablet (20 mg total) by mouth daily.   omeprazole 40 MG capsule Commonly known as:  PRILOSEC Take 1 capsule (40 mg total) by mouth daily.   PARoxetine 30 MG tablet Commonly known as:  PAXIL Take 1 tablet (30 mg total) by mouth daily. (Needs to be seen)   Semaglutide(0.25 or 0.5MG /DOS) 2 MG/1.5ML Sopn Inject 0.25-0.5 mg into the skin daily.   SUMAtriptan 100 MG tablet Commonly known as:  IMITREX TAKE 1 TABLET EVERY 2 HOURS AS NEEDED FOR MIGRAINES   telmisartan-hydrochlorothiazide 40-12.5 MG tablet Commonly known as:  MICARDIS HCT Take 1 tablet by mouth daily.   valACYclovir 1000 MG tablet Commonly known as:  VALTREX AS NEEDED          Objective:    BP 113/65   Pulse 87   Ht 5\' 6"  (1.676 m)   Wt 197 lb (89.4 kg)   BMI 31.80 kg/m   No Known Allergies  Wt Readings from Last 3 Encounters:  04/02/18 197 lb (89.4 kg)  11/13/17 197 lb (89.4  kg)  05/30/17 193 lb 6.4 oz (87.7 kg)    Physical Exam  Constitutional: She is oriented to person, place, and time. She appears well-developed and well-nourished.  HENT:  Head: Normocephalic and atraumatic.  Eyes: Pupils are equal, round, and reactive to light. Conjunctivae and EOM are normal.  Cardiovascular: Normal rate, regular rhythm, normal heart sounds and intact distal pulses.  Pulmonary/Chest: Effort normal and breath sounds normal.  Abdominal: Soft. Bowel sounds are normal.  Neurological: She is alert and oriented to person, place, and time. She has normal reflexes.  Skin: Skin is warm and dry. No rash noted.  Psychiatric: She has a normal mood and affect. Her behavior is normal. Judgment and thought content normal.    Results for orders placed or performed in visit on  04/02/18  Bayer DCA Hb A1c Waived  Result Value Ref Range   HB A1C (BAYER DCA - WAIVED) 8.1 (H) <7.0 %      Assessment & Plan:   1. Type 1 diabetes mellitus without complications (HCC) - [id number],7.61 or 0.5MG /DOS, (OZEMPIC, 0.25 OR 0.5 MG/DOSE,) 2 MG/1.5ML SOPN; Inject 0.25-0.5 mg into the skin daily.  Dispense: 4 pen; Refill: 2 - Bayer DCA Hb A1c Waived  2. Need for immunization against influenza - Flu Vaccine QUAD 36+ mos IM    Continue all other maintenance medications as listed above.  Follow up plan: Return in about 3 months (around 07/03/2018) for recheck.  Educational handout given for [place] PA-C [facility] [address]  [place], [place] [address] [phone]   04/04/2018, 9:51 PM

## 2018-04-05 ENCOUNTER — Telehealth: Payer: Self-pay

## 2018-04-05 NOTE — Telephone Encounter (Signed)
Needs clarification on patient's prescription for Ozempic.  When does she increase her dosage to .5, how long does 4 pens cover etc.

## 2018-04-05 NOTE — Telephone Encounter (Signed)
[facility] informed to dispense 0.25 Ozempic weekly x 4 weeks, then increase to 0.5 afterwards.

## 2018-04-05 NOTE — Telephone Encounter (Signed)
Left message on [facility] vm that she is to inject .25mg  daily for 2 weeks and then .5mg  daily after that.

## 2018-04-05 NOTE — Telephone Encounter (Signed)
Did they dispense .25mg  or.5mg  dosed pens? It is okay to stay on the .25 for a month, or after 2 weeks can try the .5 mg. But if they only gave her the 0.25 she will have to do that 4 months.  And then we can increase to the point stopped.

## 2018-04-06 ENCOUNTER — Other Ambulatory Visit: Payer: Self-pay | Admitting: Physician Assistant

## 2018-04-06 DIAGNOSIS — F324 Major depressive disorder, single episode, in partial remission: Secondary | ICD-10-CM

## 2018-04-10 ENCOUNTER — Other Ambulatory Visit: Payer: Self-pay | Admitting: Physician Assistant

## 2018-04-10 DIAGNOSIS — E109 Type 1 diabetes mellitus without complications: Secondary | ICD-10-CM

## 2018-04-10 MED ORDER — SEMAGLUTIDE(0.25 OR 0.5MG/DOS) 2 MG/1.5ML ~~LOC~~ SOPN: 0.2500 mg | PEN_INJECTOR | SUBCUTANEOUS | 2 refills | Status: DC

## 2018-04-29 ENCOUNTER — Encounter: Payer: Self-pay | Admitting: Family

## 2018-04-29 ENCOUNTER — Ambulatory Visit: Payer: BLUE CROSS/BLUE SHIELD | Admitting: Family

## 2018-04-29 VITALS — BP 115/71 | HR 80 | Temp 97.8°F | Ht 66.0 in | Wt 198.2 lb

## 2018-04-29 DIAGNOSIS — B029 Zoster without complications: Secondary | ICD-10-CM

## 2018-04-29 MED ORDER — VALACYCLOVIR HCL 1 G PO TABS: 1000.0000 mg | ORAL_TABLET | Freq: Three times a day (TID) | ORAL | 0 refills | Status: DC

## 2018-04-29 NOTE — Progress Notes (Signed)
   Subjective:    Patient ID: Carrie [name], female    DOB: 1960-09-14, 57 y.o.   MRN: [id number]  Chief Complaint  Patient presents with  . right cheek and ear stinging, burn    HPI PT presents to the office today with complaints of soreness, burning and tingling on right jaw that extended behind her ear up to scalp, only on right side. She states she noticed this yesterday, but the pain has worsen to burning pain of 6 out 10.   She has had shingles in the past and the pain is very similar. She states she just does not "feel good".    Review of Systems  All other systems reviewed and are negative.      Objective:   Physical Exam  Constitutional: She is oriented to person, place, and time. She appears well-developed and well-nourished. No distress.  HENT:  Head: Normocephalic.  Right Ear: External ear normal.  Left Ear: External ear normal.  Mouth/Throat: Oropharynx is clear and moist.  Eyes: Pupils are equal, round, and reactive to light.  Neck: Normal range of motion. Neck supple. No thyromegaly present.  Cardiovascular: Normal rate, regular rhythm, normal heart sounds and intact distal pulses.  No murmur heard. Pulmonary/Chest: Effort normal and breath sounds normal. No respiratory distress. She has no wheezes.  Abdominal: Soft. Bowel sounds are normal. She exhibits no distension. There is no tenderness.  Musculoskeletal: Normal range of motion. She exhibits no edema or tenderness.  Neurological: She is alert and oriented to person, place, and time. She has normal reflexes. No cranial nerve deficit.  Skin: Skin is warm and dry.  No rash noted on right jaw, behind ear, or scalp. Tenderness with palpation   Psychiatric: She has a normal mood and affect. Her behavior is normal. Judgment and thought content normal.  Vitals reviewed.   BP 115/71   Pulse 80   Temp 97.8 F (36.6 C) (Oral)   Ht 5\' 6"  (1.676 m)   Wt 198 lb 3.2 oz (89.9 kg)   BMI 31.99 kg/m         Assessment & Plan:  Carrie [name] comes in today with chief complaint of right cheek and ear stinging, burn   Diagnosis and orders addressed:  1. Herpes zoster without complication Do not scratch Cool compress  Start Valtrex today Avoid pregnant women, babies, or immune suppressed RTO if symptoms worsen or do not improve - valACYclovir (VALTREX) 1000 MG tablet; Take 1 tablet (1,000 mg total) by mouth 3 (three) times daily.  Dispense: 21 tablet; Refill: 0   Evelina Dun, FNP

## 2018-04-29 NOTE — Patient Instructions (Signed)
Shingles Shingles, which is also known as herpes zoster, is an infection that causes a painful skin rash and fluid-filled blisters. Shingles is not related to genital herpes, which is a sexually transmitted infection. Shingles only develops in people who:  Have had chickenpox.  Have received the chickenpox vaccine. (This is rare.)  What are the causes? Shingles is caused by varicella-zoster virus (VZV). This is the same virus that causes chickenpox. After exposure to VZV, the virus stays in the body in an inactive (dormant) state. Shingles develops if the virus reactivates. This can happen many years after the initial exposure to VZV. It is not known what causes this virus to reactivate. What increases the risk? People who have had chickenpox or received the chickenpox vaccine are at risk for shingles. Infection is more common in people who:  Are older than age 50.  Have a weakened defense (immune) system, such as those with HIV, AIDS, or cancer.  Are taking medicines that weaken the immune system, such as transplant medicines.  Are under great stress.  What are the signs or symptoms? Early symptoms of this condition include itching, tingling, and pain in an area on your skin. Pain may be described as burning, stabbing, or throbbing. A few days or weeks after symptoms start, a painful red rash appears, usually on one side of the body in a bandlike or beltlike pattern. The rash eventually turns into fluid-filled blisters that break open, scab over, and dry up in about 2-3 weeks. At any time during the infection, you may also develop:  A fever.  Chills.  A headache.  An upset stomach.  How is this diagnosed? This condition is diagnosed with a skin exam. Sometimes, skin or fluid samples are taken from the blisters before a diagnosis is made. These samples are examined under a microscope or sent to a lab for testing. How is this treated? There is no specific cure for this condition.  Your health care provider will probably prescribe medicines to help you manage pain, recover more quickly, and avoid long-term problems. Medicines may include:  Antiviral drugs.  Anti-inflammatory drugs.  Pain medicines.  If the area involved is on your face, you may be referred to a specialist, such as an eye doctor (ophthalmologist) or an ear, nose, and throat (ENT) doctor to help you avoid eye problems, chronic pain, or disability. Follow these instructions at home: Medicines  Take medicines only as directed by your health care provider.  Apply an anti-itch or numbing cream to the affected area as directed by your health care provider. Blister and Rash Care  Take a cool bath or apply cool compresses to the area of the rash or blisters as directed by your health care provider. This may help with pain and itching.  Keep your rash covered with a loose bandage (dressing). Wear loose-fitting clothing to help ease the pain of material rubbing against the rash.  Keep your rash and blisters clean with mild soap and cool water or as directed by your health care provider.  Check your rash every day for signs of infection. These include redness, swelling, and pain that lasts or increases.  Do not pick your blisters.  Do not scratch your rash. General instructions  Rest as directed by your health care provider.  Keep all follow-up visits as directed by your health care provider. This is important.  Until your blisters scab over, your infection can cause chickenpox in people who have never had it or been vaccinated   against it. To prevent this from happening, avoid contact with other people, especially: ? Babies. ? Pregnant women. ? Children who have eczema. ? Elderly people who have transplants. ? People who have chronic illnesses, such as leukemia or AIDS. Contact a health care provider if:  Your pain is not relieved with prescribed medicines.  Your pain does not get better after  the rash heals.  Your rash looks infected. Signs of infection include redness, swelling, and pain that lasts or increases. Get help right away if:  The rash is on your face or nose.  You have facial pain, pain around your eye area, or loss of feeling on one side of your face.  You have ear pain or you have ringing in your ear.  You have loss of taste.  Your condition gets worse. This information is not intended to replace advice given to you by your health care provider. Make sure you discuss any questions you have with your health care provider. Document Released: 05/22/2005 Document Revised: 01/16/2016 Document Reviewed: 04/02/2014 Elsevier Interactive Patient Education  2018 [employer] Inc[employer]  

## 2018-05-31 ENCOUNTER — Other Ambulatory Visit: Payer: Self-pay | Admitting: Physician Assistant

## 2018-06-13 DIAGNOSIS — Z1211 Encounter for screening for malignant neoplasm of colon: Secondary | ICD-10-CM | POA: Diagnosis not present

## 2018-07-03 ENCOUNTER — Encounter: Payer: Self-pay | Admitting: Physician Assistant

## 2018-07-03 ENCOUNTER — Ambulatory Visit: Payer: BLUE CROSS/BLUE SHIELD | Admitting: Physician Assistant

## 2018-07-03 VITALS — BP 107/63 | HR 87 | Temp 97.0°F | Ht 66.0 in | Wt 198.0 lb

## 2018-07-03 DIAGNOSIS — R635 Abnormal weight gain: Secondary | ICD-10-CM | POA: Diagnosis not present

## 2018-07-03 DIAGNOSIS — E109 Type 1 diabetes mellitus without complications: Secondary | ICD-10-CM

## 2018-07-03 LAB — BAYER DCA HB A1C WAIVED: HB A1C (BAYER DCA - WAIVED): 8.5 % — ABNORMAL HIGH (ref <7.0)

## 2018-07-03 MED ORDER — INSULIN GLARGINE 100 UNIT/ML SOLOSTAR PEN: 60.0000 [IU] | PEN_INJECTOR | Freq: Every day | SUBCUTANEOUS | 11 refills | Status: DC

## 2018-07-03 MED ORDER — NALTREXONE-BUPROPION HCL ER 8-90 MG PO TB12: 1.0000 | ORAL_TABLET | Freq: Three times a day (TID) | ORAL | 5 refills | Status: DC

## 2018-07-03 MED ORDER — ERYTHROMYCIN 5 MG/GM OP OINT: 1.0000 "application " | TOPICAL_OINTMENT | Freq: Three times a day (TID) | OPHTHALMIC | 0 refills | Status: DC

## 2018-07-03 MED ORDER — SEMAGLUTIDE (1 MG/DOSE) 2 MG/1.5ML ~~LOC~~ SOPN: 1.0000 mg | PEN_INJECTOR | SUBCUTANEOUS | 6 refills | Status: DC

## 2018-07-03 NOTE — Progress Notes (Signed)
BP 107/63   Pulse 87   Temp (!) 97 F (36.1 C) (Oral)   Ht 5\' 6"  (1.676 m)   Wt 198 lb (89.8 kg)   BMI 31.96 kg/m    Subjective:    Patient ID: Carrie [name], female    DOB: February 23, 1961, 58 y.o.   MRN: [id number]  HPI: Carrie [name] is a 58 y.o. female presenting on 07/03/2018 for Diabetes (3 month ) and Hypertension  This patient comes in for periodic recheck on medications and conditions including diabetes, weight gain, obesity.  She does not think the changes from Xultophy to lantus and ozempic. She needs refills on mediations and labs, she has not had a big change in her weight.  All medications are reviewed today. There are no reports of any problems with the medications. All of the medical conditions are reviewed and updated.  Lab work is reviewed and will be ordered as medically necessary. There are no new problems reported with today's visit.   Past Medical History:  Diagnosis Date  . Depression   . Diabetes mellitus without complication ([place])   . Fever blister   . Gastroparesis   . Hyperlipidemia   . Hypertension   . Migraine    Relevant past medical, surgical, family and social history reviewed and updated as indicated. Interim medical history since our last visit reviewed. Allergies and medications reviewed and updated. DATA REVIEWED: CHART IN EPIC  Family History reviewed for pertinent findings.  Review of Systems  Constitutional: Negative.  Negative for activity change, fatigue and fever.  HENT: Negative.   Eyes: Negative.   Respiratory: Negative.  Negative for cough.   Cardiovascular: Negative.  Negative for chest pain.  Gastrointestinal: Negative.  Negative for abdominal pain.  Endocrine: Negative.   Genitourinary: Negative.  Negative for dysuria.  Musculoskeletal: Negative.   Skin: Negative.   Neurological: Negative.     Allergies as of 07/03/2018   No Known Allergies     Medication List       Accurate as of July 03, 2018  7:34 PM. Always use  your most recent med list.        erythromycin ophthalmic ointment Place 1 application into the right eye 3 (three) times daily.   fluticasone 50 MCG/ACT nasal spray Commonly known as:  FLONASE Place 2 sprays into both nostrils daily.   Insulin Glargine 100 UNIT/ML Solostar Pen Commonly known as:  LANTUS SOLOSTAR Inject 60-90 Units into the skin daily. Start at 30 U, increase by one unit daily until FBS is 100   Insulin Pen Needle 33G X 4 MM Misc Commonly known as:  ADVOCATE INSULIN PEN NEEDLES 1 each by Does not apply route daily. Use to inject medication once daily   RELION PEN NEEDLES 32G X 4 MM Misc Generic drug:  Insulin Pen Needle USE TO INJECT MEDICATION ONCE DAILY   loratadine 10 MG tablet Commonly known as:  CLARITIN Take 1 tablet (10 mg total) by mouth daily.   Naltrexone-buPROPion HCl ER 8-90 MG Tb12 Commonly known as:  CONTRAVE Take 1 tablet by mouth 3 (three) times daily.   olmesartan 20 MG tablet Commonly known as:  BENICAR Take 1 tablet (20 mg total) by mouth daily.   omeprazole 40 MG capsule Commonly known as:  PRILOSEC Take 1 capsule (40 mg total) by mouth daily.   PARoxetine 30 MG tablet Commonly known as:  PAXIL TAKE 1 TABLET BY MOUTH ONCE DAILY (NEEDS  TO  BE  SEEN)  Semaglutide (1 MG/DOSE) 2 MG/1.5ML Sopn Commonly known as:  OZEMPIC (1 MG/DOSE) Inject 1 mg into the skin once a week.   SUMAtriptan 100 MG tablet Commonly known as:  IMITREX TAKE 1 TABLET EVERY 2 HOURS AS NEEDED FOR MIGRAINES   telmisartan-hydrochlorothiazide 40-12.5 MG tablet Commonly known as:  MICARDIS HCT Take 1 tablet by mouth daily.   valACYclovir 1000 MG tablet Commonly known as:  VALTREX Take 1 tablet (1,000 mg total) by mouth 3 (three) times daily.          Objective:    BP 107/63   Pulse 87   Temp (!) 97 F (36.1 C) (Oral)   Ht 5\' 6"  (1.676 m)   Wt 198 lb (89.8 kg)   BMI 31.96 kg/m   No Known Allergies  Wt Readings from Last 3 Encounters:    07/03/18 198 lb (89.8 kg)  04/29/18 198 lb 3.2 oz (89.9 kg)  04/02/18 197 lb (89.4 kg)    Physical Exam Constitutional:      Appearance: She is well-developed.  HENT:     Head: Normocephalic and atraumatic.     Right Ear: Tympanic membrane, ear canal and external ear normal.     Left Ear: Tympanic membrane, ear canal and external ear normal.     Nose: Nose normal. No rhinorrhea.     Mouth/Throat:     Pharynx: No oropharyngeal exudate or posterior oropharyngeal erythema.  Eyes:     Conjunctiva/sclera: Conjunctivae normal.     Pupils: Pupils are equal, round, and reactive to light.  Neck:     Musculoskeletal: Normal range of motion and neck supple.  Cardiovascular:     Rate and Rhythm: Normal rate and regular rhythm.     Heart sounds: Normal heart sounds.  Pulmonary:     Effort: Pulmonary effort is normal.     Breath sounds: Normal breath sounds.  Abdominal:     General: Bowel sounds are normal.     Palpations: Abdomen is soft.  Skin:    General: Skin is warm and dry.     Findings: No rash.  Neurological:     Mental Status: She is alert and oriented to person, place, and time.     Deep Tendon Reflexes: Reflexes are normal and symmetric.  Psychiatric:        Behavior: Behavior normal.        Thought Content: Thought content normal.        Judgment: Judgment normal.     Results for orders placed or performed in visit on 07/03/18  Bayer DCA Hb A1c Waived  Result Value Ref Range   HB A1C (BAYER DCA - WAIVED) 8.5 (H) <7.0 %      Assessment & Plan:   1. Type 1 diabetes mellitus without complications (HCC) - Semaglutide, 1 MG/DOSE, (OZEMPIC, 1 MG/DOSE,) 2 MG/1.5ML SOPN; Inject 1 mg into the skin once a week.  Dispense: 2 pen; Refill: 6 - Insulin Glargine (LANTUS SOLOSTAR) 100 UNIT/ML Solostar Pen; Inject 60-90 Units into the skin daily. Start at 30 U, increase by one unit daily until FBS is 100  Dispense: 30 mL; Refill: 11 - Lipid panel - Bayer DCA Hb A1c Waived  2.  Weight gain - Naltrexone-buPROPion HCl ER (CONTRAVE) 8-90 MG TB12; Take 1 tablet by mouth 3 (three) times daily.  Dispense: 90 tablet; Refill: 5  3. Morbid obesity (HCC) - Naltrexone-buPROPion HCl ER (CONTRAVE) 8-90 MG TB12; Take 1 tablet by mouth 3 (three) times daily.  Dispense:  90 tablet; Refill: 5   Continue all other maintenance medications as listed above.  Follow up plan: Return in about 3 months (around 10/02/2018).  Educational handout given for [place] PA-C [facility] [address]  [place], [place] [address] [phone]   07/03/2018, 7:34 PM

## 2018-07-04 LAB — LIPID PANEL
Chol/HDL Ratio: 3.4 ratio (ref 0.0–4.4)
Cholesterol, Total: 185 mg/dL (ref 100–199)
HDL: 54 mg/dL (ref >39)
LDL Calculated: 107 mg/dL — ABNORMAL HIGH (ref 0–99)
Triglycerides: 119 mg/dL (ref 0–149)
VLDL Cholesterol Cal: 24 mg/dL (ref 5–40)

## 2018-07-19 ENCOUNTER — Other Ambulatory Visit: Payer: Self-pay | Admitting: Physician Assistant

## 2018-08-03 DIAGNOSIS — Z6830 Body mass index (BMI) 30.0-30.9, adult: Secondary | ICD-10-CM | POA: Diagnosis not present

## 2018-08-03 DIAGNOSIS — J029 Acute pharyngitis, unspecified: Secondary | ICD-10-CM | POA: Diagnosis not present

## 2018-08-03 DIAGNOSIS — R6889 Other general symptoms and signs: Secondary | ICD-10-CM | POA: Diagnosis not present

## 2018-08-07 ENCOUNTER — Other Ambulatory Visit: Payer: Self-pay | Admitting: Physician Assistant

## 2018-08-07 MED ORDER — ONDANSETRON 8 MG PO TBDP: 8.0000 mg | ORAL_TABLET | Freq: Three times a day (TID) | ORAL | 0 refills | Status: DC | PRN

## 2018-08-07 NOTE — Telephone Encounter (Signed)
I am sending Zofran dissolvable tablets to her pharmacy.

## 2018-08-07 NOTE — Telephone Encounter (Signed)
Patient aware.

## 2018-08-07 NOTE — Telephone Encounter (Signed)
Please advise on medication request

## 2018-08-20 ENCOUNTER — Other Ambulatory Visit: Payer: Self-pay | Admitting: *Deleted

## 2018-08-20 MED ORDER — INSULIN PEN NEEDLE 32G X 4 MM MISC: 3 refills | Status: DC

## 2018-09-26 ENCOUNTER — Ambulatory Visit
Admission: RE | Admit: 2018-09-26 | Discharge: 2018-09-26 | Disposition: A | Discharge status: Home / Self Care | Payer: BLUE CROSS/BLUE SHIELD | Source: Ambulatory Visit | Attending: Obstetrics and Gynecology | Admitting: Obstetrics and Gynecology

## 2018-09-26 ENCOUNTER — Other Ambulatory Visit: Payer: Self-pay

## 2018-09-26 ENCOUNTER — Other Ambulatory Visit: Payer: Self-pay | Admitting: Obstetrics and Gynecology

## 2018-09-26 DIAGNOSIS — N6489 Other specified disorders of breast: Secondary | ICD-10-CM

## 2018-10-07 ENCOUNTER — Ambulatory Visit: Payer: BLUE CROSS/BLUE SHIELD | Admitting: Physician Assistant

## 2018-10-07 ENCOUNTER — Other Ambulatory Visit: Payer: Self-pay

## 2018-10-07 ENCOUNTER — Encounter: Payer: Self-pay | Admitting: Physician Assistant

## 2018-10-07 VITALS — BP 105/70 | HR 78 | Temp 97.9°F | Ht 66.0 in | Wt 193.2 lb

## 2018-10-07 DIAGNOSIS — E1159 Type 2 diabetes mellitus with other circulatory complications: Secondary | ICD-10-CM | POA: Diagnosis not present

## 2018-10-07 DIAGNOSIS — I152 Hypertension secondary to endocrine disorders: Secondary | ICD-10-CM

## 2018-10-07 DIAGNOSIS — I10 Essential (primary) hypertension: Secondary | ICD-10-CM | POA: Diagnosis not present

## 2018-10-07 DIAGNOSIS — E109 Type 1 diabetes mellitus without complications: Secondary | ICD-10-CM

## 2018-10-07 LAB — BAYER DCA HB A1C WAIVED: HB A1C (BAYER DCA - WAIVED): 7.3 % — ABNORMAL HIGH (ref <7.0)

## 2018-10-07 NOTE — Progress Notes (Signed)
BP 105/70   Pulse 78   Temp 97.9 F (36.6 C) (Oral)   Ht _0  (1.676 m)   Wt 193 lb 3.2 oz (87.6 kg)   BMI 31.18 kg/m    Subjective:    Patient ID: Carrie [name], female    DOB: 1960-06-09, 58 y.o.   MRN: [id number]  HPI: Carrie [name] is a 58 y.o. female presenting on 10/07/2018 for Diabetes (3 month)  This patient comes in for periodic recheck on her chronic medical conditions.  They do include type 1 diabetes, hypertension with diabetes, hyperlipidemia, migraines. She reports overall she is doing fairly well.  Her migraines have been fairly well controlled even with the stress of COVID-19.  She has been working half days.  She expects to go back to work full-time in the coming week.  She states she has had fairly good sugar readings in the mornings they are usually around 150.  She will sometimes have pre-meal readings later in the day that can be 200.  But usually it will stay under that.  Her last A1c was 8.5.  She has lost about 5 pounds since the beginning of the year.  She is very happy with that result.  She does need refills sent in on her medications.  Past Medical History:  Diagnosis Date  . Depression   . Diabetes mellitus without complication ([place])   . Fever blister   . Gastroparesis   . Hyperlipidemia   . Hypertension   . Migraine    Relevant past medical, surgical, family and social history reviewed and updated as indicated. Interim medical history since our last visit reviewed. Allergies and medications reviewed and updated. DATA REVIEWED: CHART IN EPIC  Family History reviewed for pertinent findings.  Review of Systems  Constitutional: Negative.   HENT: Negative.   Eyes: Negative.   Respiratory: Negative.   Gastrointestinal: Negative.   Genitourinary: Negative.     Allergies as of 10/07/2018   No Known Allergies     Medication List       Accurate as of Oct 07, 2018  2:39 PM. Always use your most recent med list.        fluticasone 50 MCG/ACT nasal  spray Commonly known as:  FLONASE Place 2 sprays into both nostrils daily.   Insulin Glargine 100 UNIT/ML Solostar Pen Commonly known as:  Lantus SoloStar Inject 60-90 Units into the skin daily. Start at 30 U, increase by one unit daily until FBS is 100   Insulin Pen Needle 32G X 4 MM Misc Commonly known as:  ReliOn Pen Needles USE TO INJECT MEDICATION ONCE DAILY Dx 10.9   loratadine 10 MG tablet Commonly known as:  CLARITIN Take 1 tablet (10 mg total) by mouth daily.   Naltrexone-buPROPion HCl ER 8-90 MG Tb12 Commonly known as:  Contrave Take 1 tablet by mouth 3 (three) times daily.   olmesartan 20 MG tablet Commonly known as:  BENICAR Take 1 tablet (20 mg total) by mouth daily.   omeprazole 40 MG capsule Commonly known as:  PRILOSEC Take 1 capsule (40 mg total) by mouth daily.   ondansetron 8 MG disintegrating tablet Commonly known as:  Zofran ODT Take 1 tablet (8 mg total) by mouth every 8 (eight) hours as needed for nausea or vomiting.   PARoxetine 30 MG tablet Commonly known as:  PAXIL TAKE 1 TABLET BY MOUTH ONCE DAILY (NEEDS  TO  BE  SEEN)   Semaglutide (1 MG/DOSE) 2 MG/1.5ML  Sopn Commonly known as:  Ozempic (1 MG/DOSE) Inject 1 mg into the skin once a week.   SUMAtriptan 100 MG tablet Commonly known as:  IMITREX TAKE 1 TABLET EVERY 2 HOURS AS NEEDED FOR MIGRAINES   telmisartan-hydrochlorothiazide 40-12.5 MG tablet Commonly known as:  MICARDIS HCT TAKE 1 TABLET BY MOUTH ONCE DAILY   valACYclovir 1000 MG tablet Commonly known as:  VALTREX Take 1 tablet (1,000 mg total) by mouth 3 (three) times daily.          Objective:    BP 105/70   Pulse 78   Temp 97.9 F (36.6 C) (Oral)   Ht _0  (1.676 m)   Wt 193 lb 3.2 oz (87.6 kg)   BMI 31.18 kg/m   No Known Allergies  Wt Readings from Last 3 Encounters:  10/07/18 193 lb 3.2 oz (87.6 kg)  07/03/18 198 lb (89.8 kg)  04/29/18 198 lb 3.2 oz (89.9 kg)    Physical Exam Constitutional:       Appearance: She is well-developed.  HENT:     Head: Normocephalic and atraumatic.  Eyes:     Conjunctiva/sclera: Conjunctivae normal.     Pupils: Pupils are equal, round, and reactive to light.  Cardiovascular:     Rate and Rhythm: Normal rate and regular rhythm.     Heart sounds: Normal heart sounds.  Pulmonary:     Effort: Pulmonary effort is normal.     Breath sounds: Normal breath sounds.  Abdominal:     General: Bowel sounds are normal.     Palpations: Abdomen is soft.  Skin:    General: Skin is warm and dry.     Findings: No rash.  Neurological:     Mental Status: She is alert and oriented to person, place, and time.     Deep Tendon Reflexes: Reflexes are normal and symmetric.  Psychiatric:        Behavior: Behavior normal.        Thought Content: Thought content normal.        Judgment: Judgment normal.     Results for orders placed or performed in visit on 10/07/18  Bayer DCA Hb A1c Waived  Result Value Ref Range   HB A1C (BAYER DCA - WAIVED) 7.3 (H) <7.0 %      Assessment & Plan:   1. Type 1 diabetes mellitus without complications (HCC) - IRJ18+ACZY - Lipid panel - TSH - Bayer DCA Hb A1c Waived  2. Hypertension associated with diabetes ([place]) - CBC with Differential/Platelet - CMP14+EGFR  3. Morbid obesity ([place]) - CBC with Differential/Platelet - CMP14+EGFR - Lipid panel - TSH - Bayer DCA Hb A1c Waived   Continue all other maintenance medications as listed above.  Follow up plan: Return in about 3 months (around 01/07/2019) for recheck.  Educational handout given for [place] PA-C [facility] [address]  [place], [place] [address] [phone]   10/07/2018, 2:39 PM

## 2018-10-08 LAB — CBC WITH DIFFERENTIAL/PLATELET
Basophils Absolute: 0.1 10*3/uL (ref 0.0–0.2)
Basos: 1 %
EOS (ABSOLUTE): 0.3 10*3/uL (ref 0.0–0.4)
Eos: 3 %
Hematocrit: 37.9 % (ref 34.0–46.6)
Hemoglobin: 13.5 g/dL (ref 11.1–15.9)
Immature Grans (Abs): 0 10*3/uL (ref 0.0–0.1)
Immature Granulocytes: 0 %
Lymphocytes Absolute: 2.7 10*3/uL (ref 0.7–3.1)
Lymphs: 28 %
MCH: 32.1 pg (ref 26.6–33.0)
MCHC: 35.6 g/dL (ref 31.5–35.7)
MCV: 90 fL (ref 79–97)
Monocytes Absolute: 0.9 10*3/uL (ref 0.1–0.9)
Monocytes: 9 %
Neutrophils Absolute: 5.7 10*3/uL (ref 1.4–7.0)
Neutrophils: 59 %
Platelets: 513 10*3/uL — ABNORMAL HIGH (ref 150–450)
RBC: 4.2 x10E6/uL (ref 3.77–5.28)
RDW: 12.4 % (ref 11.7–15.4)
WBC: 9.6 10*3/uL (ref 3.4–10.8)

## 2018-10-08 LAB — LIPID PANEL
Chol/HDL Ratio: 3.4 ratio (ref 0.0–4.4)
Cholesterol, Total: 178 mg/dL (ref 100–199)
HDL: 52 mg/dL (ref >39)
LDL Calculated: 95 mg/dL (ref 0–99)
Triglycerides: 153 mg/dL — ABNORMAL HIGH (ref 0–149)
VLDL Cholesterol Cal: 31 mg/dL (ref 5–40)

## 2018-10-08 LAB — CMP14+EGFR
ALT: 17 IU/L (ref 0–32)
AST: 15 IU/L (ref 0–40)
Albumin/Globulin Ratio: 2 (ref 1.2–2.2)
Albumin: 4.7 g/dL (ref 3.8–4.9)
Alkaline Phosphatase: 69 IU/L (ref 39–117)
BUN/Creatinine Ratio: 23 (ref 9–23)
BUN: 20 mg/dL (ref 6–24)
Bilirubin Total: 0.5 mg/dL (ref 0.0–1.2)
CO2: 20 mmol/L (ref 20–29)
Calcium: 9.8 mg/dL (ref 8.7–10.2)
Chloride: 99 mmol/L (ref 96–106)
Creatinine, Ser: 0.86 mg/dL (ref 0.57–1.00)
GFR calc Af Amer: 86 mL/min/{1.73_m2} (ref >59)
GFR calc non Af Amer: 75 mL/min/{1.73_m2} (ref >59)
Globulin, Total: 2.3 g/dL (ref 1.5–4.5)
Glucose: 144 mg/dL — ABNORMAL HIGH (ref 65–99)
Potassium: 4.3 mmol/L (ref 3.5–5.2)
Sodium: 137 mmol/L (ref 134–144)
Total Protein: 7 g/dL (ref 6.0–8.5)

## 2018-10-08 LAB — TSH: TSH: 3.5 u[IU]/mL (ref 0.450–4.500)

## 2018-11-01 ENCOUNTER — Other Ambulatory Visit: Payer: Self-pay | Admitting: Physician Assistant

## 2018-11-05 DIAGNOSIS — E669 Obesity, unspecified: Secondary | ICD-10-CM | POA: Diagnosis not present

## 2018-11-05 DIAGNOSIS — Z794 Long term (current) use of insulin: Secondary | ICD-10-CM | POA: Diagnosis not present

## 2018-11-05 DIAGNOSIS — E1165 Type 2 diabetes mellitus with hyperglycemia: Secondary | ICD-10-CM | POA: Diagnosis not present

## 2019-01-10 ENCOUNTER — Ambulatory Visit: Payer: BLUE CROSS/BLUE SHIELD | Admitting: Physician Assistant

## 2019-01-13 ENCOUNTER — Ambulatory Visit: Payer: BLUE CROSS/BLUE SHIELD | Admitting: Family Medicine

## 2019-01-23 ENCOUNTER — Ambulatory Visit: Payer: BLUE CROSS/BLUE SHIELD | Admitting: Family Medicine

## 2019-01-23 ENCOUNTER — Encounter: Payer: Self-pay | Admitting: Family Medicine

## 2019-01-23 ENCOUNTER — Other Ambulatory Visit: Payer: Self-pay

## 2019-01-23 VITALS — BP 119/75 | HR 84 | Temp 98.6°F | Ht 66.0 in | Wt 191.0 lb

## 2019-01-23 DIAGNOSIS — I152 Hypertension secondary to endocrine disorders: Secondary | ICD-10-CM

## 2019-01-23 DIAGNOSIS — E119 Type 2 diabetes mellitus without complications: Secondary | ICD-10-CM

## 2019-01-23 DIAGNOSIS — D473 Essential (hemorrhagic) thrombocythemia: Secondary | ICD-10-CM

## 2019-01-23 DIAGNOSIS — E1159 Type 2 diabetes mellitus with other circulatory complications: Secondary | ICD-10-CM

## 2019-01-23 DIAGNOSIS — Z23 Encounter for immunization: Secondary | ICD-10-CM

## 2019-01-23 DIAGNOSIS — E785 Hyperlipidemia, unspecified: Secondary | ICD-10-CM

## 2019-01-23 DIAGNOSIS — E1169 Type 2 diabetes mellitus with other specified complication: Secondary | ICD-10-CM | POA: Diagnosis not present

## 2019-01-23 DIAGNOSIS — D75839 Thrombocytosis, unspecified: Secondary | ICD-10-CM

## 2019-01-23 DIAGNOSIS — I10 Essential (primary) hypertension: Secondary | ICD-10-CM

## 2019-01-23 LAB — BAYER DCA HB A1C WAIVED: HB A1C (BAYER DCA - WAIVED): 7 % — ABNORMAL HIGH (ref <7.0)

## 2019-01-23 LAB — CBC
Hematocrit: 40.6 % (ref 34.0–46.6)
Hemoglobin: 13.6 g/dL (ref 11.1–15.9)
MCH: 31 pg (ref 26.6–33.0)
MCHC: 33.5 g/dL (ref 31.5–35.7)
MCV: 93 fL (ref 79–97)
Platelets: 504 10*3/uL — ABNORMAL HIGH (ref 150–450)
RBC: 4.39 x10E6/uL (ref 3.77–5.28)
RDW: 11.9 % (ref 11.7–15.4)
WBC: 9.1 10*3/uL (ref 3.4–10.8)

## 2019-01-23 MED ORDER — ATORVASTATIN CALCIUM 10 MG PO TABS: 10.0000 mg | ORAL_TABLET | Freq: Every day | ORAL | 12 refills | Status: DC

## 2019-01-23 NOTE — Progress Notes (Signed)
Subjective: CC: est care, DM2 PCP: [name], DO [id number] Carrie [name] is a 58 y.o. female presenting to clinic today for:  1. Type 2 Diabetes w/ HTN & HLD:  Patient reports that she has been doing well on the Jardiance 10 mg daily that was added by Dr. Buddy Duty.  She has a follow-up with him next month.  She continues to take 60 units of Lantus every morning and also 1 mg of Ozempic every week.  For blood pressure she is on Micardis HCTZ 40-12.5 mg daily.  She has history of intolerance to metformin secondary to GI side effects.  She is not on a statin and notes that she is never been offered cholesterol medication in the past. High at home: 130; Low at home: 119.  Last eye exam: needs.  She sees Dr. Marin Comment at [facility]  She has not had an eye exam greater than 1 year Last foot exam: needs Last A1c:  Lab Results  Component Value Date   HGBA1C 7.3 (H) 10/07/2018   Nephropathy screen indicated?: on ARB Last flu, zoster and/or pneumovax: needs PNA vaccine Immunization History  Administered Date(s) Administered  . Influenza,inj,Quad PF,6+ Mos 07/07/2016, 03/28/2017, 04/02/2018  . Influenza-Unspecified 04/17/2016    ROS: Denies polyuria, polydipsia, unintended weight loss/gain, foot ulcerations, numbness or tingling in extremities, shortness of breath or chest pain.   No Known Allergies Past Medical History:  Diagnosis Date  . Depression   . Diabetes mellitus without complication ([place])   . Fever blister   . Gastroparesis   . Hyperlipidemia   . Hypertension   . Migraine     Current Outpatient Medications:  .  fluticasone (FLONASE) 50 MCG/ACT nasal spray, Place 2 sprays into both nostrils daily., Disp: 16 g, Rfl: 11 .  Insulin Glargine (LANTUS SOLOSTAR) 100 UNIT/ML Solostar Pen, Inject 60-90 Units into the skin daily. Start at 30 U, increase by one unit daily until FBS is 100, Disp: 30 mL, Rfl: 11 .  Insulin Pen Needle (RELION PEN NEEDLES) 32G X 4 MM MISC, USE TO INJECT MEDICATION  ONCE DAILY Dx 10.9, Disp: 100 each, Rfl: 3 .  loratadine (CLARITIN) 10 MG tablet, Take 1 tablet (10 mg total) by mouth daily., Disp: 90 tablet, Rfl: 3 .  Naltrexone-buPROPion HCl ER (CONTRAVE) 8-90 MG TB12, Take 1 tablet by mouth 3 (three) times daily., Disp: 90 tablet, Rfl: 5 .  olmesartan (BENICAR) 20 MG tablet, Take 1 tablet (20 mg total) by mouth daily., Disp: 30 tablet, Rfl: 5 .  omeprazole (PRILOSEC) 40 MG capsule, Take 1 capsule (40 mg total) by mouth daily., Disp: 30 capsule, Rfl: 11 .  ondansetron (ZOFRAN ODT) 8 MG disintegrating tablet, Take 1 tablet (8 mg total) by mouth every 8 (eight) hours as needed for nausea or vomiting., Disp: 20 tablet, Rfl: 0 .  PARoxetine (PAXIL) 30 MG tablet, TAKE 1 TABLET BY MOUTH ONCE DAILY (NEEDS  TO  BE  SEEN), Disp: 30 tablet, Rfl: 11 .  Semaglutide, 1 MG/DOSE, (OZEMPIC, 1 MG/DOSE,) 2 MG/1.5ML SOPN, Inject 1 mg into the skin once a week., Disp: 2 pen, Rfl: 6 .  SUMAtriptan (IMITREX) 100 MG tablet, TAKE 1 TABLET EVERY 2 HOURS AS NEEDED FOR MIGRAINES, Disp: 10 tablet, Rfl: 11 .  telmisartan-hydrochlorothiazide (MICARDIS HCT) 40-12.5 MG tablet, Take 1 tablet by mouth once daily, Disp: 90 tablet, Rfl: 1 .  valACYclovir (VALTREX) 1000 MG tablet, Take 1 tablet (1,000 mg total) by mouth 3 (three) times daily., Disp: 21 tablet,  Rfl: 0 Social History   Socioeconomic History  . Marital status: Married    Spouse name: Not on file  . Number of children: 2  . Years of education: Not on file  . Highest education level: Not on file  Occupational History  . Occupation: Therapist, art  Social Needs  . Financial resource strain: Not on file  . Food insecurity    Worry: Not on file    Inability: Not on file  . Transportation needs    Medical: Not on file    Non-medical: Not on file  Tobacco Use  . Smoking status: Never Smoker  . Smokeless tobacco: Never Used  Substance and Sexual Activity  . Alcohol use: Yes    Comment: occasional   . Drug use: No  .  Sexual activity: Not on file  Lifestyle  . Physical activity    Days per week: Not on file    Minutes per session: Not on file  . Stress: Not on file  Relationships  . Social Herbalist on phone: Not on file    Gets together: Not on file    Attends religious service: Not on file    Active member of club or organization: Not on file    Attends meetings of clubs or organizations: Not on file    Relationship status: Not on file  . Intimate partner violence    Fear of current or ex partner: Not on file    Emotionally abused: Not on file    Physically abused: Not on file    Forced sexual activity: Not on file  Other Topics Concern  . Not on file  Social History Narrative   .      Family History  Problem Relation Age of Onset  . Ovarian cancer Mother   . Mental illness Daughter   . Migraines Daughter   . Colon cancer Neg Hx   . Rectal cancer Neg Hx   . Throat cancer Neg Hx     Objective: Office vital signs reviewed. BP 119/75   Pulse 84   Temp 98.6 F (37 C) (Oral)   Ht 5\' 6"  (1.676 m)   Wt 191 lb (86.6 kg)   BMI 30.83 kg/m   Physical Examination:  General: Awake, alert, well nourished, No acute distress HEENT: Normal, sclera white.  Cardio: regular rate and rhythm, S1S2 heard, no murmurs appreciated Pulm: clear to auscultation bilaterally, no wheezes, rhonchi or rales; normal work of breathing on room air Extremities: warm, well perfused, No edema, cyanosis or clubbing; +2 pulses bilaterally MSK: normal gait and station Skin: dry; intact; no rashes or lesions Neuro: see DM foot exam  Diabetic Foot Exam - Simple   Simple Foot Form Diabetic Foot exam was performed with the following findings: Yes 01/23/2019  9:48 AM  Visual Inspection No deformities, no ulcerations, no other skin breakdown bilaterally: Yes Sensation Testing See comments: Yes Pulse Check Posterior Tibialis and Dorsalis pulse intact bilaterally: Yes Comments She has decreased  monofilament sensation and vibratory sensation to the left foot. (Chronic issue for her given back surgery) Right foot with intact sensation.  Vibratory sensation intact on the right.  No skin breakdown, pre-ulcerative calluses.    The 10-year ASCVD risk score Mikey Bussing DC Brooke Bonito., et al., 2013) is: 4.9%   Values used to calculate the score:     Age: 49 years     Sex: Female     Is Non-Hispanic African American: No  Diabetic: Yes     Tobacco smoker: No     Systolic Blood Pressure: 123456 mmHg     Is BP treated: Yes     HDL Cholesterol: 52 mg/dL     Total Cholesterol: 178 mg/dL   Assessment/ Plan: 58 y.o. female   1. Type 2 diabetes mellitus without complication, without long-term current use of insulin (HCC) Controlled with A1c of 7.0 today.  She is followed by endocrinology, who she reports is going to work on reducing her insulin and ultimately getting her off of this.  Likely will need increased dose of Jardiance to offset what the Lantus is doing for her from a sugar standpoint.  Will defer this to endocrinology.  She has follow-up next month.  I advised her to get her diabetic eye exam done.  Her foot exam was performed today.  Pneumococcal vaccination and tetanus vaccination administered today.  We also has started a statin.  Plan for repeat LFTs and fasting lipid panel in 3 months. - Bayer DCA Hb A1c Waived  2. Hypertension associated with diabetes ([place]) Controlled.  Continue current regimen  3. Hyperlipidemia associated with type 2 diabetes mellitus ([place]) As above.  ASCVD risk score for the next 10 years was 4.9% - atorvastatin (LIPITOR) 10 MG tablet; Take 1 tablet (10 mg total) by mouth daily.  Dispense: 30 tablet; Refill: 12  4. Morbid obesity ([place]) Agree with reduction of insulin, which will hopefully help with weight  5. Thrombocytosis (HCC) Platelets noted to be greater than 500 last check.  Check CBC - CBC   Orders Placed This Encounter  Procedures  . Tdap vaccine  greater than or equal to 7yo IM  . Pneumococcal polysaccharide vaccine 23-valent greater than or equal to 2yo subcutaneous/IM  . Bayer DCA Hb A1c Waived  . CBC   Meds ordered this encounter  Medications  . atorvastatin (LIPITOR) 10 MG tablet    Sig: Take 1 tablet (10 mg total) by mouth daily.    Dispense:  30 tablet    Refill:  [place], [place] [phone]

## 2019-01-23 NOTE — Patient Instructions (Addendum)
Have your eye exam results sent to me.  If you've had a pap smear (screen for cervical cancer) in the last 3 years, please have your specialist send me the results.  If not, please schedule an appointment to have this test done with your OB/GYN.  Flu shots will be available in October.  Please schedule your flu shot.  We are starting Lipitor 10mg  daily.  Take 1 tablet every night at bedtime for cholesterol.  Your sugar is at GOAL!  Yippee!!!   Diabetes Mellitus and Standards of Medical Care Managing diabetes (diabetes mellitus) can be complicated. Your diabetes treatment may be managed by a team of health care providers, including:  A physician who specializes in diabetes (endocrinologist).  A nurse practitioner or physician assistant.  Nurses.  A diet and nutrition specialist (registered dietitian).  A certified diabetes educator (CDE).  An exercise specialist.  A pharmacist.  An eye doctor.  A foot specialist (podiatrist).  A dentist.  A primary care provider.  A mental health provider. Your health care providers follow guidelines to help you get the best quality of care. The following schedule is a general guideline for your diabetes management plan. Your health care providers may give you more specific instructions. Physical exams Upon being diagnosed with diabetes mellitus, and each year after that, your health care provider will ask about your medical and family history. He or she will also do a physical exam. Your exam may include:  Measuring your height, weight, and body mass index (BMI).  Checking your blood pressure. This will be done at every routine medical visit. Your target blood pressure may vary depending on your medical conditions, your age, and other factors.  Thyroid gland exam.  Skin exam.  Screening for damage to your nerves (peripheral neuropathy). This may include checking the pulse in your legs and feet and checking the level of sensation in  your hands and feet.  A complete foot exam to inspect the structure and skin of your feet, including checking for cuts, bruises, redness, blisters, sores, or other problems.  Screening for blood vessel (vascular) problems, which may include checking the pulse in your legs and feet and checking your temperature. Blood tests Depending on your treatment plan and your personal needs, you may have the following tests done:  HbA1c (hemoglobin A1c). This test provides information about blood sugar (glucose) control over the previous 2-3 months. It is used to adjust your treatment plan, if needed. This test will be done: ? At least 2 times a year, if you are meeting your treatment goals. ? 4 times a year, if you are not meeting your treatment goals or if treatment goals have changed.  Lipid testing, including total, LDL, and HDL cholesterol and triglyceride levels. ? The goal for LDL is less than 100 mg/dL (5.5 mmol/L). If you are at high risk for complications, the goal is less than 70 mg/dL (3.9 mmol/L). ? The goal for HDL is 40 mg/dL (2.2 mmol/L) or higher for men and 50 mg/dL (2.8 mmol/L) or higher for women. An HDL cholesterol of 60 mg/dL (3.3 mmol/L) or higher gives some protection against heart disease. ? The goal for triglycerides is less than 150 mg/dL (8.3 mmol/L).  Liver function tests.  Kidney function tests.  Thyroid function tests. Dental and eye exams  Visit your dentist two times a year.  If you have type 1 diabetes, your health care provider may recommend an eye exam 3-5 years after you are diagnosed, and  then once a year after your first exam. ? For children with type 1 diabetes, a health care provider may recommend an eye exam when your child is age 48 or older and has had diabetes for 3-5 years. After the first exam, your child should get an eye exam once a year.  If you have type 2 diabetes, your health care provider may recommend an eye exam as soon as you are diagnosed,  and then once a year after your first exam. Immunizations   The yearly flu (influenza) vaccine is recommended for everyone 6 months or older who has diabetes.  The pneumonia (pneumococcal) vaccine is recommended for everyone 2 years or older who has diabetes. If you are 58 or older, you may get the pneumonia vaccine as a series of two separate shots.  The hepatitis B vaccine is recommended for adults shortly after being diagnosed with diabetes.  Adults and children with diabetes should receive all other vaccines according to age-specific recommendations from the Centers for Disease Control and Prevention (CDC). Mental and emotional health Screening for symptoms of eating disorders, anxiety, and depression is recommended at the time of diagnosis and afterward as needed. If your screening shows that you have symptoms (positive screening result), you may need more evaluation and you may work with a mental health care provider. Treatment plan Your treatment plan will be reviewed at every medical visit. You and your health care provider will discuss:  How you are taking your medicines, including insulin.  Any side effects you are experiencing.  Your blood glucose target goals.  The frequency of your blood glucose monitoring.  Lifestyle habits, such as activity level as well as tobacco, alcohol, and substance use. Diabetes self-management education Your health care provider will assess how well you are monitoring your blood glucose levels and whether you are taking your insulin correctly. He or she may refer you to:  A certified diabetes educator to manage your diabetes throughout your life, starting at diagnosis.  A registered dietitian who can create or review your personal nutrition plan.  An exercise specialist who can discuss your activity level and exercise plan. Summary  Managing diabetes (diabetes mellitus) can be complicated. Your diabetes treatment may be managed by a team of  health care providers.  Your health care providers follow guidelines in order to help you get the best quality of care.  Standards of care including having regular physical exams, blood tests, blood pressure monitoring, immunizations, screening tests, and education about how to manage your diabetes.  Your health care providers may also give you more specific instructions based on your individual health. This information is not intended to replace advice given to you by your health care provider. Make sure you discuss any questions you have with your health care provider. Document Released: 03/19/2009 Document Revised: 02/08/2018 Document Reviewed: 02/18/2016 Elsevier Patient Education  2020 [employer].

## 2019-02-11 DIAGNOSIS — Z794 Long term (current) use of insulin: Secondary | ICD-10-CM | POA: Diagnosis not present

## 2019-02-11 DIAGNOSIS — E669 Obesity, unspecified: Secondary | ICD-10-CM | POA: Diagnosis not present

## 2019-02-11 DIAGNOSIS — E1165 Type 2 diabetes mellitus with hyperglycemia: Secondary | ICD-10-CM | POA: Diagnosis not present

## 2019-02-12 ENCOUNTER — Other Ambulatory Visit: Payer: Self-pay | Admitting: Physician Assistant

## 2019-02-12 DIAGNOSIS — E109 Type 1 diabetes mellitus without complications: Secondary | ICD-10-CM

## 2019-02-19 ENCOUNTER — Other Ambulatory Visit: Payer: Self-pay | Admitting: Physician Assistant

## 2019-02-19 DIAGNOSIS — E109 Type 1 diabetes mellitus without complications: Secondary | ICD-10-CM

## 2019-03-12 ENCOUNTER — Other Ambulatory Visit: Payer: Self-pay | Admitting: Physician Assistant

## 2019-03-12 DIAGNOSIS — E109 Type 1 diabetes mellitus without complications: Secondary | ICD-10-CM

## 2019-03-28 ENCOUNTER — Other Ambulatory Visit: Payer: Self-pay

## 2019-03-28 ENCOUNTER — Ambulatory Visit
Admission: RE | Admit: 2019-03-28 | Discharge: 2019-03-28 | Disposition: A | Discharge status: Home / Self Care | Payer: BLUE CROSS/BLUE SHIELD | Source: Ambulatory Visit | Attending: Obstetrics and Gynecology | Admitting: Obstetrics and Gynecology

## 2019-03-28 DIAGNOSIS — R928 Other abnormal and inconclusive findings on diagnostic imaging of breast: Secondary | ICD-10-CM | POA: Diagnosis not present

## 2019-03-28 DIAGNOSIS — N6489 Other specified disorders of breast: Secondary | ICD-10-CM

## 2019-04-11 ENCOUNTER — Other Ambulatory Visit: Payer: Self-pay | Admitting: Family Medicine

## 2019-04-11 DIAGNOSIS — E109 Type 1 diabetes mellitus without complications: Secondary | ICD-10-CM

## 2019-04-11 NOTE — Telephone Encounter (Signed)
done

## 2019-04-24 ENCOUNTER — Telehealth: Payer: Self-pay | Admitting: Family Medicine

## 2019-04-24 ENCOUNTER — Other Ambulatory Visit: Payer: Self-pay | Admitting: *Deleted

## 2019-04-25 ENCOUNTER — Other Ambulatory Visit: Payer: Self-pay

## 2019-04-25 ENCOUNTER — Encounter: Payer: Self-pay | Admitting: Family Medicine

## 2019-04-25 ENCOUNTER — Ambulatory Visit: Payer: BC Managed Care – PPO | Admitting: Family Medicine

## 2019-04-25 VITALS — BP 111/70 | HR 84 | Temp 98.4°F | Ht 66.0 in | Wt 191.0 lb

## 2019-04-25 DIAGNOSIS — E785 Hyperlipidemia, unspecified: Secondary | ICD-10-CM | POA: Diagnosis not present

## 2019-04-25 DIAGNOSIS — D75839 Thrombocytosis, unspecified: Secondary | ICD-10-CM

## 2019-04-25 DIAGNOSIS — I10 Essential (primary) hypertension: Secondary | ICD-10-CM

## 2019-04-25 DIAGNOSIS — E1159 Type 2 diabetes mellitus with other circulatory complications: Secondary | ICD-10-CM

## 2019-04-25 DIAGNOSIS — D473 Essential (hemorrhagic) thrombocythemia: Secondary | ICD-10-CM

## 2019-04-25 DIAGNOSIS — E1169 Type 2 diabetes mellitus with other specified complication: Secondary | ICD-10-CM | POA: Diagnosis not present

## 2019-04-25 DIAGNOSIS — E119 Type 2 diabetes mellitus without complications: Secondary | ICD-10-CM | POA: Diagnosis not present

## 2019-04-25 DIAGNOSIS — I152 Hypertension secondary to endocrine disorders: Secondary | ICD-10-CM

## 2019-04-25 LAB — BAYER DCA HB A1C WAIVED: HB A1C (BAYER DCA - WAIVED): 7.7 % — ABNORMAL HIGH (ref <7.0)

## 2019-04-25 MED ORDER — JARDIANCE 25 MG PO TABS: 25.0000 mg | ORAL_TABLET | Freq: Every day | ORAL | 0 refills | Status: DC

## 2019-04-25 NOTE — Progress Notes (Signed)
Subjective: CC: est care, DM2 PCP: [name], DO IHW:TUUE RAKIYA KRAWCZYK is a 58 y.o. female presenting to clinic today for:  1. Type 2 Diabetes w/ HTN & HLD:  Patient reports that she missed her follow-up visit with her endocrinologist due to other coworkers being out for [employer].  She continues to take Jardiance 10 mg daily, 60 units of Lantus every morning and also 1 mg of Ozempic every week.  She is compliant with Micardis HCTZ 40-12.5 mg and Lipitor 10 mg daily.  She admits to eating more sweets lately.  She has been stress eating.  Denies any intolerance to the Lipitor.  No myalgia, change in urination.  She occasionally is thirsty.  She has chronic numbness and tingling in her feet but denies any ulcerations or pain.  No chest pain or shortness of breath but she does struggle intermittently with chest tightness that has been a chronic issue and seems to be anxiety related.  Last eye exam: needs.  She sees Dr. Marin Comment at [facility].  She has not had an eye exam greater than 1 year Last foot exam: Up-to-date Last A1c:  Lab Results  Component Value Date   HGBA1C 7.0 (H) 01/23/2019   Nephropathy screen indicated?: on ARB Last flu, zoster and/or pneumovax: Up-to-date Immunization History  Administered Date(s) Administered  . Influenza,inj,Quad PF,6+ Mos 07/07/2016, 03/28/2017, 04/02/2018, 04/07/2019  . Influenza-Unspecified 04/17/2016  . Pneumococcal Polysaccharide-23 01/23/2019  . Tdap 01/23/2019    No Known Allergies Past Medical History:  Diagnosis Date  . Depression   . Diabetes mellitus without complication ([place])   . Fever blister   . Gastroparesis   . Hyperlipidemia   . Hypertension   . Migraine     Current Outpatient Medications:  .  atorvastatin (LIPITOR) 10 MG tablet, Take 1 tablet (10 mg total) by mouth daily., Disp: 30 tablet, Rfl: 12 .  empagliflozin (JARDIANCE) 10 MG TABS tablet, Take 10 mg by mouth daily., Disp: , Rfl:  .  Insulin Pen Needle (RELION PEN NEEDLES)  32G X 4 MM MISC, USE TO INJECT MEDICATION ONCE DAILY Dx 10.9, Disp: 100 each, Rfl: 3 .  LANTUS SOLOSTAR 100 UNIT/ML Solostar Pen, INJECT 30 TO 60 UNITS SUBCUTANEOUSLY ONCE DAILY. START AT 30 UNITS, INCREASE BY ONE UNIT DAILY UNTIL FBS IS 100, Disp: 15 mL, Rfl: 0 .  omeprazole (PRILOSEC) 40 MG capsule, Take 1 capsule (40 mg total) by mouth daily., Disp: 30 capsule, Rfl: 11 .  ondansetron (ZOFRAN ODT) 8 MG disintegrating tablet, Take 1 tablet (8 mg total) by mouth every 8 (eight) hours as needed for nausea or vomiting., Disp: 20 tablet, Rfl: 0 .  OZEMPIC, 1 MG/DOSE, 2 MG/1.5ML SOPN, INJECT 1 MG INTO THE SKIN ONCE A WEEK, Disp: 18 mL, Rfl: 0 .  PARoxetine (PAXIL) 30 MG tablet, TAKE 1 TABLET BY MOUTH ONCE DAILY (NEEDS  TO  BE  SEEN), Disp: 30 tablet, Rfl: 11 .  SUMAtriptan (IMITREX) 100 MG tablet, TAKE 1 TABLET EVERY 2 HOURS AS NEEDED FOR MIGRAINES, Disp: 10 tablet, Rfl: 11 .  telmisartan-hydrochlorothiazide (MICARDIS HCT) 40-12.5 MG tablet, Take 1 tablet by mouth once daily, Disp: 90 tablet, Rfl: 1 Social History   Socioeconomic History  . Marital status: Married    Spouse name: Not on file  . Number of children: 2  . Years of education: Not on file  . Highest education level: Not on file  Occupational History  . Occupation: Therapist, art  Social Needs  . Financial  resource strain: Not on file  . Food insecurity    Worry: Not on file    Inability: Not on file  . Transportation needs    Medical: Not on file    Non-medical: Not on file  Tobacco Use  . Smoking status: Never Smoker  . Smokeless tobacco: Never Used  Substance and Sexual Activity  . Alcohol use: Yes    Comment: occasional   . Drug use: No  . Sexual activity: Not on file  Lifestyle  . Physical activity    Days per week: Not on file    Minutes per session: Not on file  . Stress: Not on file  Relationships  . Social Herbalist on phone: Not on file    Gets together: Not on file    Attends religious  service: Not on file    Active member of club or organization: Not on file    Attends meetings of clubs or organizations: Not on file    Relationship status: Not on file  . Intimate partner violence    Fear of current or ex partner: Not on file    Emotionally abused: Not on file    Physically abused: Not on file    Forced sexual activity: Not on file  Other Topics Concern  . Not on file  Social History Narrative   .      Family History  Problem Relation Age of Onset  . Ovarian cancer Mother   . Mental illness Daughter   . Migraines Daughter   . Colon cancer Neg Hx   . Rectal cancer Neg Hx   . Throat cancer Neg Hx     Objective: Office vital signs reviewed. BP 111/70   Pulse 84   Temp 98.4 F (36.9 C) (Temporal)   Ht 5' 6"  (1.676 m)   Wt 191 lb (86.6 kg)   SpO2 96%   BMI 30.83 kg/m   Physical Examination:  General: Awake, alert, well nourished, No acute distress HEENT: Normal, sclera white.  No carotid bruits Cardio: regular rate and rhythm, S1S2 heard, no murmurs appreciated Pulm: clear to auscultation bilaterally, no wheezes, rhonchi or rales; normal work of breathing on room air Extremities: warm, well perfused, No edema, cyanosis or clubbing; +2 pulses bilaterally MSK: normal gait and station Skin: dry; intact; no rashes or lesions  The 10-year ASCVD risk score Mikey Bussing DC Jr., et al., 2013) is: 5.4%   Values used to calculate the score:     Age: 46 years     Sex: Female     Is Non-Hispanic African American: No     Diabetic: Yes     Tobacco smoker: No     Systolic Blood Pressure: 883 mmHg     Is BP treated: Yes     HDL Cholesterol: 52 mg/dL     Total Cholesterol: 178 mg/dL   Assessment/ Plan: 58 y.o. female   1. Hyperlipidemia associated with type 2 diabetes mellitus ([place]) Tolerating statin without difficulty.  Check CMP and lipid panel. - CMP14+EGFR - Lipid Panel  2. Hypertension associated with diabetes ([place]) Controlled.  Continue current regimen  - CMP14+EGFR  3. Type 2 diabetes mellitus without complication, without long-term current use of insulin (HCC) A1c is no longer at goal.  It has gone up to 7.7.  I will CC her endocrinologist with these results.  I have gone ahead and increased her Jardiance to 25 mg daily.  She is aware of change.  She is to schedule follow-up visit with him.  She is also to schedule her diabetic eye exam. - Bayer DCA Hb A1c Waived - empagliflozin (JARDIANCE) 25 MG TABS tablet; Take 25 mg by mouth daily before breakfast.  Dispense: 90 tablet; Refill: 0  4. Thrombocytosis ([place]) Check CBC - CBC   Orders Placed This Encounter  Procedures  . CMP14+EGFR  . Lipid Panel  . Bayer DCA Hb A1c Waived  . CBC   No orders of the defined types were placed in this encounter.    [name], DO [place] [phone]

## 2019-04-26 LAB — CMP14+EGFR
ALT: 18 IU/L (ref 0–32)
AST: 15 IU/L (ref 0–40)
Albumin/Globulin Ratio: 1.6 (ref 1.2–2.2)
Albumin: 4.5 g/dL (ref 3.8–4.9)
Alkaline Phosphatase: 93 IU/L (ref 39–117)
BUN/Creatinine Ratio: 20 (ref 9–23)
BUN: 17 mg/dL (ref 6–24)
Bilirubin Total: 0.8 mg/dL (ref 0.0–1.2)
CO2: 23 mmol/L (ref 20–29)
Calcium: 10.2 mg/dL (ref 8.7–10.2)
Chloride: 97 mmol/L (ref 96–106)
Creatinine, Ser: 0.83 mg/dL (ref 0.57–1.00)
GFR calc Af Amer: 89 mL/min/{1.73_m2} (ref >59)
GFR calc non Af Amer: 77 mL/min/{1.73_m2} (ref >59)
Globulin, Total: 2.8 g/dL (ref 1.5–4.5)
Glucose: 173 mg/dL — ABNORMAL HIGH (ref 65–99)
Potassium: 4.5 mmol/L (ref 3.5–5.2)
Sodium: 138 mmol/L (ref 134–144)
Total Protein: 7.3 g/dL (ref 6.0–8.5)

## 2019-04-26 LAB — LIPID PANEL
Chol/HDL Ratio: 2.4 ratio (ref 0.0–4.4)
Cholesterol, Total: 142 mg/dL (ref 100–199)
HDL: 59 mg/dL (ref >39)
LDL Chol Calc (NIH): 67 mg/dL (ref 0–99)
Triglycerides: 84 mg/dL (ref 0–149)
VLDL Cholesterol Cal: 16 mg/dL (ref 5–40)

## 2019-04-26 LAB — CBC
Hematocrit: 43.3 % (ref 34.0–46.6)
Hemoglobin: 14.8 g/dL (ref 11.1–15.9)
MCH: 31.2 pg (ref 26.6–33.0)
MCHC: 34.2 g/dL (ref 31.5–35.7)
MCV: 91 fL (ref 79–97)
Platelets: 548 10*3/uL — ABNORMAL HIGH (ref 150–450)
RBC: 4.74 x10E6/uL (ref 3.77–5.28)
RDW: 12.1 % (ref 11.7–15.4)
WBC: 11.4 10*3/uL — ABNORMAL HIGH (ref 3.4–10.8)

## 2019-04-28 DIAGNOSIS — Z01419 Encounter for gynecological examination (general) (routine) without abnormal findings: Secondary | ICD-10-CM | POA: Diagnosis not present

## 2019-04-28 DIAGNOSIS — Z130 Encounter for screening for diseases of the blood and blood-forming organs and certain disorders involving the immune mechanism: Secondary | ICD-10-CM | POA: Diagnosis not present

## 2019-04-28 DIAGNOSIS — Z124 Encounter for screening for malignant neoplasm of cervix: Secondary | ICD-10-CM | POA: Diagnosis not present

## 2019-04-28 DIAGNOSIS — E669 Obesity, unspecified: Secondary | ICD-10-CM | POA: Diagnosis not present

## 2019-04-28 DIAGNOSIS — Z1389 Encounter for screening for other disorder: Secondary | ICD-10-CM | POA: Diagnosis not present

## 2019-04-29 ENCOUNTER — Other Ambulatory Visit: Payer: Self-pay | Admitting: Family Medicine

## 2019-04-29 DIAGNOSIS — D75839 Thrombocytosis, unspecified: Secondary | ICD-10-CM

## 2019-04-29 DIAGNOSIS — D473 Essential (hemorrhagic) thrombocythemia: Secondary | ICD-10-CM

## 2019-04-29 NOTE — Progress Notes (Signed)
I spoke to patient with regards to her chronically elevated platelet count.  We discussed the risk of clotting given this high platelet count.  I have recommended that she start a baby aspirin 81 mg daily until she is seen by hematology.  We discussed the possible differential diagnoses.  She does note that if it were to be something "bad" she may not want to know but she does want to see the specialist because if it something as easy is taking an antiplatelet every day to prevent stroke and heart attack she certainly would want to do this.

## 2019-04-30 ENCOUNTER — Telehealth: Payer: Self-pay | Admitting: Hematology and Oncology

## 2019-04-30 NOTE — Telephone Encounter (Signed)
Received a new patient referral form Dr. Lajuana Ripple for thrombocytosis. Pt has been cld and scheduled to see Dr. Lorenso Courier on 12/9 at 9am. Pt aware to arrive 15 minutes early.

## 2019-05-08 ENCOUNTER — Other Ambulatory Visit: Payer: Self-pay | Admitting: Family Medicine

## 2019-05-08 DIAGNOSIS — E109 Type 1 diabetes mellitus without complications: Secondary | ICD-10-CM

## 2019-05-10 DIAGNOSIS — R519 Headache, unspecified: Secondary | ICD-10-CM | POA: Diagnosis not present

## 2019-05-10 DIAGNOSIS — Z20828 Contact with and (suspected) exposure to other viral communicable diseases: Secondary | ICD-10-CM | POA: Diagnosis not present

## 2019-05-10 DIAGNOSIS — M791 Myalgia, unspecified site: Secondary | ICD-10-CM | POA: Diagnosis not present

## 2019-05-10 DIAGNOSIS — R0981 Nasal congestion: Secondary | ICD-10-CM | POA: Diagnosis not present

## 2019-05-12 ENCOUNTER — Telehealth: Payer: Self-pay | Admitting: Hematology and Oncology

## 2019-05-12 ENCOUNTER — Other Ambulatory Visit: Payer: Self-pay | Admitting: Physician Assistant

## 2019-05-12 DIAGNOSIS — F324 Major depressive disorder, single episode, in partial remission: Secondary | ICD-10-CM

## 2019-05-12 DIAGNOSIS — E109 Type 1 diabetes mellitus without complications: Secondary | ICD-10-CM

## 2019-05-12 NOTE — Telephone Encounter (Signed)
Returned patient's phone call regarding rescheduling an appointment, left a voicemail. 

## 2019-05-13 ENCOUNTER — Telehealth: Payer: Self-pay | Admitting: Hematology and Oncology

## 2019-05-13 NOTE — Telephone Encounter (Signed)
Pt cld to reschedule her appt w/Dr. Lorenso Courier to 06/09/19 at 9am due to exposure to Covid.

## 2019-05-14 ENCOUNTER — Inpatient Hospital Stay: Payer: BC Managed Care – PPO | Admitting: Hematology and Oncology

## 2019-05-14 ENCOUNTER — Inpatient Hospital Stay: Payer: BC Managed Care – PPO

## 2019-05-23 ENCOUNTER — Other Ambulatory Visit: Payer: Self-pay | Admitting: Family Medicine

## 2019-05-23 ENCOUNTER — Telehealth: Payer: Self-pay | Admitting: Family Medicine

## 2019-05-23 DIAGNOSIS — Z20828 Contact with and (suspected) exposure to other viral communicable diseases: Secondary | ICD-10-CM | POA: Diagnosis not present

## 2019-05-23 DIAGNOSIS — R05 Cough: Secondary | ICD-10-CM

## 2019-05-23 DIAGNOSIS — R059 Cough, unspecified: Secondary | ICD-10-CM

## 2019-05-23 DIAGNOSIS — R197 Diarrhea, unspecified: Secondary | ICD-10-CM | POA: Diagnosis not present

## 2019-05-23 DIAGNOSIS — R0989 Other specified symptoms and signs involving the circulatory and respiratory systems: Secondary | ICD-10-CM | POA: Diagnosis not present

## 2019-05-23 DIAGNOSIS — Z20822 Contact with and (suspected) exposure to covid-19: Secondary | ICD-10-CM

## 2019-05-23 MED ORDER — BENZONATATE 200 MG PO CAPS: 200.0000 mg | ORAL_CAPSULE | Freq: Two times a day (BID) | ORAL | 0 refills | Status: DC | PRN

## 2019-05-23 NOTE — Telephone Encounter (Signed)
Would recommend recheck for COVID19 now that she is symptomatic and given several family members positive. Will send in cough medication.  If worsening, needs to get checked out at [facility] to rule out secondary pneumonia.

## 2019-05-23 NOTE — Telephone Encounter (Signed)
Patient notified. Gave her the number to schedule Covid testing if she chooses to. Also advised to go to Urgent Care if she feels in distress. Patient verbalized understanding

## 2019-05-29 ENCOUNTER — Other Ambulatory Visit: Payer: Self-pay | Admitting: Physician Assistant

## 2019-06-02 ENCOUNTER — Other Ambulatory Visit: Payer: Self-pay | Admitting: Family Medicine

## 2019-06-02 DIAGNOSIS — E109 Type 1 diabetes mellitus without complications: Secondary | ICD-10-CM

## 2019-06-03 ENCOUNTER — Telehealth: Payer: Self-pay | Admitting: Hematology and Oncology

## 2019-06-03 NOTE — Telephone Encounter (Signed)
Returned patient's phone call regarding rescheduling 01/04 appointments, per patient's request appointment has moved to 01/29.

## 2019-06-09 ENCOUNTER — Inpatient Hospital Stay: Payer: BC Managed Care – PPO

## 2019-06-09 ENCOUNTER — Inpatient Hospital Stay: Payer: BC Managed Care – PPO | Admitting: Hematology and Oncology

## 2019-06-16 DIAGNOSIS — Z794 Long term (current) use of insulin: Secondary | ICD-10-CM | POA: Diagnosis not present

## 2019-06-16 DIAGNOSIS — E669 Obesity, unspecified: Secondary | ICD-10-CM | POA: Diagnosis not present

## 2019-06-16 DIAGNOSIS — Z7189 Other specified counseling: Secondary | ICD-10-CM | POA: Diagnosis not present

## 2019-06-16 DIAGNOSIS — E1165 Type 2 diabetes mellitus with hyperglycemia: Secondary | ICD-10-CM | POA: Diagnosis not present

## 2019-06-17 DIAGNOSIS — Z01419 Encounter for gynecological examination (general) (routine) without abnormal findings: Secondary | ICD-10-CM | POA: Diagnosis not present

## 2019-06-18 ENCOUNTER — Other Ambulatory Visit: Payer: Self-pay | Admitting: Family Medicine

## 2019-06-18 DIAGNOSIS — F324 Major depressive disorder, single episode, in partial remission: Secondary | ICD-10-CM

## 2019-06-18 NOTE — Telephone Encounter (Signed)
OV 07/28/19

## 2019-06-21 ENCOUNTER — Other Ambulatory Visit: Payer: Self-pay | Admitting: Physician Assistant

## 2019-06-21 DIAGNOSIS — G43809 Other migraine, not intractable, without status migrainosus: Secondary | ICD-10-CM

## 2019-06-30 ENCOUNTER — Other Ambulatory Visit: Payer: Self-pay | Admitting: Family Medicine

## 2019-06-30 DIAGNOSIS — E109 Type 1 diabetes mellitus without complications: Secondary | ICD-10-CM

## 2019-06-30 MED ORDER — LANTUS SOLOSTAR 100 UNIT/ML ~~LOC~~ SOPN: PEN_INJECTOR | SUBCUTANEOUS | 3 refills | Status: DC

## 2019-07-04 ENCOUNTER — Other Ambulatory Visit: Payer: Self-pay

## 2019-07-04 ENCOUNTER — Inpatient Hospital Stay: Payer: BC Managed Care – PPO | Attending: Hematology and Oncology

## 2019-07-04 ENCOUNTER — Inpatient Hospital Stay: Payer: BC Managed Care – PPO | Admitting: Hematology and Oncology

## 2019-07-04 VITALS — BP 103/67 | HR 95 | Temp 97.4°F | Resp 20 | Ht 66.0 in | Wt 191.1 lb

## 2019-07-04 DIAGNOSIS — R7989 Other specified abnormal findings of blood chemistry: Secondary | ICD-10-CM | POA: Diagnosis not present

## 2019-07-04 DIAGNOSIS — Z794 Long term (current) use of insulin: Secondary | ICD-10-CM | POA: Insufficient documentation

## 2019-07-04 DIAGNOSIS — Z87891 Personal history of nicotine dependence: Secondary | ICD-10-CM

## 2019-07-04 DIAGNOSIS — E119 Type 2 diabetes mellitus without complications: Secondary | ICD-10-CM | POA: Diagnosis not present

## 2019-07-04 DIAGNOSIS — I10 Essential (primary) hypertension: Secondary | ICD-10-CM

## 2019-07-04 DIAGNOSIS — Z79899 Other long term (current) drug therapy: Secondary | ICD-10-CM

## 2019-07-04 DIAGNOSIS — D473 Essential (hemorrhagic) thrombocythemia: Secondary | ICD-10-CM

## 2019-07-04 DIAGNOSIS — D75839 Thrombocytosis, unspecified: Secondary | ICD-10-CM

## 2019-07-04 DIAGNOSIS — E785 Hyperlipidemia, unspecified: Secondary | ICD-10-CM | POA: Diagnosis not present

## 2019-07-04 DIAGNOSIS — D72829 Elevated white blood cell count, unspecified: Secondary | ICD-10-CM | POA: Diagnosis not present

## 2019-07-04 DIAGNOSIS — F329 Major depressive disorder, single episode, unspecified: Secondary | ICD-10-CM | POA: Insufficient documentation

## 2019-07-04 LAB — CBC WITH DIFFERENTIAL (CANCER CENTER ONLY)
Abs Immature Granulocytes: 0.02 10*3/uL (ref 0.00–0.07)
Basophils Absolute: 0.1 10*3/uL (ref 0.0–0.1)
Basophils Relative: 1 %
Eosinophils Absolute: 0.2 10*3/uL (ref 0.0–0.5)
Eosinophils Relative: 2 %
HCT: 41.8 % (ref 36.0–46.0)
Hemoglobin: 13.7 g/dL (ref 12.0–15.0)
Immature Granulocytes: 0 %
Lymphocytes Relative: 28 %
Lymphs Abs: 3.2 10*3/uL (ref 0.7–4.0)
MCH: 31 pg (ref 26.0–34.0)
MCHC: 32.8 g/dL (ref 30.0–36.0)
MCV: 94.6 fL (ref 80.0–100.0)
Monocytes Absolute: 0.9 10*3/uL (ref 0.1–1.0)
Monocytes Relative: 8 %
Neutro Abs: 7.2 10*3/uL (ref 1.7–7.7)
Neutrophils Relative %: 61 %
Platelet Count: 500 10*3/uL — ABNORMAL HIGH (ref 150–400)
RBC: 4.42 MIL/uL (ref 3.87–5.11)
RDW: 13.4 % (ref 11.5–15.5)
WBC Count: 11.6 10*3/uL — ABNORMAL HIGH (ref 4.0–10.5)
nRBC: 0 % (ref 0.0–0.2)

## 2019-07-04 LAB — CMP (CANCER CENTER ONLY)
ALT: 19 U/L (ref 0–44)
AST: 17 U/L (ref 15–41)
Albumin: 4.5 g/dL (ref 3.5–5.0)
Alkaline Phosphatase: 74 U/L (ref 38–126)
Anion gap: 13 (ref 5–15)
BUN: 22 mg/dL — ABNORMAL HIGH (ref 6–20)
CO2: 26 mmol/L (ref 22–32)
Calcium: 9.6 mg/dL (ref 8.9–10.3)
Chloride: 102 mmol/L (ref 98–111)
Creatinine: 0.78 mg/dL (ref 0.44–1.00)
GFR, Est AFR Am: >60 mL/min (ref >60)
GFR, Estimated: >60 mL/min (ref >60)
Glucose, Bld: 102 mg/dL — ABNORMAL HIGH (ref 70–99)
Potassium: 3.9 mmol/L (ref 3.5–5.1)
Sodium: 141 mmol/L (ref 135–145)
Total Bilirubin: 0.7 mg/dL (ref 0.3–1.2)
Total Protein: 7.8 g/dL (ref 6.5–8.1)

## 2019-07-04 LAB — SEDIMENTATION RATE: Sed Rate: 24 mm/hr — ABNORMAL HIGH (ref 0–22)

## 2019-07-04 LAB — SAVE SMEAR(SSMR), FOR PROVIDER SLIDE REVIEW

## 2019-07-04 LAB — C-REACTIVE PROTEIN: CRP: 0.6 mg/dL (ref <1.0)

## 2019-07-04 NOTE — Progress Notes (Signed)
[place] Telephone:([phone]) [phone]   Fax:([phone]) [place] NOTE  Patient Care Team: [name], DO as PCP - General (Family Medicine)  Hematological/Oncological History # Thrombocytosis, chronic 1) 05/30/2017: WBC 11.7, Hgb 12.9, Plt 481. MCV 93. First CBC on our records 2) 04/25/2019: WBC 11.4, Hgb 14.8, Plt 548, MCV 91.  3) 1/29/201: Establish care with Dr. Lorenso Courier   CHIEF COMPLAINTS/PURPOSE OF CONSULTATION:   Thrombocytosis  HISTORY OF PRESENTING ILLNESS:  Carrie [name] 59 y.o. female with medical history significant for DM type II, HTN, and HLD who presents for evaluation of thrombocytosis.   On review of the previous records Carrie [name] has a thrombocytosis dating back to at least December 2018.  At that time she was noted to have a hemoglobin of 12.9 white blood cell count 11.7 platelets of 481.  She subsequently had platelets greater than 400 for the next 2 years on record.  She does have normal measurements of white blood cell counts, but periodically has elevations in white blood cells, typically never greater than 12.  Hemoglobin MCV have been perfectly normal within that time span.  The patient recently switched PCPs and was referred to hematology for further evaluation and management of the elevated platelet count.  On exam today Carrie [name] notes that she feels well.  She notes that she has been asymptomatic and notes that she has never had any known complications of a high platelet count.  She denies having any issues with blood clots.  She reports that as a child she was told she had anemia and had to take "trophic iron".  She notes that she went through menopause in 2005 and never had particularly heavy periods when she was menstruating.  She currently has no issues with blood loss that she is aware of, denying any nosebleeds, GI bleeding, or easy bruising.  The patient smoked on and off for approximately 40 years and quit in 2006.  She has  no other history of inflammatory conditions and only takes medications for type 2 diabetes hypertension hyperlipidemia.  Full 10 point ROS is listed below.  MEDICAL HISTORY:  Past Medical History:  Diagnosis Date  . Depression   . Diabetes mellitus without complication ([place])   . Fever blister   . Gastroparesis   . Hyperlipidemia   . Hypertension   . Migraine     SURGICAL HISTORY: Past Surgical History:  Procedure Laterality Date  . BACK SURGERY    . CHOLECYSTECTOMY  2004    SOCIAL HISTORY: Social History   Socioeconomic History  . Marital status: Married    Spouse name: Not on file  . Number of children: 2  . Years of education: Not on file  . Highest education level: Not on file  Occupational History  . Occupation: customer service  Tobacco Use  . Smoking status: Former Smoker    Packs/day: 0.50    Years: 40.00    Pack years: 20.00    Types: Cigarettes    Quit date: 2006    Years since quitting: 15.1  . Smokeless tobacco: Never Used  Substance and Sexual Activity  . Alcohol use: Yes    Comment: occasional   . Drug use: No  . Sexual activity: Not on file  Other Topics Concern  . Not on file  Social History Narrative   .      Social Determinants of Health   Financial Resource Strain:   . Difficulty of Paying Living Expenses: Not  on file  Food Insecurity:   . Worried About Charity fundraiser in the Last Year: Not on file  . Ran Out of Food in the Last Year: Not on file  Transportation Needs:   . Lack of Transportation (Medical): Not on file  . Lack of Transportation (Non-Medical): Not on file  Physical Activity:   . Days of Exercise per Week: Not on file  . Minutes of Exercise per Session: Not on file  Stress:   . Feeling of Stress : Not on file  Social Connections:   . Frequency of Communication with Friends and Family: Not on file  . Frequency of Social Gatherings with Friends and Family: Not on file  . Attends Religious Services: Not on file  .  Active Member of Clubs or Organizations: Not on file  . Attends Archivist Meetings: Not on file  . Marital Status: Not on file  Intimate Partner Violence:   . Fear of Current or Ex-Partner: Not on file  . Emotionally Abused: Not on file  . Physically Abused: Not on file  . Sexually Abused: Not on file    FAMILY HISTORY: Family History  Problem Relation Age of Onset  . Ovarian cancer Mother   . Mental illness Daughter   . Migraines Daughter   . Colon cancer Neg Hx   . Rectal cancer Neg Hx   . Throat cancer Neg Hx     ALLERGIES:  has No Known Allergies.  MEDICATIONS:  Current Outpatient Medications  Medication Sig Dispense Refill  . atorvastatin (LIPITOR) 10 MG tablet Take 1 tablet (10 mg total) by mouth daily. 30 tablet 12  . empagliflozin (JARDIANCE) 25 MG TABS tablet Take 25 mg by mouth daily before breakfast. 90 tablet 0  . Insulin Glargine (LANTUS SOLOSTAR) 100 UNIT/ML Solostar Pen INJECT 30 TO 60 UNITS SUBCUTANEOUSLY ONCE DAILY. START AT 30 UNITS THEN INCREASE BY 1 UNIT DAILY UNTIL FBS IS 100 15 mL 3  . Insulin Pen Needle (RELION PEN NEEDLES) 32G X 4 MM MISC USE TO INJECT MEDICATION ONCE DAILY Dx 10.9 100 each 3  . omeprazole (PRILOSEC) 40 MG capsule Take 1 capsule (40 mg total) by mouth daily. 30 capsule 11  . ondansetron (ZOFRAN ODT) 8 MG disintegrating tablet Take 1 tablet (8 mg total) by mouth every 8 (eight) hours as needed for nausea or vomiting. 20 tablet 0  . OZEMPIC, 1 MG/DOSE, 2 MG/1.5ML SOPN INJECT 1MG  INTO THE SKIN ONCE A WEEK 12 mL 0  . PARoxetine (PAXIL) 30 MG tablet Take 1 tablet (30 mg total) by mouth daily. 30 tablet 0  . SUMAtriptan (IMITREX) 100 MG tablet TAKE 1 TABLET BY MOUTH EVERY 2 HOURS AS NEEDED FOR  MIGRAINES 10 tablet 0  . telmisartan-hydrochlorothiazide (MICARDIS HCT) 40-12.5 MG tablet Take 1 tablet by mouth once daily 90 tablet 1   No current facility-administered medications for this visit.    REVIEW OF SYSTEMS:   Constitutional: (  - ) fevers, ( - )  chills , ( - ) night sweats Eyes: ( - ) blurriness of vision, ( - ) double vision, ( - ) watery eyes Ears, nose, mouth, throat, and face: ( - ) mucositis, ( - ) sore throat Respiratory: ( - ) cough, ( - ) dyspnea, ( - ) wheezes Cardiovascular: ( - ) palpitation, ( - ) chest discomfort, ( - ) lower extremity swelling Gastrointestinal:  ( - ) nausea, ( - ) heartburn, ( - ) change in  bowel habits Skin: ( - ) abnormal skin rashes Lymphatics: ( - ) new lymphadenopathy, ( - ) easy bruising Neurological: ( - ) numbness, ( - ) tingling, ( - ) new weaknesses Behavioral/Psych: ( - ) mood change, ( - ) new changes  All other systems were reviewed with the patient and are negative.  PHYSICAL EXAMINATION: ECOG PERFORMANCE STATUS: 0 - Asymptomatic  Vitals:   07/04/19 1353  BP: 103/67  Pulse: 95  Resp: 20  Temp: (!) 97.4 F (36.3 C)  SpO2: 98%   Filed Weights   07/04/19 1353  Weight: 191 lb 1.6 oz (86.7 kg)    GENERAL: well appearing middle aged Caucasian female in NAD  SKIN: skin color, texture, turgor are normal, no rashes or significant lesions EYES: conjunctiva are pink and non-injected, sclera clear LUNGS: clear to auscultation and percussion with normal breathing effort HEART: regular rate & rhythm and no murmurs and no lower extremity edema ABDOMEN: soft, non-tender, non-distended, normal bowel sounds. No HSM.  Musculoskeletal: no cyanosis of digits and no clubbing  PSYCH: alert & oriented x 3, fluent speech NEURO: no focal motor/sensory deficits  LABORATORY DATA:  I have reviewed the data as listed CBC Latest Ref Rng & Units 07/04/2019 04/25/2019 01/23/2019  WBC 4.0 - 10.5 K/uL 11.6(H) 11.4(H) 9.1  Hemoglobin 12.0 - 15.0 g/dL 13.7 14.8 13.6  Hematocrit 36.0 - 46.0 % 41.8 43.3 40.6  Platelets 150 - 400 K/uL 500(H) 548(H) 504(H)    CMP Latest Ref Rng & Units 07/04/2019 04/25/2019 10/07/2018  Glucose 70 - 99 mg/dL 102(H) 173(H) 144(H)  BUN 6 - 20 mg/dL 22(H) 17  20  Creatinine 0.44 - 1.00 mg/dL 0.78 0.83 0.86  Sodium 135 - 145 mmol/L 141 138 137  Potassium 3.5 - 5.1 mmol/L 3.9 4.5 4.3  Chloride 98 - 111 mmol/L 102 97 99  CO2 22 - 32 mmol/L 26 23 20   Calcium 8.9 - 10.3 mg/dL 9.6 10.2 9.8  Total Protein 6.5 - 8.1 g/dL 7.8 7.3 7.0  Total Bilirubin 0.3 - 1.2 mg/dL 0.7 0.8 0.5  Alkaline Phos 38 - 126 U/L 74 93 69  AST 15 - 41 U/L 17 15 15   ALT 0 - 44 U/L 19 18 17      PATHOLOGY: None to review.  BLOOD FILM: Review of the peripheral blood smear showed normal appearing white cells with neutrophils that were appropriately lobated and granulated. There was no predominance of bi-lobed or hyper-segmented neutrophils appreciated. No Dohle bodies were noted. There was no left shifting, immature forms or blasts noted. Lymphocytes remain normal in size without any predominance of large granular lymphocytes. Red cells show no anisopoikilocytosis, macrocytes , microcytes or polychromasia. There were no schistocytes, target cells, echinocytes, acanthocytes, dacrocytes, or stomatocytes.There was no rouleaux formation, nucleated red cells, or intra-cellular inclusions noted. The platelets are normal in size, shape, and color without any clumping evident. Platelets increased in number but otherwise normal blood film.   RADIOGRAPHIC STUDIES: None relevant to review.  No results found.  ASSESSMENT & PLAN SHERRYL AMBROSI 59 y.o. female with medical history significant for DM type II, HTN, and HLD who presents for evaluation of thrombocytosis.  After review the labs and discussion with the patient her findings are most consistent with a thrombocytosis of unclear etiology.  The differential diagnosis includes iron deficiency, hypothyroidism, increased inflammation, or myeloproliferative neoplasm.  Additionally this could potentially be a normal variant.  Today we will review iron studies and inflammatory markers to rule out  common etiologies.  Additionally we will do an MPN  work-up with JAK2 and BCR abl.  At this time her thrombocytosis is relatively modest, though I would recommend baby aspirin daily for thromboprophylaxis.  We will plan to see the patient back in approximately 6 months time to reevaluate, unless there is abnormalities on which we can intervene discovered in today's labs.  # Thrombocytosis, chronic --today will repeat CBC, CMP, and peripheral blood film --will order iron studies, TSH and inflammatory markers to r/o other possible etiologies -- MPN workup with JAK2 w/ reflex and BCR/ABL --RTC in 6 months or sooner pending the results of the above studies.   Orders Placed This Encounter  Procedures  . CBC with Differential (Cancer Center Only)    Standing Status:   Future    Number of Occurrences:   1    Standing Expiration Date:   07/03/2020  . CMP ([facility] only)    Standing Status:   Future    Number of Occurrences:   1    Standing Expiration Date:   07/03/2020  . Sedimentation rate    Standing Status:   Future    Number of Occurrences:   1    Standing Expiration Date:   07/03/2020  . Iron and TIBC    Standing Status:   Future    Number of Occurrences:   1    Standing Expiration Date:   07/03/2020  . Ferritin    Standing Status:   Future    Number of Occurrences:   1    Standing Expiration Date:   07/03/2020  . BCR ABL1 FISH ([facility])    Standing Status:   Future    Number of Occurrences:   1    Standing Expiration Date:   07/03/2020  . JAK2 (INCLUDING V617F AND EXON 12), MPL,& CALR W/RFL MPN PANEL (NGS)    Standing Status:   Future    Number of Occurrences:   1    Standing Expiration Date:   07/03/2020  . TSH    Standing Status:   Future    Number of Occurrences:   1    Standing Expiration Date:   07/03/2020  . Save Smear (SS[facility])    Standing Status:   Future    Number of Occurrences:   1    Standing Expiration Date:   07/03/2020  . C-reactive protein    Standing Status:   Future    Number of Occurrences:   1    Standing  Expiration Date:   07/03/2020    All questions were answered. The patient knows to call the clinic with any problems, questions or concerns.  A total of more than 45 minutes were spent on this encounter and over half of that time was spent on counseling and coordination of care as outlined above.   Ledell Peoples, MD Department of Hematology/Oncology [place] at [facility] Phone: [phone] Pager: [phone] Email: [email or web address]  07/09/2019 8:58 AM

## 2019-07-07 LAB — IRON AND TIBC
Iron: 59 ug/dL (ref 41–142)
Saturation Ratios: 14 % — ABNORMAL LOW (ref 21–57)
TIBC: 435 ug/dL (ref 236–444)
UIBC: 376 ug/dL (ref 120–384)

## 2019-07-07 LAB — TSH: TSH: 2.286 u[IU]/mL (ref 0.308–3.960)

## 2019-07-07 LAB — FERRITIN: Ferritin: 88 ng/mL (ref 11–307)

## 2019-07-09 ENCOUNTER — Encounter: Payer: Self-pay | Admitting: Hematology and Oncology

## 2019-07-16 ENCOUNTER — Other Ambulatory Visit: Payer: Self-pay | Admitting: Family Medicine

## 2019-07-16 DIAGNOSIS — F324 Major depressive disorder, single episode, in partial remission: Secondary | ICD-10-CM

## 2019-07-17 LAB — JAK2 (INCLUDING V617F AND EXON 12), MPL,& CALR W/RFL MPN PANEL (NGS)

## 2019-07-17 LAB — BCR ABL1 FISH (GENPATH)

## 2019-07-28 ENCOUNTER — Ambulatory Visit: Payer: BC Managed Care – PPO | Admitting: Family Medicine

## 2019-08-04 ENCOUNTER — Other Ambulatory Visit: Payer: Self-pay | Admitting: Family Medicine

## 2019-08-04 DIAGNOSIS — E109 Type 1 diabetes mellitus without complications: Secondary | ICD-10-CM

## 2019-08-05 ENCOUNTER — Other Ambulatory Visit: Payer: Self-pay

## 2019-08-06 ENCOUNTER — Ambulatory Visit: Payer: BC Managed Care – PPO | Admitting: Family Medicine

## 2019-08-06 ENCOUNTER — Encounter: Payer: Self-pay | Admitting: Family Medicine

## 2019-08-06 VITALS — BP 111/75 | HR 92 | Temp 99.2°F | Ht 66.0 in | Wt 194.0 lb

## 2019-08-06 DIAGNOSIS — E1169 Type 2 diabetes mellitus with other specified complication: Secondary | ICD-10-CM

## 2019-08-06 DIAGNOSIS — E1159 Type 2 diabetes mellitus with other circulatory complications: Secondary | ICD-10-CM | POA: Diagnosis not present

## 2019-08-06 DIAGNOSIS — I152 Hypertension secondary to endocrine disorders: Secondary | ICD-10-CM

## 2019-08-06 DIAGNOSIS — I10 Essential (primary) hypertension: Secondary | ICD-10-CM | POA: Diagnosis not present

## 2019-08-06 DIAGNOSIS — E785 Hyperlipidemia, unspecified: Secondary | ICD-10-CM

## 2019-08-06 DIAGNOSIS — E119 Type 2 diabetes mellitus without complications: Secondary | ICD-10-CM

## 2019-08-06 LAB — BAYER DCA HB A1C WAIVED: HB A1C (BAYER DCA - WAIVED): 7.8 % — ABNORMAL HIGH (ref <7.0)

## 2019-08-06 MED ORDER — LANTUS SOLOSTAR 100 UNIT/ML ~~LOC~~ SOPN: 64.0000 [IU] | PEN_INJECTOR | Freq: Every day | SUBCUTANEOUS | 12 refills | Status: DC

## 2019-08-06 NOTE — Patient Instructions (Signed)
Continue to increase Lantus by 1 unit if fasting blood sugar >130.

## 2019-08-06 NOTE — Progress Notes (Signed)
Subjective: CC: DM2, HLD PCP: [name], DO [id number] Carrie [name] is a 59 y.o. female presenting to clinic today for:  1. Type 2 Diabetes w/ HLD and hypertension:  Patient request to switch diabetic care here.  She really likes her current endocrinologist but the drive is quite far and the co-pays are becoming costly.  She reports compliance with 64 units of Lantus nightly, Ozempic 1 mg subcu every week, Jardiance 25 mg daily.  She also takes Lipitor 10 mg daily and Micardis HCT 40-12.5 mg.  Average morning blood sugars are still running above 200.  She is currently in the midst of titrating her Lantus up by 1 unit every couple of days for fasting blood sugars greater than 150.  She admits to not eating a structured diet.  Nor is she exercising.  She is planning on starting an exercise regimen with her husband soon.  Last eye exam: Needs.  Plans to establish care with my eye doctor Last foot exam: Up-to-date Last A1c:  Lab Results  Component Value Date   HGBA1C 7.7 (H) 04/25/2019   Nephropathy screen indicated?:  On ARB Last flu, zoster and/or pneumovax: Wishes to get her shingles vaccine but is anticipating upcoming COVID-19 vaccine and wants to wait until she is completed that course of vaccinations Immunization History  Administered Date(s) Administered  . Influenza,inj,Quad PF,6+ Mos 07/07/2016, 03/28/2017, 04/02/2018, 04/07/2019  . Influenza-Unspecified 04/17/2016  . Pneumococcal Polysaccharide-23 01/23/2019  . Tdap 01/23/2019    ROS: Denies dizziness, LOC, polyuria, polydipsia, unintended weight loss/gain, foot ulcerations, numbness or tingling in extremities, shortness of breath or chest pain.   ROS: Per HPI  No Known Allergies Past Medical History:  Diagnosis Date  . Depression   . Diabetes mellitus without complication ([place])   . Fever blister   . Gastroparesis   . Hyperlipidemia   . Hypertension   . Migraine     Current Outpatient Medications:  .   atorvastatin (LIPITOR) 10 MG tablet, Take 1 tablet (10 mg total) by mouth daily., Disp: 30 tablet, Rfl: 12 .  empagliflozin (JARDIANCE) 25 MG TABS tablet, Take 25 mg by mouth daily before breakfast., Disp: 90 tablet, Rfl: 0 .  Insulin Glargine (LANTUS SOLOSTAR) 100 UNIT/ML Solostar Pen, INJECT 30 TO 60 UNITS SUBCUTANEOUSLY ONCE DAILY. START AT 30 UNITS THEN INCREASE BY 1 UNIT DAILY UNTIL FBS IS 100, Disp: 15 mL, Rfl: 3 .  Insulin Pen Needle (RELION PEN NEEDLES) 32G X 4 MM MISC, USE TO INJECT MEDICATION ONCE DAILY Dx 10.9, Disp: 100 each, Rfl: 3 .  omeprazole (PRILOSEC) 40 MG capsule, Take 1 capsule (40 mg total) by mouth daily., Disp: 30 capsule, Rfl: 11 .  ondansetron (ZOFRAN ODT) 8 MG disintegrating tablet, Take 1 tablet (8 mg total) by mouth every 8 (eight) hours as needed for nausea or vomiting., Disp: 20 tablet, Rfl: 0 .  OZEMPIC, 1 MG/DOSE, 2 MG/1.5ML SOPN, INJECT 1 MG SUBCUTANEOUSLY ONCE A WEEK, Disp: 12 mL, Rfl: 0 .  PARoxetine (PAXIL) 30 MG tablet, Take 1 tablet by mouth once daily, Disp: 30 tablet, Rfl: 0 .  SUMAtriptan (IMITREX) 100 MG tablet, TAKE 1 TABLET BY MOUTH EVERY 2 HOURS AS NEEDED FOR  MIGRAINES, Disp: 10 tablet, Rfl: 0 .  telmisartan-hydrochlorothiazide (MICARDIS HCT) 40-12.5 MG tablet, Take 1 tablet by mouth once daily, Disp: 90 tablet, Rfl: 1 Social History   Socioeconomic History  . Marital status: Married    Spouse name: Not on file  . Number of children:  2  . Years of education: Not on file  . Highest education level: Not on file  Occupational History  . Occupation: customer service  Tobacco Use  . Smoking status: Former Smoker    Packs/day: 0.50    Years: 40.00    Pack years: 20.00    Types: Cigarettes    Quit date: 2006    Years since quitting: 15.1  . Smokeless tobacco: Never Used  Substance and Sexual Activity  . Alcohol use: Yes    Comment: occasional   . Drug use: No  . Sexual activity: Not on file  Other Topics Concern  . Not on file  Social  History Narrative   .      Social Determinants of Health   Financial Resource Strain:   . Difficulty of Paying Living Expenses: Not on file  Food Insecurity:   . Worried About Charity fundraiser in the Last Year: Not on file  . Ran Out of Food in the Last Year: Not on file  Transportation Needs:   . Lack of Transportation (Medical): Not on file  . Lack of Transportation (Non-Medical): Not on file  Physical Activity:   . Days of Exercise per Week: Not on file  . Minutes of Exercise per Session: Not on file  Stress:   . Feeling of Stress : Not on file  Social Connections:   . Frequency of Communication with Friends and Family: Not on file  . Frequency of Social Gatherings with Friends and Family: Not on file  . Attends Religious Services: Not on file  . Active Member of Clubs or Organizations: Not on file  . Attends Archivist Meetings: Not on file  . Marital Status: Not on file  Intimate Partner Violence:   . Fear of Current or Ex-Partner: Not on file  . Emotionally Abused: Not on file  . Physically Abused: Not on file  . Sexually Abused: Not on file   Family History  Problem Relation Age of Onset  . Ovarian cancer Mother   . Mental illness Daughter   . Migraines Daughter   . Colon cancer Neg Hx   . Rectal cancer Neg Hx   . Throat cancer Neg Hx     Objective: Office vital signs reviewed. BP 111/75   Pulse 92   Temp 99.2 F (37.3 C) (Temporal)   Ht 5\' 6"  (1.676 m)   Wt 194 lb (88 kg)   SpO2 99%   BMI 31.31 kg/m   Physical Examination:  General: Awake, alert, well appearing, No acute distress Cardio: regular rate and rhythm, S1S2 heard, no murmurs appreciated Pulm: clear to auscultation bilaterally, no wheezes, rhonchi or rales; normal work of breathing on room air Extremities: warm, well perfused, No edema, cyanosis or clubbing; +2 pulses bilaterally  Assessment/ Plan: 59 y.o. female   1. Type 2 diabetes mellitus without complication, without  long-term current use of insulin (HCC) A1c remains uncontrolled with a slight rise since her check in November.  Today was 7.8.  I have encouraged her to continue increasing the Lantus by 1 unit every couple of days for fasting blood sugars greater than 150.  Goal is to get her fasting blood sugars under 120.  Continue Ozempic, Jardiance.  She is to schedule her diabetic eye exam and have results sent to our office.  We will follow-up in 3 months, sooner if needed - Bayer DCA Hb A1c Waived - Insulin Glargine (LANTUS SOLOSTAR) 100 UNIT/ML Solostar Pen; Inject  64-80 Units into the skin at bedtime.  Dispense: 30 mL; Refill: 12  2. Hypertension associated with diabetes ([place]) Controlled.  Continue current dose of Micardis HCT  3. Hyperlipidemia associated with type 2 diabetes mellitus ([place]) Continue statin  4. Morbid obesity ([place]) Working on weight loss.  She is starting a new diet regimen and workout plan.   Orders Placed This Encounter  Procedures  . Bayer DCA Hb A1c Waived   No orders of the defined types were placed in this encounter.    [name], DO [place] [phone]

## 2019-08-11 DIAGNOSIS — Z23 Encounter for immunization: Secondary | ICD-10-CM | POA: Diagnosis not present

## 2019-08-12 ENCOUNTER — Telehealth: Payer: Self-pay | Admitting: *Deleted

## 2019-08-12 NOTE — Telephone Encounter (Signed)
Patient called questioning lab results from January 29th.  She states she hasn't gotten any results back as of yet and wants to know the results.  Routed to MD to review and advise.  Patients next visit is 6 months out.

## 2019-08-13 ENCOUNTER — Other Ambulatory Visit: Payer: Self-pay | Admitting: Family Medicine

## 2019-08-13 DIAGNOSIS — E119 Type 2 diabetes mellitus without complications: Secondary | ICD-10-CM

## 2019-08-19 ENCOUNTER — Telehealth: Payer: Self-pay

## 2019-08-19 NOTE — Telephone Encounter (Signed)
Received call from patient requesting to know the results from Labs done on January 29. Request routed to Dr. Lorenso Courier.

## 2019-08-22 ENCOUNTER — Other Ambulatory Visit: Payer: Self-pay | Admitting: Physician Assistant

## 2019-08-22 ENCOUNTER — Other Ambulatory Visit: Payer: Self-pay | Admitting: Family Medicine

## 2019-08-22 DIAGNOSIS — G43809 Other migraine, not intractable, without status migrainosus: Secondary | ICD-10-CM

## 2019-08-22 DIAGNOSIS — F324 Major depressive disorder, single episode, in partial remission: Secondary | ICD-10-CM

## 2019-08-25 ENCOUNTER — Encounter: Payer: Self-pay | Admitting: Hematology and Oncology

## 2019-08-26 ENCOUNTER — Telehealth: Payer: Self-pay | Admitting: *Deleted

## 2019-08-26 NOTE — Telephone Encounter (Signed)
Patient called again today to ask about her lab results from January 29th.  She states this is her 5th or 6th attempt to reach our office to get results which has been noted on several occassions.    Routed HIGH PRIORITY to Dr. Lorenso Courier to give results or clearance to review results directly with the patient.

## 2019-08-27 ENCOUNTER — Telehealth: Payer: Self-pay | Admitting: Hematology and Oncology

## 2019-08-27 NOTE — Telephone Encounter (Signed)
Called Mrs. Guneet Detorres to discuss the results of her bloodwork. Findings show no evidence of a JAK2 or BCR/ABL mutation. No clear etiology for her leukocytosis/thrombocytosis. We will plan to see her back in July/August 2021 to recheck her labs and assess for new symptoms. Mrs. Oakely voiced her understanding of these findings.   Ledell Peoples, MD Department of Hematology/Oncology [place] at [facility] Phone: [phone] Pager: [phone] Email: [email or web address]

## 2019-08-29 NOTE — Telephone Encounter (Signed)
Entered in error. Please see full phone note from earlier this week.

## 2019-09-09 DIAGNOSIS — Z23 Encounter for immunization: Secondary | ICD-10-CM | POA: Diagnosis not present

## 2019-10-16 ENCOUNTER — Other Ambulatory Visit: Payer: Self-pay | Admitting: Family Medicine

## 2019-10-16 DIAGNOSIS — G43809 Other migraine, not intractable, without status migrainosus: Secondary | ICD-10-CM

## 2019-11-07 ENCOUNTER — Other Ambulatory Visit: Payer: Self-pay | Admitting: Family Medicine

## 2019-11-07 DIAGNOSIS — E109 Type 1 diabetes mellitus without complications: Secondary | ICD-10-CM

## 2019-11-14 ENCOUNTER — Other Ambulatory Visit: Payer: Self-pay | Admitting: Family Medicine

## 2019-11-14 DIAGNOSIS — E119 Type 2 diabetes mellitus without complications: Secondary | ICD-10-CM

## 2019-11-17 ENCOUNTER — Other Ambulatory Visit: Payer: Self-pay

## 2019-11-17 ENCOUNTER — Encounter: Payer: Self-pay | Admitting: Family Medicine

## 2019-11-17 ENCOUNTER — Ambulatory Visit: Payer: BC Managed Care – PPO | Admitting: Family Medicine

## 2019-11-17 VITALS — BP 99/62 | HR 78 | Temp 97.7°F | Ht 66.0 in | Wt 187.0 lb

## 2019-11-17 DIAGNOSIS — E1159 Type 2 diabetes mellitus with other circulatory complications: Secondary | ICD-10-CM

## 2019-11-17 DIAGNOSIS — E109 Type 1 diabetes mellitus without complications: Secondary | ICD-10-CM

## 2019-11-17 DIAGNOSIS — E785 Hyperlipidemia, unspecified: Secondary | ICD-10-CM

## 2019-11-17 DIAGNOSIS — D72829 Elevated white blood cell count, unspecified: Secondary | ICD-10-CM

## 2019-11-17 DIAGNOSIS — E1169 Type 2 diabetes mellitus with other specified complication: Secondary | ICD-10-CM | POA: Diagnosis not present

## 2019-11-17 DIAGNOSIS — I10 Essential (primary) hypertension: Secondary | ICD-10-CM | POA: Diagnosis not present

## 2019-11-17 DIAGNOSIS — F324 Major depressive disorder, single episode, in partial remission: Secondary | ICD-10-CM

## 2019-11-17 DIAGNOSIS — I152 Hypertension secondary to endocrine disorders: Secondary | ICD-10-CM

## 2019-11-17 DIAGNOSIS — E119 Type 2 diabetes mellitus without complications: Secondary | ICD-10-CM

## 2019-11-17 LAB — BAYER DCA HB A1C WAIVED: HB A1C (BAYER DCA - WAIVED): 6.3 % (ref <7.0)

## 2019-11-17 MED ORDER — TELMISARTAN-HCTZ 40-12.5 MG PO TABS: 0.5000 | ORAL_TABLET | Freq: Every day | ORAL | 1 refills | Status: DC

## 2019-11-17 MED ORDER — EMPAGLIFLOZIN 25 MG PO TABS: ORAL_TABLET | ORAL | 3 refills | Status: DC

## 2019-11-17 MED ORDER — OZEMPIC (1 MG/DOSE) 2 MG/1.5ML ~~LOC~~ SOPN: PEN_INJECTOR | SUBCUTANEOUS | 3 refills | Status: DC

## 2019-11-17 MED ORDER — ATORVASTATIN CALCIUM 10 MG PO TABS: 10.0000 mg | ORAL_TABLET | Freq: Every day | ORAL | 3 refills | Status: DC

## 2019-11-17 MED ORDER — PAROXETINE HCL 30 MG PO TABS: 30.0000 mg | ORAL_TABLET | Freq: Every day | ORAL | 2 refills | Status: DC

## 2019-11-17 NOTE — Patient Instructions (Signed)
You had labs performed today.  You will be contacted with the results of the labs once they are available, usually in the next 3 business days for routine lab work.  If you have an active my chart account, they will be released to your MyChart.  If you prefer to have these labs released to you via telephone, please let us know.  If you had a pap smear or biopsy performed, expect to be contacted in about 7-10 days.  You are doing SOOO GOOD!  Keep up the good work.  Cut the blood pressure pill in half.  If you continue to have dizzy spells or feel especially tired, let me know.  Your BP may still be too low and we might consider stopping your medication.

## 2019-11-17 NOTE — Progress Notes (Signed)
Subjective: CC: DM2, HLD PCP: [name], DO Carrie [name] is a 59 y.o. female presenting to clinic today for:  1. Type 2 Diabetes w/ HLD and hypertension:  She reports compliance with 70 units (from 64 units last visit) of Lantus nightly, Ozempic 1 mg subcu every week, Jardiance 25 mg daily.  She also takes Lipitor 10 mg daily and Micardis HCT 40-12.5 mg.  Average morning blood sugars are still running 114-140s.  She is really trying to cut back on her eating.  She typically is drinking a protein shake each morning, a protein bar for snack, a small salad for lunch and has a normal dinner (notes that she is not restricting during dinner) daily.  She did walk for about a week but then it became too hot and she discontinued.  She has had an episode of lightheadedness.  She does not check her blood pressures at home.  She does report that prior to initiation of the hydrochlorothiazide she did have problems with swelling of her feet.  Last eye exam: Needs.  Plans to establish care with my eye doctor Last foot exam: Up-to-date Last A1c:  Lab Results  Component Value Date   HGBA1C 7.8 (H) 08/06/2019   Nephropathy screen indicated?:  On ARB Last flu, zoster and/or pneumovax: Shingles Immunization History  Administered Date(s) Administered  . Influenza,inj,Quad PF,6+ Mos 07/07/2016, 03/28/2017, 04/02/2018, 04/07/2019  . Influenza-Unspecified 04/17/2016  . Pneumococcal Polysaccharide-23 01/23/2019  . Tdap 01/23/2019    ROS: Denies dizziness, LOC, polyuria, polydipsia, unintended weight loss/gain, foot ulcerations, numbness or tingling in extremities, shortness of breath or chest pain.   ROS: Per HPI  No Known Allergies Past Medical History:  Diagnosis Date  . Depression   . Diabetes mellitus without complication ([place])   . Fever blister   . Gastroparesis   . Hyperlipidemia   . Hypertension   . Migraine     Current Outpatient Medications:  .  atorvastatin (LIPITOR)  10 MG tablet, Take 1 tablet (10 mg total) by mouth daily., Disp: 30 tablet, Rfl: 12 .  Insulin Glargine (LANTUS SOLOSTAR) 100 UNIT/ML Solostar Pen, Inject 64-80 Units into the skin at bedtime., Disp: 30 mL, Rfl: 12 .  Insulin Pen Needle (RELION PEN NEEDLES) 32G X 4 MM MISC, USE TO INJECT MEDICATION ONCE DAILY Dx 10.9, Disp: 100 each, Rfl: 3 .  JARDIANCE 25 MG TABS tablet, TAKE 1 TABLET BY MOUTH BEFORE BREAKFAST, Disp: 90 tablet, Rfl: 0 .  omeprazole (PRILOSEC) 40 MG capsule, Take 1 capsule (40 mg total) by mouth daily., Disp: 30 capsule, Rfl: 11 .  ondansetron (ZOFRAN ODT) 8 MG disintegrating tablet, Take 1 tablet (8 mg total) by mouth every 8 (eight) hours as needed for nausea or vomiting., Disp: 20 tablet, Rfl: 0 .  OZEMPIC, 1 MG/DOSE, 2 MG/1.5ML SOPN, INJECT 1MG  SUBCUTANEOUSLY ONCE A WEEK, Disp: 12 mL, Rfl: 0 .  PARoxetine (PAXIL) 30 MG tablet, Take 1 tablet by mouth once daily, Disp: 30 tablet, Rfl: 2 .  SUMAtriptan (IMITREX) 100 MG tablet, TAKE 1 TABLET BY MOUTH EVERY 2 HOURS AS NEEDED FOR MIGRAINE HEADACHE, Disp: 10 tablet, Rfl: 0 .  telmisartan-hydrochlorothiazide (MICARDIS HCT) 40-12.5 MG tablet, Take 1 tablet by mouth once daily, Disp: 90 tablet, Rfl: 1 Social History   Socioeconomic History  . Marital status: Married    Spouse name: Not on file  . Number of children: 2  . Years of education: Not on file  . Highest education level: Not on  file  Occupational History  . Occupation: customer service  Tobacco Use  . Smoking status: Former Smoker    Packs/day: 0.50    Years: 40.00    Pack years: 20.00    Types: Cigarettes    Quit date: 2006    Years since quitting: 15.4  . Smokeless tobacco: Never Used  Vaping Use  . Vaping Use: Never used  Substance and Sexual Activity  . Alcohol use: Yes    Comment: occasional   . Drug use: No  . Sexual activity: Not on file  Other Topics Concern  . Not on file  Social History Narrative   .      Social Determinants of Health    Financial Resource Strain:   . Difficulty of Paying Living Expenses:   Food Insecurity:   . Worried About Charity fundraiser in the Last Year:   . Arboriculturist in the Last Year:   Transportation Needs:   . Film/video editor (Medical):   Marland Kitchen Lack of Transportation (Non-Medical):   Physical Activity:   . Days of Exercise per Week:   . Minutes of Exercise per Session:   Stress:   . Feeling of Stress :   Social Connections:   . Frequency of Communication with Friends and Family:   . Frequency of Social Gatherings with Friends and Family:   . Attends Religious Services:   . Active Member of Clubs or Organizations:   . Attends Archivist Meetings:   Marland Kitchen Marital Status:   Intimate Partner Violence:   . Fear of Current or Ex-Partner:   . Emotionally Abused:   Marland Kitchen Physically Abused:   . Sexually Abused:    Family History  Problem Relation Age of Onset  . Ovarian cancer Mother   . Mental illness Daughter   . Migraines Daughter   . Colon cancer Neg Hx   . Rectal cancer Neg Hx   . Throat cancer Neg Hx     Objective: Office vital signs reviewed. BP 99/62   Pulse 78   Temp 97.7 F (36.5 C) (Temporal)   Ht 5\' 6"  (1.676 m)   Wt 187 lb (84.8 kg)   SpO2 97%   BMI 30.18 kg/m   Physical Examination:  General: Awake, alert, well appearing, No acute distress Cardio: regular rate and rhythm, S1S2 heard, no murmurs appreciated Pulm: clear to auscultation bilaterally, no wheezes, rhonchi or rales; normal work of breathing on room air Extremities: warm, well perfused, No edema, cyanosis or clubbing; +2 pulses bilaterally  Assessment/ Plan: 59 y.o. female   1. Type 2 diabetes mellitus with other specified complication, without long-term current use of insulin (HCC) Under control with A1c of 6.3 today.  She is done an excellent job with diet modification.  We discussed that if her blood sugars remain in the low 100s to below 100, we can maybe start backing down on the  Lantus.  I think this will be true as she continues to lose weight and increase her exercise.  She will schedule a diabetic eye exam - Bayer DCA Hb A1c Waived - Basic Metabolic Panel  2. Hypertension associated with diabetes ([place]) On the hypotensive side.  I have recommended that she drop her Micardis down to half tablet daily and monitor for signs and symptoms of hypotension.  If blood pressure remains on the lower side, may need to consider reduction of the ARB and using the hydrochlorothiazide only as needed. - Basic Metabolic Panel -  telmisartan-hydrochlorothiazide (MICARDIS HCT) 40-12.5 MG tablet; Take 0.5 tablets by mouth daily.  Dispense: 45 tablet; Refill: 1  3. Hyperlipidemia associated with type 2 diabetes mellitus (HCC) Continue Lipitor - atorvastatin (LIPITOR) 10 MG tablet; Take 1 tablet (10 mg total) by mouth daily.  Dispense: 90 tablet; Refill: 3  4. Morbid obesity ([place]) Doing excellent with weight loss  5. Leukocytosis, unspecified type - CBC with Differential/Platelet  6. Depression, major, single episode, in partial remission (HCC) - PARoxetine (PAXIL) 30 MG tablet; Take 1 tablet (30 mg total) by mouth daily.  Dispense: 90 tablet; Refill: 2   No orders of the defined types were placed in this encounter.  Meds ordered this encounter  Medications  . telmisartan-hydrochlorothiazide (MICARDIS HCT) 40-12.5 MG tablet    Sig: Take 0.5 tablets by mouth daily.    Dispense:  45 tablet    Refill:  1  . empagliflozin (JARDIANCE) 25 MG TABS tablet    Sig: TAKE 1 TABLET BY MOUTH BEFORE BREAKFAST    Dispense:  90 tablet    Refill:  3  . Semaglutide, 1 MG/DOSE, (OZEMPIC, 1 MG/DOSE,) 2 MG/1.5ML SOPN    Sig: INJECT 1MG  SUBCUTANEOUSLY ONCE A WEEK    Dispense:  12 mL    Refill:  3  . PARoxetine (PAXIL) 30 MG tablet    Sig: Take 1 tablet (30 mg total) by mouth daily.    Dispense:  90 tablet    Refill:  2  . atorvastatin (LIPITOR) 10 MG tablet    Sig: Take 1 tablet (10 mg  total) by mouth daily.    Dispense:  90 tablet    Refill:  [place], [place] [phone]

## 2019-11-18 LAB — BASIC METABOLIC PANEL
BUN/Creatinine Ratio: 23 (ref 9–23)
BUN: 18 mg/dL (ref 6–24)
CO2: 21 mmol/L (ref 20–29)
Calcium: 9.3 mg/dL (ref 8.7–10.2)
Chloride: 101 mmol/L (ref 96–106)
Creatinine, Ser: 0.79 mg/dL (ref 0.57–1.00)
GFR calc Af Amer: 95 mL/min/{1.73_m2} (ref >59)
GFR calc non Af Amer: 82 mL/min/{1.73_m2} (ref >59)
Glucose: 102 mg/dL — ABNORMAL HIGH (ref 65–99)
Potassium: 4.4 mmol/L (ref 3.5–5.2)
Sodium: 139 mmol/L (ref 134–144)

## 2019-11-18 LAB — CBC WITH DIFFERENTIAL/PLATELET
Basophils Absolute: 0.1 10*3/uL (ref 0.0–0.2)
Basos: 1 %
EOS (ABSOLUTE): 0.2 10*3/uL (ref 0.0–0.4)
Eos: 3 %
Hematocrit: 43.3 % (ref 34.0–46.6)
Hemoglobin: 14.3 g/dL (ref 11.1–15.9)
Immature Grans (Abs): 0 10*3/uL (ref 0.0–0.1)
Immature Granulocytes: 0 %
Lymphocytes Absolute: 2.4 10*3/uL (ref 0.7–3.1)
Lymphs: 30 %
MCH: 30.5 pg (ref 26.6–33.0)
MCHC: 33 g/dL (ref 31.5–35.7)
MCV: 92 fL (ref 79–97)
Monocytes Absolute: 0.9 10*3/uL (ref 0.1–0.9)
Monocytes: 10 %
Neutrophils Absolute: 4.6 10*3/uL (ref 1.4–7.0)
Neutrophils: 56 %
Platelets: 523 10*3/uL — ABNORMAL HIGH (ref 150–450)
RBC: 4.69 x10E6/uL (ref 3.77–5.28)
RDW: 13.6 % (ref 11.7–15.4)
WBC: 8.2 10*3/uL (ref 3.4–10.8)

## 2019-12-02 ENCOUNTER — Other Ambulatory Visit: Payer: Self-pay | Admitting: Family Medicine

## 2019-12-02 DIAGNOSIS — F324 Major depressive disorder, single episode, in partial remission: Secondary | ICD-10-CM

## 2019-12-02 NOTE — Telephone Encounter (Signed)
paxil denied as refills were sent in at her visit 11/17/19

## 2019-12-22 ENCOUNTER — Encounter: Payer: Self-pay | Admitting: Family Medicine

## 2019-12-22 DIAGNOSIS — E1159 Type 2 diabetes mellitus with other circulatory complications: Secondary | ICD-10-CM

## 2019-12-22 DIAGNOSIS — I152 Hypertension secondary to endocrine disorders: Secondary | ICD-10-CM

## 2019-12-22 MED ORDER — TELMISARTAN-HCTZ 40-12.5 MG PO TABS: 0.5000 | ORAL_TABLET | Freq: Every day | ORAL | 1 refills | Status: DC

## 2020-01-22 DIAGNOSIS — H5203 Hypermetropia, bilateral: Secondary | ICD-10-CM | POA: Diagnosis not present

## 2020-01-22 DIAGNOSIS — H52223 Regular astigmatism, bilateral: Secondary | ICD-10-CM | POA: Diagnosis not present

## 2020-01-22 DIAGNOSIS — H524 Presbyopia: Secondary | ICD-10-CM | POA: Diagnosis not present

## 2020-01-22 DIAGNOSIS — H2513 Age-related nuclear cataract, bilateral: Secondary | ICD-10-CM | POA: Diagnosis not present

## 2020-01-22 LAB — HM DIABETES EYE EXAM

## 2020-02-04 ENCOUNTER — Encounter: Payer: Self-pay | Admitting: *Deleted

## 2020-02-05 DIAGNOSIS — H52223 Regular astigmatism, bilateral: Secondary | ICD-10-CM | POA: Diagnosis not present

## 2020-02-05 DIAGNOSIS — H524 Presbyopia: Secondary | ICD-10-CM | POA: Diagnosis not present

## 2020-02-12 ENCOUNTER — Encounter: Payer: Self-pay | Admitting: Family

## 2020-02-12 ENCOUNTER — Ambulatory Visit (INDEPENDENT_AMBULATORY_CARE_PROVIDER_SITE_OTHER): Payer: BC Managed Care – PPO | Admitting: Family

## 2020-02-12 DIAGNOSIS — G8929 Other chronic pain: Secondary | ICD-10-CM | POA: Diagnosis not present

## 2020-02-12 DIAGNOSIS — M5441 Lumbago with sciatica, right side: Secondary | ICD-10-CM | POA: Diagnosis not present

## 2020-02-12 MED ORDER — DICLOFENAC SODIUM 75 MG PO TBEC: 75.0000 mg | DELAYED_RELEASE_TABLET | Freq: Two times a day (BID) | ORAL | 0 refills | Status: DC

## 2020-02-12 MED ORDER — PREDNISONE 10 MG (21) PO TBPK: ORAL_TABLET | ORAL | 0 refills | Status: DC

## 2020-02-12 MED ORDER — BACLOFEN 10 MG PO TABS: 10.0000 mg | ORAL_TABLET | Freq: Three times a day (TID) | ORAL | 0 refills | Status: DC

## 2020-02-12 NOTE — Progress Notes (Signed)
   Virtual Visit via telephone Note Due to COVID-19 pandemic this visit was conducted virtually. This visit type was conducted due to national recommendations for restrictions regarding the COVID-19 Pandemic (e.g. social distancing, sheltering in place) in an effort to limit this patient's exposure and mitigate transmission in our community. All issues noted in this document were discussed and addressed.  A physical exam was not performed with this format.  I connected with Carrie [name] on 02/12/20 at 12:30 pm  by telephone and verified that I am speaking with the correct person using two identifiers. Carrie [name] is currently located at work and no one is currently with her during visit. The provider, Evelina Dun, FNP is located in their office at time of visit.  I discussed the limitations, risks, security and privacy concerns of performing an evaluation and management service by telephone and the availability of in person appointments. I also discussed with the patient that there may be a patient responsible charge related to this service. The patient expressed understanding and agreed to proceed.   History and Present Illness:  Back Pain This is a recurrent problem. The current episode started more than 1 year ago. The problem occurs intermittently. The problem has been waxing and waning since onset. The pain is present in the lumbar spine. The quality of the pain is described as aching. The pain radiates to the right thigh and right foot. The pain is at a severity of 10/10. The pain is moderate. The symptoms are aggravated by standing and twisting. Associated symptoms include numbness and weakness. Pertinent negatives include no bladder incontinence or bowel incontinence. She has tried NSAIDs for the symptoms. The treatment provided mild relief.      Review of Systems  Gastrointestinal: Negative for bowel incontinence.  Genitourinary: Negative for bladder incontinence.  Musculoskeletal:  Positive for back pain.  Neurological: Positive for weakness and numbness.  All other systems reviewed and are negative.    Observations/Objective: No SOB or distress noted   Assessment and Plan: 1. Chronic right-sided low back pain with right-sided sciatica Rest ROM exercises encouraged Sedation precautions discussed Strict low carb diet  No other NSAID's while taking diclofenac  - predniSONE (STERAPRED UNI-PAK 21 TAB) 10 MG (21) TBPK tablet; Use as directed  Dispense: 21 tablet; Refill: 0 - baclofen (LIORESAL) 10 MG tablet; Take 1 tablet (10 mg total) by mouth 3 (three) times daily.  Dispense: 30 each; Refill: 0 - diclofenac (VOLTAREN) 75 MG EC tablet; Take 1 tablet (75 mg total) by mouth 2 (two) times daily.  Dispense: 30 tablet; Refill: 0   I discussed the assessment and treatment plan with the patient. The patient was provided an opportunity to ask questions and all were answered. The patient agreed with the plan and demonstrated an understanding of the instructions.   The patient was advised to call back or seek an in-person evaluation if the symptoms worsen or if the condition fails to improve as anticipated.  The above assessment and management plan was discussed with the patient. The patient verbalized understanding of and has agreed to the management plan. Patient is aware to call the clinic if symptoms persist or worsen. Patient is aware when to return to the clinic for a follow-up visit. Patient educated on when it is appropriate to go to the emergency department.   Time call ended:  12:38 pm  I provided 8 minutes of non-face-to-face time during this encounter.    Evelina Dun, FNP

## 2020-02-13 ENCOUNTER — Ambulatory Visit: Payer: BC Managed Care – PPO | Admitting: Nurse Practitioner

## 2020-02-17 ENCOUNTER — Other Ambulatory Visit: Payer: Self-pay

## 2020-02-17 ENCOUNTER — Emergency Department (HOSPITAL_COMMUNITY)
Admission: EM | Admit: 2020-02-17 | Discharge: 2020-02-17 | Disposition: A | Discharge status: Home / Self Care | Payer: BC Managed Care – PPO | Attending: Emergency Medicine | Admitting: Emergency Medicine

## 2020-02-17 ENCOUNTER — Telehealth: Payer: Self-pay | Admitting: Family Medicine

## 2020-02-17 ENCOUNTER — Encounter (HOSPITAL_COMMUNITY): Payer: Self-pay | Admitting: *Deleted

## 2020-02-17 DIAGNOSIS — M545 Low back pain: Secondary | ICD-10-CM | POA: Diagnosis not present

## 2020-02-17 DIAGNOSIS — Z87891 Personal history of nicotine dependence: Secondary | ICD-10-CM | POA: Diagnosis not present

## 2020-02-17 DIAGNOSIS — R202 Paresthesia of skin: Secondary | ICD-10-CM | POA: Insufficient documentation

## 2020-02-17 DIAGNOSIS — E119 Type 2 diabetes mellitus without complications: Secondary | ICD-10-CM | POA: Diagnosis not present

## 2020-02-17 DIAGNOSIS — R42 Dizziness and giddiness: Secondary | ICD-10-CM | POA: Diagnosis not present

## 2020-02-17 DIAGNOSIS — I10 Essential (primary) hypertension: Secondary | ICD-10-CM | POA: Insufficient documentation

## 2020-02-17 DIAGNOSIS — K589 Irritable bowel syndrome without diarrhea: Secondary | ICD-10-CM | POA: Insufficient documentation

## 2020-02-17 DIAGNOSIS — F324 Major depressive disorder, single episode, in partial remission: Secondary | ICD-10-CM | POA: Diagnosis not present

## 2020-02-17 DIAGNOSIS — Z794 Long term (current) use of insulin: Secondary | ICD-10-CM | POA: Insufficient documentation

## 2020-02-17 DIAGNOSIS — H9313 Tinnitus, bilateral: Secondary | ICD-10-CM | POA: Diagnosis not present

## 2020-02-17 DIAGNOSIS — G43909 Migraine, unspecified, not intractable, without status migrainosus: Secondary | ICD-10-CM | POA: Diagnosis not present

## 2020-02-17 DIAGNOSIS — M5416 Radiculopathy, lumbar region: Secondary | ICD-10-CM | POA: Insufficient documentation

## 2020-02-17 MED ORDER — LIDOCAINE 5 % EX PTCH
1.0000 | MEDICATED_PATCH | Freq: Once | CUTANEOUS | Status: DC
  Administered 2020-02-17: 1 via TRANSDERMAL
  Filled 2020-02-17: qty 1

## 2020-02-17 MED ORDER — KETOROLAC TROMETHAMINE 30 MG/ML IJ SOLN
30.0000 mg | Freq: Once | INTRAMUSCULAR | Status: AC
  Administered 2020-02-17: 30 mg via INTRAMUSCULAR
  Filled 2020-02-17: qty 1

## 2020-02-17 MED ORDER — METHOCARBAMOL 500 MG PO TABS: 500.0000 mg | ORAL_TABLET | Freq: Two times a day (BID) | ORAL | 0 refills | Status: DC

## 2020-02-17 MED ORDER — HYDROCODONE-ACETAMINOPHEN 5-325 MG PO TABS: 1.0000 | ORAL_TABLET | Freq: Four times a day (QID) | ORAL | 0 refills | Status: DC | PRN

## 2020-02-17 MED ORDER — HYDROCODONE-ACETAMINOPHEN 5-325 MG PO TABS
1.0000 | ORAL_TABLET | Freq: Four times a day (QID) | ORAL | 0 refills | Status: DC | PRN
Start: 2020-02-17 — End: 2020-03-02

## 2020-02-17 NOTE — Telephone Encounter (Signed)
Patient states that she is currently at [facility] and will call back tomorrow to make an appt if does not get seen in ER

## 2020-02-17 NOTE — ED Provider Notes (Signed)
[facility] Provider Note   CSN: [id number] Arrival date & time: 02/17/20  1600     History Chief Complaint  Patient presents with  . Back Pain    Carrie [name] is a 59 y.o. female.  Carrie [name] is a 59 y.o. female with a history of hypertension, hyperlipidemia, diabetes, gastroparesis, migraine, previous herniated disks requiring surgical intervention, who presents to the emergency department for evaluation of back pain that has been ongoing for 1 week.  She states pain is present in the low back and radiates through the right buttock and down the right leg.  She states pain is constant, worse with movement.  She states this feels very similar to when she had a herniated disc about 8 years ago, that time symptoms were present on the left.  She had surgery with Dr. Hal Neer with neurosurgery and has had significant improvement since then.  She states since pain began she has had some paresthesias in her left big toe, no weakness and she has been able to ambulate.  She had a video visit with her PCP regarding this, and has been started on diclofenac, baclofen and just completed a course of prednisone without much relief, she states she did not want to come to the emergency department but was dealing with increasing pain impacting her ability to get around and did not know what else to do.  No difficulty with loss of bowel or bladder control, no saddle anesthesia.  No associated urinary symptoms, abdominal pain, fevers.  No history of cancer or IV drug use.        Past Medical History:  Diagnosis Date  . Depression   . Diabetes mellitus without complication ([place])   . Fever blister   . Gastroparesis   . Hyperlipidemia   . Hypertension   . Migraine     Patient Active Problem List   Diagnosis Date Noted  . Hyperlipidemia associated with type 2 diabetes mellitus ([place]) 01/23/2019  . Morbid obesity ([place]) 11/13/2017  . Type 1 diabetes mellitus without complications  ([place]) 42/59/5638  . Hypertension associated with diabetes ([place]) 02/27/2017  . Tinnitus of both ears 02/26/2017  . Dizziness 02/26/2017  . Depression, major, single episode, in partial remission ([place]) 07/07/2016  . Migraine 07/07/2016  . Special screening for malignant neoplasms, colon 04/03/2016  . Irritable bowel syndrome (IBS) 04/03/2016    Past Surgical History:  Procedure Laterality Date  . BACK SURGERY    . CHOLECYSTECTOMY  2004     OB History   No obstetric history on file.     Family History  Problem Relation Age of Onset  . Ovarian cancer Mother   . Mental illness Daughter   . Migraines Daughter   . Colon cancer Neg Hx   . Rectal cancer Neg Hx   . Throat cancer Neg Hx     Social History   Tobacco Use  . Smoking status: Former Smoker    Packs/day: 0.50    Years: 40.00    Pack years: 20.00    Types: Cigarettes    Quit date: 2006    Years since quitting: 15.7  . Smokeless tobacco: Never Used  Vaping Use  . Vaping Use: Never used  Substance Use Topics  . Alcohol use: Yes    Comment: occasional   . Drug use: No    Home Medications Prior to Admission medications   Medication Sig Start Date End Date Taking? Authorizing Provider  atorvastatin (LIPITOR) 10  MG tablet Take 1 tablet (10 mg total) by mouth daily. 11/17/19   [name], DO  baclofen (LIORESAL) 10 MG tablet Take 1 tablet (10 mg total) by mouth 3 (three) times daily. 02/12/20   Sharion Balloon, FNP  diclofenac (VOLTAREN) 75 MG EC tablet Take 1 tablet (75 mg total) by mouth 2 (two) times daily. 02/12/20   Sharion Balloon, FNP  empagliflozin (JARDIANCE) 25 MG TABS tablet TAKE 1 TABLET BY MOUTH BEFORE BREAKFAST 11/17/19   Ronnie Doss M, DO  Insulin Glargine (LANTUS SOLOSTAR) 100 UNIT/ML Solostar Pen Inject 64-80 Units into the skin at bedtime. 08/06/19   [name], DO  Insulin Pen Needle (RELION PEN NEEDLES) 32G X 4 MM MISC USE TO INJECT MEDICATION ONCE DAILY Dx 10.9 08/20/18   Terald Sleeper, PA-C  omeprazole (PRILOSEC) 40 MG capsule Take 1 capsule (40 mg total) by mouth daily. 11/13/17   Terald Sleeper, PA-C  ondansetron (ZOFRAN ODT) 8 MG disintegrating tablet Take 1 tablet (8 mg total) by mouth every 8 (eight) hours as needed for nausea or vomiting. 08/07/18   Terald Sleeper, PA-C  PARoxetine (PAXIL) 30 MG tablet Take 1 tablet (30 mg total) by mouth daily. 11/17/19   [name], DO  predniSONE (STERAPRED UNI-PAK 21 TAB) 10 MG (21) TBPK tablet Use as directed 02/12/20   Evelina Dun A, FNP  Semaglutide, 1 MG/DOSE, (OZEMPIC, 1 MG/DOSE,) 2 MG/1.5ML SOPN INJECT 1MG  SUBCUTANEOUSLY ONCE A WEEK 11/17/19   Ronnie Doss M, DO  SUMAtriptan (IMITREX) 100 MG tablet TAKE 1 TABLET BY MOUTH EVERY 2 HOURS AS NEEDED FOR MIGRAINE HEADACHE 10/17/19   Loman Brooklyn, FNP  telmisartan-hydrochlorothiazide (MICARDIS HCT) 40-12.5 MG tablet Take 0.5 tablets by mouth daily. 12/22/19   [name], DO    Allergies    Patient has no known allergies.  Review of Systems   Review of Systems  Constitutional: Negative for chills and fever.  HENT: Negative.   Respiratory: Negative for shortness of breath.   Cardiovascular: Negative for chest pain.  Gastrointestinal: Negative for abdominal pain, constipation, diarrhea, nausea and vomiting.  Genitourinary: Negative for dysuria, flank pain, frequency and hematuria.  Musculoskeletal: Positive for back pain. Negative for arthralgias, gait problem, joint swelling, myalgias and neck pain.  Skin: Negative for color change, rash and wound.  Neurological: Negative for weakness and numbness.       Paresthesia    Physical Exam Updated Vital Signs BP 130/81 (BP Location: Right Arm)   Pulse 91   Temp 98 F (36.7 C)   Resp 17   SpO2 97%   Physical Exam Vitals and nursing note reviewed.  Constitutional:      General: She is not in acute distress.    Appearance: Normal appearance. She is well-developed. She is not ill-appearing or  diaphoretic.     Comments: Well-appearing and in no distress  HENT:     Head: Atraumatic.  Eyes:     General:        Right eye: No discharge.        Left eye: No discharge.  Cardiovascular:     Pulses:          Radial pulses are 2+ on the right side and 2+ on the left side.       Dorsalis pedis pulses are 2+ on the right side and 2+ on the left side.       Posterior tibial pulses are 2+ on the right  side and 2+ on the left side.  Pulmonary:     Effort: Pulmonary effort is normal. No respiratory distress.  Abdominal:     General: Bowel sounds are normal. There is no distension.     Palpations: Abdomen is soft. There is no mass.     Tenderness: There is no abdominal tenderness. There is no guarding.     Comments: Abdomen soft, nondistended, nontender to palpation in all quadrants without guarding or peritoneal signs, no CVA tenderness bilaterally  Musculoskeletal:     Cervical back: Neck supple.     Comments: Tenderness to palpation over right paraspinal muscles and extending into the right buttock.  Pain made worse with range of motion of the lower extremities, positive straight leg raise on the right.  Previous surgical scar noted  Skin:    General: Skin is warm and dry.     Capillary Refill: Capillary refill takes less than 2 seconds.  Neurological:     Mental Status: She is alert and oriented to person, place, and time.     Comments: Alert, clear speech, following commands. Moving all extremities without difficulty. Bilateral lower extremities with 5/5 strength in proximal and distal muscle groups and with dorsi and plantar flexion. Sensation intact in bilateral lower extremities, patient reports paresthesias over the right big toe and in distribution of L4 dermatome 2+ patellar DTRs bilaterally. Ambulatory with steady gait  Psychiatric:        Mood and Affect: Mood normal.        Behavior: Behavior normal.     ED Results / Procedures / Treatments   Labs (all labs ordered  are listed, but only abnormal results are displayed) Labs Reviewed - No data to display  EKG None  Radiology No results found.  Procedures Procedures (including critical care time)  Medications Ordered in ED Medications - No data to display  ED Course  I have reviewed the triage vital signs and the nursing notes.  Pertinent labs & imaging results that were available during my care of the patient were reviewed by me and considered in my medical decision making (see chart for details).    MDM Rules/Calculators/A&P                          Patient presents with complaint of back pain.  Patient is nontoxic appearing, vitals are WNL. Patient has normal neurologic exam aside from some paresthesias over the big toe.  She has tenderness over the low back just to the right of midline lower lumbar spine and this extends into the buttock and down the leg.  Positive straight leg raise.  Symptoms suggest L4 radiculopathy, patient had very similar symptoms on the left several years ago and was diagnosed with a herniated disc and had surgery with significant improvement.  Ambulatory in the ED.  No back pain red flags. No urinary sxs.  Likely lumbar radiculopathy patient may very well have a herniated disc once again, but does not have symptoms that warrant emergent imaging.  Considered UTI/pyelonephritis, kidney stone, aortic aneurysm/dissection, cauda equina or epidural abscess however these do not feel these diagnoses fit clinical picture at this time.  Patient has been treating with diclofenac, baclofen and just completed a course of steroids but reports worsening pain, will provide brief course of Norco and try a different muscle relaxer, but discussed with patient that she will need to follow-up closely with her PCP and spine doctor for further management.  PCP is referring her for physical therapy.  Discussed with patient that they are not to drive or operate heavy machinery while taking Norco or  Robaxin. I discussed treatment plan, need for PCP follow-up, and return precautions with the patient. Provided opportunity for questions, patient confirmed understanding and is in agreement with plan.   Final Clinical Impression(s) / ED Diagnoses Final diagnoses:  Lumbar radiculopathy    Rx / DC Orders ED Discharge Orders         Ordered    HYDROcodone-acetaminophen (NORCO) 5-325 MG tablet  Every 6 hours PRN        02/17/20 2048    methocarbamol (ROBAXIN) 500 MG tablet  2 times daily        02/17/20 2048    HYDROcodone-acetaminophen (NORCO/VICODIN) 5-325 MG tablet  Every 6 hours PRN        02/17/20 2050           Jacqlyn Larsen, PA-C 02/17/20 2148    Lucrezia Starch, MD 02/18/20 1224

## 2020-02-17 NOTE — Telephone Encounter (Signed)
Pt returned call.   States that she needs an MRI prior to going to [facility].  Patient is in significant pain but still trying to work. She requests that MRI be ordered ASAP and appt scheduled with [facility] shortly after.

## 2020-02-17 NOTE — Telephone Encounter (Signed)
Insurance companies will not pay for an MRI without at least 6 weeks of conservative management even if this had been going on before, since that was the first visit she will need to continue with the current conservative management including anti-inflammatories and physical therapy, if she has not gone to physical therapy then please do a referral for her.  I know she would like to accelerate this process but insurance company will deny the MRI claim without any red flag symptoms and from Christy's note it does not seem to have any.  Best thing would probably have her schedule an appointment for about 6 weeks from the ninth and we can try for the MRI then.  She has to prove that she is done some chemotherapy though whether it be at home or at physical therapy Caryl Pina, MD [place] 02/17/2020, 1:24 PM

## 2020-02-17 NOTE — Discharge Instructions (Addendum)
You were seen here today for Back Pain: Low back pain is discomfort in the lower back that may be due to injuries to muscles and ligaments around the spine. Occasionally, it may be caused by a problem to a part of the spine called a disc. Your back pain should be treated with medicines listed below as well as back exercises and this back pain should get better over the next 2 weeks. Most patients get completely well in 4 weeks. It is important to know however, if you develop severe or worsening pain, low back pain with fever, numbness, weakness or inability to walk or urinate, you should return to the ER immediately.  Please follow up with your doctor this week for a recheck if still having symptoms.  HOME INSTRUCTIONS Self - care:  The application of heat can help soothe the pain.  Maintaining your daily activities, including walking (this is encouraged), as it will help you get better faster than just staying in bed. Do not life, push, pull anything more than 10 pounds for the next week. I am attaching back exercises that you can do at home to help facilitate your recovery.   Back Exercises - I have attached a handout on back exercises that can be done at home to help facilitate your recovery.   Medications are also useful to help with pain control.   Acetaminophen.  This medication is generally safe, and found over the counter. Take as directed for your age. You should not take more than 8 of the extra strength (500mg ) pills a day (max dose is 4000mg  total OVER one day)  Non steroidal anti inflammatory: This includes medications including Ibuprofen, naproxen and Mobic; These medications help both pain and swelling and are very useful in treating back pain.  They should be taken with food, as they can cause stomach upset, and more seriously, stomach bleeding. Do not combine the medications.   Lidocaine Patch: Salon Pas lidocaine patches (blue and silver box) can be purchased over the counter and worn  for 12 hours for local pain relief   Muscle relaxants:  These medications can help with muscle tightness that is a cause of lower back pain.  Most of these medications can cause drowsiness, and it is not safe to drive or use dangerous machinery while taking them. They are primarily helpful when taken at night before sleep.  You will need to follow up with your primary healthcare provider or your spine doctor in 1-2 weeks for reassessment and persistent symptoms.  I do think that physical therapy could be helpful and it looks like your primary care provider is referring you for this.  Be aware that if you develop new symptoms, such as a fever, leg weakness, difficulty with or loss of control of your urine or bowels, abdominal pain, or more severe pain, you will need to seek medical attention and/or return to the Emergency department. Additional Information:  Your vital signs today were: BP 130/81 (BP Location: Right Arm)   Pulse 91   Temp 98 F (36.7 C)   Resp 17   SpO2 97%  If your blood pressure (BP) was elevated above 135/85 this visit, please have this repeated by your doctor within one month. ---------------

## 2020-02-17 NOTE — Telephone Encounter (Signed)
REFERRAL REQUEST Telephone Note  Have you been seen at our office for this problem? yes (Advise that they may need an appointment with their PCP before a referral can be done)  Reason for Referral: pt wants referral for back pain.She said she has had back surgery at [facility]  before Referral discussed with patient:pt has been seen for back pain /not sure if referral was discussed  Best contact number of patient for referral [phone]    Has patient been seen by a specialist for this issue before: yes Patient provider preference for referral: not sure Patient location preference for referral: not sure   Patient notified that referrals can take up to a week or longer to process. If they haven't heard anything within a week they should call back and speak with the referral department.

## 2020-02-17 NOTE — ED Triage Notes (Signed)
Back pain onset a week ago

## 2020-02-17 NOTE — Telephone Encounter (Signed)
Needs appointment to be seen for reevaluation for order for MRI.

## 2020-02-18 MED FILL — Oxycodone w/ Acetaminophen Tab 5-325 MG: ORAL | Qty: 6 | Status: AC

## 2020-02-19 ENCOUNTER — Ambulatory Visit: Payer: BC Managed Care – PPO | Admitting: Family Medicine

## 2020-02-19 ENCOUNTER — Encounter: Payer: Self-pay | Admitting: Family Medicine

## 2020-02-19 ENCOUNTER — Other Ambulatory Visit: Payer: Self-pay

## 2020-02-19 VITALS — BP 106/73 | HR 95 | Temp 98.0°F

## 2020-02-19 DIAGNOSIS — Z9889 Other specified postprocedural states: Secondary | ICD-10-CM

## 2020-02-19 DIAGNOSIS — M5416 Radiculopathy, lumbar region: Secondary | ICD-10-CM | POA: Diagnosis not present

## 2020-02-19 MED ORDER — METHYLPREDNISOLONE ACETATE 80 MG/ML IJ SUSP
80.0000 mg | Freq: Once | INTRAMUSCULAR | Status: AC
  Administered 2020-02-19: 80 mg via INTRAMUSCULAR

## 2020-02-19 NOTE — Progress Notes (Signed)
BP 106/73   Pulse 95   Temp 98 F (36.7 C)   SpO2 92%    Subjective:   Patient ID: Carrie [name], female    DOB: 02-Mar-1961, 59 y.o.   MRN: [id number]  HPI: Carrie [name] is a 59 y.o. female presenting on 02/19/2020 for Back Pain (x 1+ week ago)   HPI Patient is coming in with complaints of persistent back pain that is been going on for almost 2 weeks now.  She was seen just over a week ago in a virtual visit and given some prednisone and she said that the prednisone had no effect and no help.  She says the pain is in her right lower back and then goes down the outside of her right leg and then she has numbness and pain shooting all the way down her leg between her toes.  She denies any loss of bowel or bladder.  She denies any weakness but the pain is severe enough that she is hunched over.  She has had a previous L4 laminotomy 10 years ago.  She says tizanidine helps a little bit but not much.  She has been doing home stretches and that is not helping.  We did place physical therapy referral but it has not been processed through yet.  Patient denies any trauma or anything that brought it on.  She says it is worse with sitting but better with lying down.  Relevant past medical, surgical, family and social history reviewed and updated as indicated. Interim medical history since our last visit reviewed. Allergies and medications reviewed and updated.  Review of Systems  Constitutional: Negative for chills and fever.  Eyes: Negative for visual disturbance.  Respiratory: Negative for chest tightness and shortness of breath.   Cardiovascular: Negative for chest pain and leg swelling.  Genitourinary: Negative for urgency.  Musculoskeletal: Positive for back pain and myalgias. Negative for gait problem.  Skin: Negative for rash.  Neurological: Negative for light-headedness and headaches.  Psychiatric/Behavioral: Negative for agitation and behavioral problems.  All other systems reviewed and  are negative.   Per HPI unless specifically indicated above   Allergies as of 02/19/2020   No Known Allergies     Medication List       Accurate as of February 19, 2020 10:19 AM. If you have any questions, ask your nurse or doctor.        atorvastatin 10 MG tablet Commonly known as: LIPITOR Take 1 tablet (10 mg total) by mouth daily.   baclofen 10 MG tablet Commonly known as: LIORESAL Take 1 tablet (10 mg total) by mouth 3 (three) times daily.   diclofenac 75 MG EC tablet Commonly known as: VOLTAREN Take 1 tablet (75 mg total) by mouth 2 (two) times daily.   empagliflozin 25 MG Tabs tablet Commonly known as: Jardiance TAKE 1 TABLET BY MOUTH BEFORE BREAKFAST What changed:   how much to take  how to take this  when to take this  additional instructions   HYDROcodone-acetaminophen 5-325 MG tablet Commonly known as: Norco Take 1 tablet by mouth every 6 (six) hours as needed.   HYDROcodone-acetaminophen 5-325 MG tablet Commonly known as: NORCO/VICODIN Take 1 tablet by mouth every 6 (six) hours as needed. [place] PRE-PACK   ibuprofen 400 MG tablet Commonly known as: ADVIL Take 400 mg by mouth every 6 (six) hours as needed for moderate pain.   Insulin Pen Needle 32G X 4 MM Misc Commonly known as: ReliOn  Pen Needles USE TO INJECT MEDICATION ONCE DAILY Dx 10.9   Lantus SoloStar 100 UNIT/ML Solostar Pen Generic drug: insulin glargine Inject 64-80 Units into the skin at bedtime.   methocarbamol 500 MG tablet Commonly known as: ROBAXIN Take 1 tablet (500 mg total) by mouth 2 (two) times daily.   omeprazole 40 MG capsule Commonly known as: PRILOSEC Take 1 capsule (40 mg total) by mouth daily.   ondansetron 8 MG disintegrating tablet Commonly known as: Zofran ODT Take 1 tablet (8 mg total) by mouth every 8 (eight) hours as needed for nausea or vomiting.   Ozempic (1 MG/DOSE) 2 MG/1.5ML Sopn Generic drug: Semaglutide (1 MG/DOSE) INJECT 1MG   SUBCUTANEOUSLY ONCE A WEEK What changed:   how much to take  how to take this  when to take this  additional instructions   PARoxetine 30 MG tablet Commonly known as: PAXIL Take 1 tablet (30 mg total) by mouth daily.   predniSONE 10 MG (21) Tbpk tablet Commonly known as: STERAPRED UNI-PAK 21 TAB Use as directed   SUMAtriptan 100 MG tablet Commonly known as: IMITREX TAKE 1 TABLET BY MOUTH EVERY 2 HOURS AS NEEDED FOR MIGRAINE HEADACHE What changed:   how much to take  how to take this  when to take this  reasons to take this  additional instructions   telmisartan-hydrochlorothiazide 40-12.5 MG tablet Commonly known as: MICARDIS HCT Take 0.5 tablets by mouth daily.   tiZANidine 4 MG capsule Commonly known as: ZANAFLEX Take 4 mg by mouth 3 (three) times daily as needed for muscle spasms.        Objective:   BP 106/73   Pulse 95   Temp 98 F (36.7 C)   SpO2 92%   Wt Readings from Last 3 Encounters:  11/17/19 187 lb (84.8 kg)  08/06/19 194 lb (88 kg)  07/04/19 191 lb 1.6 oz (86.7 kg)    Physical Exam Vitals and nursing note reviewed.  Constitutional:      General: She is not in acute distress.    Appearance: She is well-developed. She is not diaphoretic.  Eyes:     Conjunctiva/sclera: Conjunctivae normal.  Musculoskeletal:     Lumbar back: Swelling, tenderness and bony tenderness present. No deformity. Normal range of motion. Positive right straight leg raise test.       Back:  Skin:    General: Skin is warm and dry.     Findings: No rash.  Neurological:     Mental Status: She is alert and oriented to person, place, and time.     Coordination: Coordination normal.  Psychiatric:        Behavior: Behavior normal.       Assessment & Plan:   Problem List Items Addressed This Visit    None    Visit Diagnoses    Lumbar radiculopathy    -  Primary   Relevant Medications   tiZANidine (ZANAFLEX) 4 MG capsule   methylPREDNISolone acetate  (DEPO-MEDROL) injection 80 mg   Other Relevant Orders   MR Lumbar Spine Wo Contrast   History of spinal surgery       Relevant Medications   methylPREDNISolone acetate (DEPO-MEDROL) injection 80 mg   Other Relevant Orders   MR Lumbar Spine Wo Contrast      Patient has neurosurgery appointment with Dr. Saintclair Halsted but they will get the MRI before if we can. Follow up plan: Return in about 5 weeks (around 03/25/2020), or if symptoms worsen or fail to improve, for  Follow-up back pain.  Counseling provided for all of the vaccine components Orders Placed This Encounter  Procedures  . MR Lumbar Spine Wo Contrast    Caryl Pina, MD [facility] 02/19/2020, 10:19 AM

## 2020-02-21 ENCOUNTER — Encounter (HOSPITAL_COMMUNITY): Payer: Self-pay | Admitting: *Deleted

## 2020-02-21 ENCOUNTER — Other Ambulatory Visit: Payer: Self-pay

## 2020-02-21 ENCOUNTER — Emergency Department (HOSPITAL_COMMUNITY)
Admission: EM | Admit: 2020-02-21 | Discharge: 2020-02-21 | Disposition: A | Discharge status: Home / Self Care | Payer: BC Managed Care – PPO | Attending: Emergency Medicine | Admitting: Emergency Medicine

## 2020-02-21 ENCOUNTER — Emergency Department (HOSPITAL_COMMUNITY): Payer: BC Managed Care – PPO

## 2020-02-21 DIAGNOSIS — M5416 Radiculopathy, lumbar region: Secondary | ICD-10-CM | POA: Diagnosis not present

## 2020-02-21 DIAGNOSIS — I10 Essential (primary) hypertension: Secondary | ICD-10-CM | POA: Diagnosis not present

## 2020-02-21 DIAGNOSIS — Z79899 Other long term (current) drug therapy: Secondary | ICD-10-CM | POA: Diagnosis not present

## 2020-02-21 DIAGNOSIS — Z794 Long term (current) use of insulin: Secondary | ICD-10-CM | POA: Diagnosis not present

## 2020-02-21 DIAGNOSIS — E1169 Type 2 diabetes mellitus with other specified complication: Secondary | ICD-10-CM | POA: Insufficient documentation

## 2020-02-21 DIAGNOSIS — M545 Low back pain: Secondary | ICD-10-CM | POA: Diagnosis not present

## 2020-02-21 DIAGNOSIS — Z87891 Personal history of nicotine dependence: Secondary | ICD-10-CM | POA: Insufficient documentation

## 2020-02-21 MED ORDER — KETOROLAC TROMETHAMINE 30 MG/ML IJ SOLN
30.0000 mg | Freq: Once | INTRAMUSCULAR | Status: AC
  Administered 2020-02-21: 30 mg via INTRAMUSCULAR
  Filled 2020-02-21: qty 1

## 2020-02-21 MED ORDER — METHOCARBAMOL 500 MG PO TABS
500.0000 mg | ORAL_TABLET | Freq: Once | ORAL | Status: AC
  Administered 2020-02-21: 500 mg via ORAL
  Filled 2020-02-21: qty 1

## 2020-02-21 MED ORDER — KETOROLAC TROMETHAMINE 30 MG/ML IJ SOLN: 30.0000 mg | Freq: Once | INTRAMUSCULAR | Status: DC

## 2020-02-21 MED ORDER — LORAZEPAM 2 MG/ML IJ SOLN: 1.0000 mg | Freq: Once | INTRAMUSCULAR | Status: DC

## 2020-02-21 MED ORDER — MORPHINE SULFATE (PF) 4 MG/ML IV SOLN
4.0000 mg | Freq: Once | INTRAVENOUS | Status: AC
  Administered 2020-02-21: 4 mg via INTRAMUSCULAR
  Filled 2020-02-21: qty 1

## 2020-02-21 MED ORDER — LIDOCAINE 5 % EX PTCH: 1.0000 | MEDICATED_PATCH | CUTANEOUS | 0 refills | Status: DC

## 2020-02-21 MED ORDER — LIDOCAINE 5 % EX PTCH
1.0000 | MEDICATED_PATCH | CUTANEOUS | Status: DC
  Administered 2020-02-21: 1 via TRANSDERMAL
  Filled 2020-02-21: qty 1

## 2020-02-21 MED ORDER — MORPHINE SULFATE (PF) 4 MG/ML IV SOLN: 4.0000 mg | Freq: Once | INTRAVENOUS | Status: DC

## 2020-02-21 NOTE — Discharge Instructions (Addendum)
Keep your appointment with Dr. Saintclair Halsted in 3 days.  Continue taking your home medications  Return for any worsening symptoms

## 2020-02-21 NOTE — ED Notes (Signed)
Patient verbalizes understanding of discharge instructions . Opportunity for questions and answers were provided . Armband removed by staff ,Pt discharged from ED. W/C  offered at D/C  and Declined W/C at D/C and was escorted to lobby by RN.  

## 2020-02-21 NOTE — ED Triage Notes (Signed)
C/o lower back pain onset 2 weeks ago , states she has seen her PCP and now has an appointment with Dr. Saintclair Halsted 9/21 however her insurance is taking to long for MRI and she is here today for MRi. Medications not helping

## 2020-02-21 NOTE — ED Provider Notes (Signed)
[facility] EMERGENCY DEPARTMENT Provider Note   CSN: [id number] Arrival date & time: 02/21/20  7026    History Chief Complaint  Patient presents with  . Back Pain    Carrie [name] is a 59 y.o. female with history significant for diabetes, hypertension, hyperlipidemia, prior lumbar surgery who presents for evaluation of back pain. Patient states she has had back pain onset 2 weeks ago which has been worsening. She has been to her PCP twice as well as here to the emergency department without relief. Pain located to right gluteal region and extends into her right great toe. Feels like she has some tingling to her great toe. No saddle paresthesias. No bowel or bladder incontinence. No history of IV drug use. Patient has appointment Dr. Saintclair Halsted on Tuesday for evaluation however patient states she will have to reschedule if she can cannot get her MRI before then. Patient states she has been trying to get MRI with her PCP for 2 weeks however they have delayed scheduling this "due to Covid." Patient rates her pain a 10/10. It is uncomfortable to sleep as well as move around. Denies fever, chills, nausea, vomiting, chest pain, shortness of breath abdominal pain, dysuria, hematuria, rashes, lesions, unilateral leg swelling, redness or warmth. Denies aggravating or alleviating factors. She has been taking diclofenac, muscle relaxer, steroids,Norco without relief. Denies additional aggravating or alleviating factors. She did have prior lumbar surgery "years ago."  History obtained from patient and past medical records. No interpreter is used.  HPI     Past Medical History:  Diagnosis Date  . Depression   . Diabetes mellitus without complication ([place])   . Fever blister   . Gastroparesis   . Hyperlipidemia   . Hypertension   . Migraine     Patient Active Problem List   Diagnosis Date Noted  . Hyperlipidemia associated with type 2 diabetes mellitus ([place]) 01/23/2019  . Morbid  obesity ([place]) 11/13/2017  . Type 1 diabetes mellitus without complications ([place]) 37/85/8850  . Hypertension associated with diabetes ([place]) 02/27/2017  . Tinnitus of both ears 02/26/2017  . Dizziness 02/26/2017  . Depression, major, single episode, in partial remission ([place]) 07/07/2016  . Migraine 07/07/2016  . Special screening for malignant neoplasms, colon 04/03/2016  . Irritable bowel syndrome (IBS) 04/03/2016    Past Surgical History:  Procedure Laterality Date  . BACK SURGERY    . CHOLECYSTECTOMY  2004     OB History   No obstetric history on file.     Family History  Problem Relation Age of Onset  . Ovarian cancer Mother   . Mental illness Daughter   . Migraines Daughter   . Colon cancer Neg Hx   . Rectal cancer Neg Hx   . Throat cancer Neg Hx     Social History   Tobacco Use  . Smoking status: Former Smoker    Packs/day: 0.50    Years: 40.00    Pack years: 20.00    Types: Cigarettes    Quit date: 2006    Years since quitting: 15.7  . Smokeless tobacco: Never Used  Vaping Use  . Vaping Use: Never used  Substance Use Topics  . Alcohol use: Not Currently    Comment: occasional   . Drug use: No    Home Medications Prior to Admission medications   Medication Sig Start Date End Date Taking? Authorizing Provider  atorvastatin (LIPITOR) 10 MG tablet Take 1 tablet (10 mg total) by mouth daily. 11/17/19  Ronnie Doss M, DO  baclofen (LIORESAL) 10 MG tablet Take 1 tablet (10 mg total) by mouth 3 (three) times daily. 02/12/20   Sharion Balloon, FNP  diclofenac (VOLTAREN) 75 MG EC tablet Take 1 tablet (75 mg total) by mouth 2 (two) times daily. 02/12/20   Evelina Dun A, FNP  empagliflozin (JARDIANCE) 25 MG TABS tablet TAKE 1 TABLET BY MOUTH BEFORE BREAKFAST Patient taking differently: Take 25 mg by mouth daily.  11/17/19   [name], DO  HYDROcodone-acetaminophen (NORCO) 5-325 MG tablet Take 1 tablet by mouth every 6 (six) hours as needed. 02/17/20    Jacqlyn Larsen, PA-C  HYDROcodone-acetaminophen (NORCO/VICODIN) 5-325 MG tablet Take 1 tablet by mouth every 6 (six) hours as needed. [place] PRE-PACK 02/17/20   Jacqlyn Larsen, PA-C  ibuprofen (ADVIL) 400 MG tablet Take 400 mg by mouth every 6 (six) hours as needed for moderate pain.    [provider]  Insulin Glargine (LANTUS SOLOSTAR) 100 UNIT/ML Solostar Pen Inject 64-80 Units into the skin at bedtime. 08/06/19   [name], DO  Insulin Pen Needle (RELION PEN NEEDLES) 32G X 4 MM MISC USE TO INJECT MEDICATION ONCE DAILY Dx 10.9 08/20/18   Terald Sleeper, PA-C  lidocaine (LIDODERM) 5 % Place 1 patch onto the skin daily. Remove & Discard patch within 12 hours or as directed by MD 02/21/20   Gal Smolinski A, PA-C  methocarbamol (ROBAXIN) 500 MG tablet Take 1 tablet (500 mg total) by mouth 2 (two) times daily. 02/17/20   Jacqlyn Larsen, PA-C  omeprazole (PRILOSEC) 40 MG capsule Take 1 capsule (40 mg total) by mouth daily. 11/13/17   Terald Sleeper, PA-C  ondansetron (ZOFRAN ODT) 8 MG disintegrating tablet Take 1 tablet (8 mg total) by mouth every 8 (eight) hours as needed for nausea or vomiting. 08/07/18   Terald Sleeper, PA-C  PARoxetine (PAXIL) 30 MG tablet Take 1 tablet (30 mg total) by mouth daily. 11/17/19   [name], DO  predniSONE (STERAPRED UNI-PAK 21 TAB) 10 MG (21) TBPK tablet Use as directed 02/12/20   Evelina Dun A, FNP  Semaglutide, 1 MG/DOSE, (OZEMPIC, 1 MG/DOSE,) 2 MG/1.5ML SOPN INJECT 1MG  SUBCUTANEOUSLY ONCE A WEEK Patient taking differently: Inject 1 mg into the skin once a week. Sunday 11/17/19   [name], DO  SUMAtriptan (IMITREX) 100 MG tablet TAKE 1 TABLET BY MOUTH EVERY 2 HOURS AS NEEDED FOR MIGRAINE HEADACHE Patient taking differently: Take 100 mg by mouth every 2 (two) hours as needed for migraine or headache.  10/17/19   Loman Brooklyn, FNP  telmisartan-hydrochlorothiazide (MICARDIS HCT) 40-12.5 MG tablet Take 0.5 tablets by mouth daily.  12/22/19   [name], DO  tiZANidine (ZANAFLEX) 4 MG capsule Take 4 mg by mouth 3 (three) times daily as needed for muscle spasms.    [provider]    Allergies    Patient has no known allergies.  Review of Systems   Review of Systems  Constitutional: Negative.   HENT: Negative.   Respiratory: Negative.   Cardiovascular: Negative.   Gastrointestinal: Negative.   Genitourinary: Negative.   Musculoskeletal: Positive for back pain.  Skin: Negative.   Neurological: Negative.   All other systems reviewed and are negative.   Physical Exam Updated Vital Signs BP 106/77 (BP Location: Right Arm)   Pulse 80   Temp 97.9 F (36.6 C) (Oral)   Resp (!) 22   Ht 5\' 6"  (1.676  m)   Wt 82.6 kg   SpO2 96%   BMI 29.38 kg/m   Physical Exam  Physical Exam  Constitutional: Pt appears well-developed and well-nourished. No distress.  HENT:  Head: Normocephalic and atraumatic.  Mouth/Throat: Oropharynx is clear and moist. No oropharyngeal exudate.  Eyes: Conjunctivae are normal.  Neck: Normal range of motion. Neck supple.  Full ROM without pain  Cardiovascular: Normal rate, regular rhythm and intact distal pulses.   Pulmonary/Chest: Effort normal and breath sounds normal. No respiratory distress. Pt has no wheezes.  Abdominal: Soft. Pt exhibits no distension. There is no tenderness, rebound or guarding. No abd bruit or pulsatile mass Musculoskeletal:  Full range of motion of the T-spine and L-spine with flexion, hyperextension, and lateral flexion. No midline tenderness or stepoffs. No tenderness to palpation of the spinous processes of the T-spine or L-spine. Moderate tenderness to palpation of the paraspinous muscles of the L-spine on RIGHT. Positive straight leg raise on right at 40'. Tenderness over right piriformis and SI joint. No bony tenderness to Bilateral lower extremtities. Compartments soft. Pelvis stable non tender to palpation. Lymphadenopathy:    Pt has no  cervical adenopathy.  Neurological: Pt is alert. Pt has normal reflexes.  Reflex Scores:      Bicep reflexes are 2+ on the right side and 2+ on the left side.      Brachioradialis reflexes are 2+ on the right side and 2+ on the left side.      Patellar reflexes are 2+ on the right side and 2+ on the left side.      Achilles reflexes are 2+ on the right side and 2+ on the left side. Speech is clear and goal oriented, follows commands Normal 5/5 strength in upper and lower extremities bilaterally including dorsiflexion and plantar flexion, strong and equal grip strength Sensation normal to light and sharp touch Moves extremities without ataxia, coordination intact Normal gait Normal balance No Clonus Skin: Skin is warm and dry. No rash noted or lesions noted. Pt is not diaphoretic. No erythema, ecchymosis,edema or warmth.  Psychiatric: Pt has a normal mood and affect. Behavior is normal.  Nursing note and vitals reviewed. ED Results / Procedures / Treatments   Labs (all labs ordered are listed, but only abnormal results are displayed) Labs Reviewed - No data to display  EKG None  Radiology MR LUMBAR SPINE WO CONTRAST  Result Date: 02/21/2020 CLINICAL DATA:  Low back pain 2 weeks.  Prior back surgery. EXAM: MRI LUMBAR SPINE WITHOUT CONTRAST TECHNIQUE: Multiplanar, multisequence MR imaging of the lumbar spine was performed. No intravenous contrast was administered. COMPARISON:  MRI lumbar spine 02/01/2010 FINDINGS: Segmentation:  Normal Alignment:  Mild retrolisthesis L1-2, L2-3, L3-4 may Vertebrae:  Normal bone marrow.  Negative for fracture or mass. Conus medullaris and cauda equina: Conus extends to the L1-2 level. Conus and cauda equina appear normal. Paraspinal and other soft tissues: Negative for paraspinous mass or adenopathy or fluid collection. Disc levels: L1-2: Small central disc protrusion. Small left foraminal disc protrusion and spurring. Mild subarticular stenosis on the left.  Findings have progressed from the prior MRI. L2-3: Mild retrolisthesis. Diffuse bulging of the disc and mild facet degeneration. Moderate subarticular stenosis bilaterally with progression from the prior study. Mild spinal stenosis. L3-4: Mild retrolisthesis. Diffuse disc bulging and endplate spurring. Mild facet and ligamentum flavum hypertrophy. Moderate subarticular stenosis bilaterally with progression. Mild spinal stenosis. L4-5: Left laminectomy. Small central and right-sided disc protrusion unchanged. Bilateral facet hypertrophy. Moderate subarticular  stenosis bilaterally unchanged from the prior study. L5-S1: Extruded disc fragment in the right foramen is new since the prior study. Displacement right L5 nerve root. Mild facet degeneration bilaterally. S1 nerve roots exit without impingement. IMPRESSION: Progressive degenerative changes at L1-2, L2-3, L3-4 compared with 2011 Left laminectomy L4-5. Diffuse disc bulging with moderate subarticular stenosis bilaterally unchanged Extruded disc fragment right foramen L5-S1 with right L5 nerve root impingement. Electronically Signed   By: Franchot Gallo M.D.   On: 02/21/2020 12:22    Procedures Procedures (including critical care time)  Medications Ordered in ED Medications  lidocaine (LIDODERM) 5 % 1 patch (1 patch Transdermal Patch Applied 02/21/20 1245)  ketorolac (TORADOL) 30 MG/ML injection 30 mg (30 mg Intramuscular Given 02/21/20 1249)  morphine 4 MG/ML injection 4 mg (4 mg Intramuscular Given 02/21/20 1253)  methocarbamol (ROBAXIN) tablet 500 mg (500 mg Oral Given 02/21/20 1244)   ED Course  I have reviewed the triage vital signs and the nursing notes.  Pertinent labs & imaging results that were available during my care of the patient were reviewed by me and considered in my medical decision making (see chart for details).  59 year old presents for evaluation of back pain. Afebrile, non septic, non ill appearing.  Back pain x2 weeks.  Has  appoint with neurosurgery on 21st.  Unfortunately has not been able to obtain MRI outpatient due to insurance issues prior to her appointment.  Patient with worsening pain to right gluteus, piriformis area and extends into her right great toe.  She has no bowel or bladder incontinence she has no saddle paresthesias, history of IV drug use or active malignancy.  Patient exam consistent with radiculopathy.  MRI here today shows L5 impingement which is consistent with her exam.  I have low suspicion for acute neurosurgical emergency such as cauda equina, discitis, osteomyelitis, transverse myelitis, psoas abscess, bacterial infectious process, dissection, AAA as cause of her pain.  Her pain was controlled here in the emergency department.  She does still have prescription for opiate pain medication as well as steroids from her previous emergency department visit 4 days ago.  I encouraged her to keep taking these as well as added on lidocaine patches.  She is ambulatory without difficulty. Will have her keep appointment with Neurosurgery.  Will return for any worsening symptoms.  The patient has been appropriately medically screened and/or stabilized in the ED. I have low suspicion for any other emergent medical condition which would require further screening, evaluation or treatment in the ED or require inpatient management.  Patient is hemodynamically stable and in no acute distress.  Patient able to ambulate in department prior to ED.  Evaluation does not show acute pathology that would require ongoing or additional emergent interventions while in the emergency department or further inpatient treatment.  I have discussed the diagnosis with the patient and answered all questions.  Pain is been managed while in the emergency department and patient has no further complaints prior to discharge.  Patient is comfortable with plan discussed in room and is stable for discharge at this time.  I have discussed strict  return precautions for returning to the emergency department.  Patient was encouraged to follow-up with PCP/specialist refer to at discharge.    MDM Rules/Calculators/A&P                           Final Clinical Impression(s) / ED Diagnoses Final diagnoses:  Lumbar nerve root  impingement    Rx / DC Orders ED Discharge Orders         Ordered    lidocaine (LIDODERM) 5 %  Every 24 hours        02/21/20 1315           Mikaela Hilgeman A, PA-C 02/21/20 1347    Maudie Flakes, MD 02/21/20 1645

## 2020-02-23 ENCOUNTER — Telehealth: Payer: Self-pay | Admitting: Family Medicine

## 2020-02-24 ENCOUNTER — Encounter: Payer: Self-pay | Admitting: Family Medicine

## 2020-02-24 DIAGNOSIS — M5126 Other intervertebral disc displacement, lumbar region: Secondary | ICD-10-CM | POA: Diagnosis not present

## 2020-02-25 ENCOUNTER — Ambulatory Visit: Payer: BC Managed Care – PPO | Admitting: Family Medicine

## 2020-02-25 ENCOUNTER — Ambulatory Visit (HOSPITAL_COMMUNITY): Payer: BC Managed Care – PPO | Attending: Family Medicine

## 2020-02-25 ENCOUNTER — Other Ambulatory Visit: Payer: Self-pay | Admitting: Family Medicine

## 2020-02-25 DIAGNOSIS — M5416 Radiculopathy, lumbar region: Secondary | ICD-10-CM

## 2020-02-27 ENCOUNTER — Encounter: Payer: Self-pay | Admitting: Family Medicine

## 2020-03-01 ENCOUNTER — Encounter: Payer: Self-pay | Admitting: Physical Therapy

## 2020-03-01 ENCOUNTER — Ambulatory Visit: Payer: BC Managed Care – PPO | Attending: Family Medicine | Admitting: Physical Therapy

## 2020-03-01 DIAGNOSIS — M5416 Radiculopathy, lumbar region: Secondary | ICD-10-CM | POA: Insufficient documentation

## 2020-03-01 DIAGNOSIS — M5441 Lumbago with sciatica, right side: Secondary | ICD-10-CM | POA: Diagnosis not present

## 2020-03-01 DIAGNOSIS — R293 Abnormal posture: Secondary | ICD-10-CM

## 2020-03-01 NOTE — Therapy (Signed)
[facility] [place], [place], [address] Phone: [phone]   Fax:  [phone]  Physical Therapy Evaluation  Patient Details  Name: Carrie [name] MRN: [id number] Date of Birth: 1960/09/11 Referring Provider (PT): Ronnie Doss DO.   Encounter Date: 03/01/2020   PT End of Session - 03/01/20 1511    Visit Number 1    Number of Visits 12    Date for PT Re-Evaluation 04/12/20    PT Start Time 0107    PT Stop Time 0157    PT Time Calculation (min) 50 min    Activity Tolerance Patient tolerated treatment well    Behavior During Therapy [facility] for tasks assessed/performed           Past Medical History:  Diagnosis Date  . Depression   . Diabetes mellitus without complication ([place])   . Fever blister   . Gastroparesis   . Hyperlipidemia   . Hypertension   . Migraine     Past Surgical History:  Procedure Laterality Date  . BACK SURGERY    . CHOLECYSTECTOMY  2004    There were no vitals filed for this visit.    Subjective Assessment - 03/01/20 1507    Subjective COVID-19 screen performed prior to patient entering clinic.  The patient presents to the clinic today with c/o right sided low back pain with radiation into her right buttock and posterior thigh.  She feels it is related to slipping on water at work about a month ago and falling on her buttocks.  She was in severe pain.  The pain was improved as of late.  Her pain is rated at a 6/10 today.    Pertinent History DM, HTN, previous lumbar surgery.    How long can you sit comfortably? "Not very long."    How long can you stand comfortably? "Not very long."    Diagnostic tests MRI.    Patient Stated Goals Get out of pain.    Currently in Pain? Yes    Pain Score 6     Pain Location Back    Pain Orientation Right    Pain Descriptors / Indicators Aching;Shooting    Pain Type Acute pain    Pain Onset 1 to 4 weeks ago    Pain Frequency Constant    Aggravating  Factors  Sitting/standing.    Pain Relieving Factors "Nothing."              [facility] PT Assessment - 03/01/20 0001      Assessment   Medical Diagnosis Lumbar radiculopathy.    Referring Provider (PT) Ronnie Doss DO.    Onset Date/Surgical Date --   ~ a months ago.     Precautions   Precautions None      Restrictions   Weight Bearing Restrictions No      Balance Screen   Has the patient fallen in the past 6 months Yes    How many times? --   1.   Has the patient had a decrease in activity level because of a fear of falling?  No    Is the patient reluctant to leave their home because of a fear of falling?  No      Home Environment   Living Environment Private residence      Prior Function   Level of Independence Independent      Posture/Postural Control   Posture/Postural Control Postural limitations    Postural Limitations Rounded Shoulders;Forward head;Decreased lumbar  lordosis;Flexed trunk      Deep Tendon Reflexes   DTR Assessment Site Patella;Achilles    Patella DTR 2+    Achilles DTR 2+      ROM / Strength   AROM / PROM / Strength AROM;Strength      AROM   Overall AROM Comments Lumbar flexion performed slowly to 50% of normal range and extension limited to 10 degrees.      Strength   Overall Strength Comments Normal LE strength.      Palpation   Palpation comment CC of palpable pain is over her right upper gluteal region.      Special Tests   Other special tests Pain with a right SLR.  Equal leg lengths.  (-) FABER testing.      Ambulation/Gait   Gait Comments Patient walking in obvious pain in trunk flexion and some lateral sidebending.                      Objective measurements completed on examination: See above findings.       [facility] Adult PT Treatment/Exercise - 03/01/20 0001      Modalities   Modalities Electrical Stimulation;Moist Heat      Moist Heat Therapy   Number Minutes Moist Heat 20 Minutes    Moist Heat  Location Lumbar Spine      Electrical Stimulation   Electrical Stimulation Location Right upper gluteal region.    Electrical Stimulation Action Pre-mod.    Electrical Stimulation Parameters 80-150 Hz x 20 minutes.    Electrical Stimulation Goals Tone;Pain                       PT Long Term Goals - 03/01/20 1552      PT LONG TERM GOAL #1   Title Independent with a HEP.    Time 6    Period Weeks    Status New      PT LONG TERM GOAL #2   Title Sit 20 minutes with pain not > 2-3/10.    Time 6    Period Weeks    Status New      PT LONG TERM GOAL #3   Title Stand 20 minutes with pain not > 2-3/10.    Time 6    Period Weeks    Status New      PT LONG TERM GOAL #4   Title Eliminate right LE symptoms.    Time 6    Period Weeks    Status New                  Plan - 03/01/20 1528    Clinical Impression Statement The patient presents to OPPT with c/o right sided low back pain with radiation of pain into her right buttock and posterior thigh.  This was likely due to a fall at work when she slipped on water.  She is very tender to palpation over her right upper gluteal region.  Her spinal range of motion is limited and painful.  She has pain with a right SLR.  Her gait is antalgic in nature.  She cannot sit or stand for prolonged periods of time.  Patient will benefit from skilled physical therapy intervention to address deficits and pain.    Personal Factors and Comorbidities Comorbidity 1;Comorbidity 2    Comorbidities DM, HTN, previous lumbar surgery.    Examination-Activity Limitations Locomotion Level;Sit;Stand    Examination-Participation Restrictions Other  Stability/Clinical Decision Making Stable/Uncomplicated    Clinical Decision Making Low    Rehab Potential Good    PT Frequency 2x / week    PT Duration 6 weeks    PT Treatment/Interventions ADLs/Self Care Home Management;Cryotherapy;Electrical Stimulation;Ultrasound;Moist Heat;Therapeutic  activities;Therapeutic exercise;Manual techniques;Patient/family education;Passive range of motion;Dry needling    PT Next Visit Plan FOTO...Marland KitchenMarland KitchenCore exercise progression, dry needling, combo e'stim/US; STW/M, HMP and electrical stimulation    Consulted and Agree with Plan of Care Patient           Patient will benefit from skilled therapeutic intervention in order to improve the following deficits and impairments:  Pain, Postural dysfunction, Increased muscle spasms, Decreased activity tolerance, Decreased range of motion  Visit Diagnosis: Acute right-sided low back pain with right-sided sciatica  Radiculopathy, lumbar region     Problem List Patient Active Problem List   Diagnosis Date Noted  . Hyperlipidemia associated with type 2 diabetes mellitus ([place]) 01/23/2019  . Morbid obesity ([place]) 11/13/2017  . Type 1 diabetes mellitus without complications ([place]) 03/55/9741  . Hypertension associated with diabetes ([place]) 02/27/2017  . Tinnitus of both ears 02/26/2017  . Dizziness 02/26/2017  . Depression, major, single episode, in partial remission ([place]) 07/07/2016  . Migraine 07/07/2016  . Special screening for malignant neoplasms, colon 04/03/2016  . Irritable bowel syndrome (IBS) 04/03/2016    Jahkari Maclin, [place] MPT 03/01/2020, 4:00 PM  [facility] [address] [place], [place], [address] Phone: [phone]   Fax:  [phone]  Name: Carrie [name] MRN: [id number] Date of Birth: 04/16/1961

## 2020-03-02 ENCOUNTER — Ambulatory Visit (INDEPENDENT_AMBULATORY_CARE_PROVIDER_SITE_OTHER): Payer: BC Managed Care – PPO | Admitting: Family Medicine

## 2020-03-02 ENCOUNTER — Other Ambulatory Visit: Payer: Self-pay

## 2020-03-02 VITALS — BP 116/67 | HR 94 | Temp 97.7°F | Ht 66.0 in | Wt 186.0 lb

## 2020-03-02 DIAGNOSIS — M5416 Radiculopathy, lumbar region: Secondary | ICD-10-CM | POA: Diagnosis not present

## 2020-03-02 DIAGNOSIS — M48061 Spinal stenosis, lumbar region without neurogenic claudication: Secondary | ICD-10-CM

## 2020-03-02 MED ORDER — GABAPENTIN 300 MG PO CAPS: 300.0000 mg | ORAL_CAPSULE | Freq: Every day | ORAL | 0 refills | Status: DC

## 2020-03-02 MED ORDER — METHOCARBAMOL 500 MG PO TABS: 500.0000 mg | ORAL_TABLET | Freq: Two times a day (BID) | ORAL | 0 refills | Status: DC | PRN

## 2020-03-02 MED ORDER — OXYCODONE-ACETAMINOPHEN 5-325 MG PO TABS: 1.0000 | ORAL_TABLET | Freq: Four times a day (QID) | ORAL | 0 refills | Status: DC | PRN

## 2020-03-02 NOTE — Progress Notes (Addendum)
Subjective: CC: low back pain PCP: Carrie Norlander, DO Carrie:EAVW RUCHY [name] is a 59 y.o. female presenting to clinic today for:  1. Low back pain Patient with about 4-week history of low back pain now.  She had an MRI of the spine which did show some L5 nerve root impingement.  She has follow-up with Carrie [name] in about 3 weeks to start spinal injections.  She just tolerated physical therapy yesterday.  Pain is still quite severe and seems to be exacerbated at bedtime.  She does need a refill on the Robaxin.  She was taking some of her spouse's gabapentin which she found helpful.  She would like to get her own prescription for this.  Norco was minimally effective.  She has been alternating with Motrin and Tylenol.  Pain seems to be predominantly in the right lower buttock radiating down the right lower leg.  She does report some numbness and tingling sensation in the first 3 digits of the right foot.  Denies any falls or weakness.   ROS: Per HPI  No Known Allergies Past Medical History:  Diagnosis Date  . Depression   . Diabetes mellitus without complication ([place])   . Fever blister   . Gastroparesis   . Hyperlipidemia   . Hypertension   . Migraine     Current Outpatient Medications:  .  atorvastatin (LIPITOR) 10 MG tablet, Take 1 tablet (10 mg total) by mouth daily., Disp: 90 tablet, Rfl: 3 .  empagliflozin (JARDIANCE) 25 MG TABS tablet, TAKE 1 TABLET BY MOUTH BEFORE BREAKFAST (Patient taking differently: Take 25 mg by mouth daily. ), Disp: 90 tablet, Rfl: 3 .  ibuprofen (ADVIL) 400 MG tablet, Take 400 mg by mouth every 6 (six) hours as needed for moderate pain., Disp: , Rfl:  .  Insulin Glargine (LANTUS SOLOSTAR) 100 UNIT/ML Solostar Pen, Inject 64-80 Units into the skin at bedtime., Disp: 30 mL, Rfl: 12 .  Insulin Pen Needle (RELION PEN NEEDLES) 32G X 4 MM MISC, USE TO INJECT MEDICATION ONCE DAILY Dx 10.9, Disp: 100 each, Rfl: 3 .  lidocaine (LIDODERM) 5 %, Place 1 patch onto the  skin daily. Remove & Discard patch within 12 hours or as directed by MD, Disp: 30 patch, Rfl: 0 .  methocarbamol (ROBAXIN) 500 MG tablet, Take 1 tablet (500 mg total) by mouth 2 (two) times daily., Disp: 20 tablet, Rfl: 0 .  omeprazole (PRILOSEC) 40 MG capsule, Take 1 capsule (40 mg total) by mouth daily., Disp: 30 capsule, Rfl: 11 .  ondansetron (ZOFRAN ODT) 8 MG disintegrating tablet, Take 1 tablet (8 mg total) by mouth every 8 (eight) hours as needed for nausea or vomiting., Disp: 20 tablet, Rfl: 0 .  PARoxetine (PAXIL) 30 MG tablet, Take 1 tablet (30 mg total) by mouth daily., Disp: 90 tablet, Rfl: 2 .  Semaglutide, 1 MG/DOSE, (OZEMPIC, 1 MG/DOSE,) 2 MG/1.5ML SOPN, INJECT 1MG  SUBCUTANEOUSLY ONCE A WEEK (Patient taking differently: Inject 1 mg into the skin once a week. Sunday), Disp: 12 mL, Rfl: 3 .  SUMAtriptan (IMITREX) 100 MG tablet, TAKE 1 TABLET BY MOUTH EVERY 2 HOURS AS NEEDED FOR MIGRAINE HEADACHE (Patient taking differently: Take 100 mg by mouth every 2 (two) hours as needed for migraine or headache. ), Disp: 10 tablet, Rfl: 0 .  telmisartan-hydrochlorothiazide (MICARDIS HCT) 40-12.5 MG tablet, Take 0.5 tablets by mouth daily., Disp: 45 tablet, Rfl: 1 .  tiZANidine (ZANAFLEX) 4 MG capsule, Take 4 mg by mouth 3 (three) times  daily as needed for muscle spasms., Disp: , Rfl:  .  baclofen (LIORESAL) 10 MG tablet, Take 1 tablet (10 mg total) by mouth 3 (three) times daily. (Patient not taking: Reported on 03/02/2020), Disp: 30 each, Rfl: 0 .  diclofenac (VOLTAREN) 75 MG EC tablet, Take 1 tablet (75 mg total) by mouth 2 (two) times daily. (Patient not taking: Reported on 03/02/2020), Disp: 30 tablet, Rfl: 0 .  HYDROcodone-acetaminophen (NORCO) 5-325 MG tablet, Take 1 tablet by mouth every 6 (six) hours as needed. (Patient not taking: Reported on 03/02/2020), Disp: 6 tablet, Rfl: 0 .  HYDROcodone-acetaminophen (NORCO/VICODIN) 5-325 MG tablet, Take 1 tablet by mouth every 6 (six) hours as needed. Carrie  [name] PRE-PACK (Patient not taking: Reported on 03/02/2020), Disp: 6 tablet, Rfl: 0 Social History   Socioeconomic History  . Marital status: Married    Spouse name: Not on file  . Number of children: 2  . Years of education: Not on file  . Highest education level: Not on file  Occupational History  . Occupation: customer service  Tobacco Use  . Smoking status: Former Smoker    Packs/day: 0.50    Years: 40.00    Pack years: 20.00    Types: Cigarettes    Quit date: 2006    Years since quitting: 15.7  . Smokeless tobacco: Never Used  Vaping Use  . Vaping Use: Never used  Substance and Sexual Activity  . Alcohol use: Not Currently    Comment: occasional   . Drug use: No  . Sexual activity: Not on file  Other Topics Concern  . Not on file  Social History Narrative   .      Social Determinants of Health   Financial Resource Strain:   . Difficulty of Paying Living Expenses: Not on file  Food Insecurity:   . Worried About Charity fundraiser in the Last Year: Not on file  . Ran Out of Food in the Last Year: Not on file  Transportation Needs:   . Lack of Transportation (Medical): Not on file  . Lack of Transportation (Non-Medical): Not on file  Physical Activity:   . Days of Exercise per Week: Not on file  . Minutes of Exercise per Session: Not on file  Stress:   . Feeling of Stress : Not on file  Social Connections:   . Frequency of Communication with Friends and Family: Not on file  . Frequency of Social Gatherings with Friends and Family: Not on file  . Attends Religious Services: Not on file  . Active Member of Clubs or Organizations: Not on file  . Attends Archivist Meetings: Not on file  . Marital Status: Not on file  Intimate Partner Violence:   . Fear of Current or Ex-Partner: Not on file  . Emotionally Abused: Not on file  . Physically Abused: Not on file  . Sexually Abused: Not on file   Family History  Problem Relation Age of Onset  . Ovarian  cancer Mother   . Mental illness Daughter   . Migraines Daughter   . Colon cancer Neg Hx   . Rectal cancer Neg Hx   . Throat cancer Neg Hx     Objective: Office vital signs reviewed. BP 116/67   Pulse 94   Temp 97.7 F (36.5 C) (Temporal)   Ht 5\' 6"  (1.676 m)   Wt 186 lb (84.4 kg)   BMI 30.02 kg/m   Physical Examination:  General: Awake, alert, appears  tired and uncomfortable, No acute distress Extremities: warm, well perfused, No edema, cyanosis or clubbing; +2 pulses bilaterally MSK: antalgic gait and station  Lumbar spine: No midline tenderness palpation.  She has exquisite tenderness palpation in the right SI and gluteal region. Skin: dry; intact; no rashes or lesions Neuro: 4/5 right lower extremity strength; 5 /5 left lower extremity strength and lower extremity light touch sensation grossly intact.  Assessment/ Plan: 59 y.o. female   1. Lumbar nerve root impingement I reviewed her MRI spine.  She is currently just starting physical therapy with ultimate plans to get back injections performed in about 3 weeks.  I will CC her note today to her neurosurgeon.  I am sure she would love to get these injections sooner if possible as she is in quite a bit of pain despite medications.  I am advancing her Norco to Percocet.  I have also placed her on gabapentin at bedtime.  We discussed starting with 300 mg at bedtime and if no significant relief after 3 days may increase to 2 capsules at bedtime.  Caution sedation.  Robaxin renewed.  She is predominantly using this only at nighttime but she does have extras if needed during the day.  She understands red flag signs and symptoms warranting further evaluation she will follow-up as needed - methocarbamol (ROBAXIN) 500 MG tablet; Take 1 tablet (500 mg total) by mouth 2 (two) times daily as needed for muscle spasms.  Dispense: 40 tablet; Refill: 0 - oxyCODONE-acetaminophen (PERCOCET) 5-325 MG tablet; Take 1 tablet by mouth every 6 (six)  hours as needed for severe pain.  Dispense: 20 tablet; Refill: 0 - gabapentin (NEURONTIN) 300 MG capsule; Take 1-2 capsules (300-600 mg total) by mouth at bedtime.  Dispense: 60 capsule; Refill: 0  2. Degenerative lumbar spinal stenosis - methocarbamol (ROBAXIN) 500 MG tablet; Take 1 tablet (500 mg total) by mouth 2 (two) times daily as needed for muscle spasms.  Dispense: 40 tablet; Refill: 0 - oxyCODONE-acetaminophen (PERCOCET) 5-325 MG tablet; Take 1 tablet by mouth every 6 (six) hours as needed for severe pain.  Dispense: 20 tablet; Refill: 0 - gabapentin (NEURONTIN) 300 MG capsule; Take 1-2 capsules (300-600 mg total) by mouth at bedtime.  Dispense: 60 capsule; Refill: 0   No orders of the defined types were placed in this encounter.  No orders of the defined types were placed in this encounter.  The Narcotic Database has been reviewed.  There were no red flags.    Carrie Norlander, DO [place] [phone]

## 2020-03-04 ENCOUNTER — Other Ambulatory Visit: Payer: Self-pay

## 2020-03-04 ENCOUNTER — Ambulatory Visit: Payer: BC Managed Care – PPO | Admitting: Physical Therapy

## 2020-03-04 DIAGNOSIS — M5441 Lumbago with sciatica, right side: Secondary | ICD-10-CM

## 2020-03-04 DIAGNOSIS — R293 Abnormal posture: Secondary | ICD-10-CM | POA: Diagnosis not present

## 2020-03-04 DIAGNOSIS — M5416 Radiculopathy, lumbar region: Secondary | ICD-10-CM | POA: Diagnosis not present

## 2020-03-04 NOTE — Therapy (Signed)
[facility] [place], [place], [address] Phone: [phone]   Fax:  [phone]  Physical Therapy Treatment  Patient Details  Name: Carrie [name] MRN: [id number] Date of Birth: 05-21-1961 Referring Provider (PT): Ronnie Doss DO.   Encounter Date: 03/04/2020   PT End of Session - 03/04/20 1723    Visit Number 2    Number of Visits 12    Date for PT Re-Evaluation 04/12/20    PT Start Time 0324    PT Stop Time 0401    PT Time Calculation (min) 37 min           Past Medical History:  Diagnosis Date  . Depression   . Diabetes mellitus without complication ([place])   . Fever blister   . Gastroparesis   . Hyperlipidemia   . Hypertension   . Migraine     Past Surgical History:  Procedure Laterality Date  . BACK SURGERY    . CHOLECYSTECTOMY  2004    There were no vitals filed for this visit.   Subjective Assessment - 03/04/20 1724    Subjective COVID-19 screen performed prior to patient entering clinic.  Increased pain after first treatment but better now.    Pertinent History DM, HTN, previous lumbar surgery.    How long can you sit comfortably? "Not very long."    Diagnostic tests MRI.    Patient Stated Goals Get out of pain.    Currently in Pain? Yes    Pain Score 3     Pain Location Back    Pain Orientation Right    Pain Descriptors / Indicators Aching;Sore    Pain Type Acute pain    Pain Onset 1 to 4 weeks ago                             [facility] Adult PT Treatment/Exercise - 03/04/20 0001      Modalities   Modalities Electrical Stimulation;Moist Heat;Ultrasound      Moist Heat Therapy   Number Minutes Moist Heat 15 Minutes    Moist Heat Location Lumbar Spine      Electrical Stimulation   Electrical Stimulation Location RT upper gluteal region.    Electrical Stimulation Action IFC    Electrical Stimulation Parameters 80-150 Hz x 15 minutes.      Ultrasound   Ultrasound  Location Patient prone over pillow for comfort:  Combo e'stim/US at 1.50 W/CM2 x 8 minutes to patient right upper gluteal region.      Manual Therapy   Manual Therapy Soft tissue mobilization    Soft tissue mobilization STW/M x 8 minutes to patient's right upper gluteal region to reduce tone.            Trigger Point Dry Needling - 03/04/20 0001    Consent Given? Yes    Education Handout Provided Yes    Muscles Treated Back/Hip Gluteus medius;Gluteus maximus    Gluteus Medius Response Twitch response elicited    Gluteus Maximus Response Twitch response elicited                     PT Long Term Goals - 03/01/20 1552      PT LONG TERM GOAL #1   Title Independent with a HEP.    Time 6    Period Weeks    Status New      PT LONG TERM GOAL #2   Title Sit  20 minutes with pain not > 2-3/10.    Time 6    Period Weeks    Status New      PT LONG TERM GOAL #3   Title Stand 20 minutes with pain not > 2-3/10.    Time 6    Period Weeks    Status New      PT LONG TERM GOAL #4   Title Eliminate right LE symptoms.    Time 6    Period Weeks    Status New                 Plan - 03/04/20 1728    Clinical Impression Statement Patient had an excellent response to dry needling to her right gluteal region and tolerated without complaint.    Personal Factors and Comorbidities Comorbidity 1;Comorbidity 2    Comorbidities DM, HTN, previous lumbar surgery.    Examination-Activity Limitations Locomotion Level;Sit;Stand    Stability/Clinical Decision Making Stable/Uncomplicated    Rehab Potential Good    PT Frequency 2x / week    PT Duration 6 weeks    PT Treatment/Interventions ADLs/Self Care Home Management;Cryotherapy;Electrical Stimulation;Ultrasound;Moist Heat;Therapeutic activities;Therapeutic exercise;Manual techniques;Patient/family education;Passive range of motion;Dry needling    PT Next Visit Plan FOTO...Marland KitchenMarland KitchenCore exercise progression, dry needling, combo  e'stim/US; STW/M, HMP and electrical stimulation    Consulted and Agree with Plan of Care Patient           Patient will benefit from skilled therapeutic intervention in order to improve the following deficits and impairments:  Pain, Postural dysfunction, Increased muscle spasms, Decreased activity tolerance, Decreased range of motion  Visit Diagnosis: Acute right-sided low back pain with right-sided sciatica  Abnormal posture     Problem List Patient Active Problem List   Diagnosis Date Noted  . Hyperlipidemia associated with type 2 diabetes mellitus ([place]) 01/23/2019  . Morbid obesity ([place]) 11/13/2017  . Type 1 diabetes mellitus without complications ([place]) 41/96/2229  . Hypertension associated with diabetes ([place]) 02/27/2017  . Tinnitus of both ears 02/26/2017  . Dizziness 02/26/2017  . Depression, major, single episode, in partial remission ([place]) 07/07/2016  . Migraine 07/07/2016  . Special screening for malignant neoplasms, colon 04/03/2016  . Irritable bowel syndrome (IBS) 04/03/2016    Mackinley Cassaday, [place] MPT 03/04/2020, 5:31 PM  [facility] [address] [place], [place], [address] Phone: [phone]   Fax:  [phone]  Name: TUESDAY TERLECKI MRN: [id number] Date of Birth: [birth year]

## 2020-03-08 ENCOUNTER — Ambulatory Visit: Payer: BC Managed Care – PPO | Attending: Family Medicine | Admitting: Physical Therapy

## 2020-03-08 DIAGNOSIS — R293 Abnormal posture: Secondary | ICD-10-CM | POA: Diagnosis not present

## 2020-03-08 DIAGNOSIS — M5441 Lumbago with sciatica, right side: Secondary | ICD-10-CM | POA: Diagnosis not present

## 2020-03-08 NOTE — Therapy (Signed)
[facility] [place], [place], [address] Phone: [phone]   Fax:  [phone]  Physical Therapy Treatment  Patient Details  Name: Carrie [name] MRN: [id number] Date of Birth: Nov 10, 1960 Referring Provider (PT): Ronnie Doss DO.   Encounter Date: 03/08/2020   PT End of Session - 03/08/20 1618    Visit Number 3    Number of Visits 12    Date for PT Re-Evaluation 04/12/20    PT Start Time 0315    PT Stop Time 0407    PT Time Calculation (min) 52 min           Past Medical History:  Diagnosis Date  . Depression   . Diabetes mellitus without complication ([place])   . Fever blister   . Gastroparesis   . Hyperlipidemia   . Hypertension   . Migraine     Past Surgical History:  Procedure Laterality Date  . BACK SURGERY    . CHOLECYSTECTOMY  2004    There were no vitals filed for this visit.   Subjective Assessment - 03/08/20 1617    Subjective COVID-19 screen performed prior to patient entering clinic.  Doing good with pain at a 2/10 today.  Hoping to avoid injections.    Pertinent History DM, HTN, previous lumbar surgery.    How long can you sit comfortably? "Not very long."    How long can you stand comfortably? "Not very long."    Diagnostic tests MRI.    Currently in Pain? Yes    Pain Location Buttocks    Pain Orientation Right    Pain Descriptors / Indicators Aching;Sore    Pain Type Acute pain    Pain Onset 1 to 4 weeks ago                             [facility] Adult PT Treatment/Exercise - 03/08/20 0001      Electrical Stimulation   Electrical Stimulation Location Right buttock/Piriformis region.    Electrical Stimulation Action IFC    Electrical Stimulation Parameters 80-150 Hz x 20 minutes.    Electrical Stimulation Goals Tone;Pain      Ultrasound   Ultrasound Location Left sdly position with pillow between knees for comfort:  Combo e'stim/US at 1.50 W/CM2 x 12 minutes to  patient's right Piriformis region.      Manual Therapy   Manual Therapy Soft tissue mobilization    Soft tissue mobilization STW/M x 12 minutes to patient's right Piriformis region to decrease tone.                       PT Long Term Goals - 03/01/20 1552      PT LONG TERM GOAL #1   Title Independent with a HEP.    Time 6    Period Weeks    Status New      PT LONG TERM GOAL #2   Title Sit 20 minutes with pain not > 2-3/10.    Time 6    Period Weeks    Status New      PT LONG TERM GOAL #3   Title Stand 20 minutes with pain not > 2-3/10.    Time 6    Period Weeks    Status New      PT LONG TERM GOAL #4   Title Eliminate right LE symptoms.    Time 6    Period Weeks  Status New                 Plan - 03/08/20 1623    Clinical Impression Statement The patient is doing well with treatments.  She had palpable pain over her right Piriformis today and treatment was focused on this region.  Patient responded well to treatment today and was provided with a Piriformis stretch to perform at home.    Personal Factors and Comorbidities Comorbidity 1;Comorbidity 2    Comorbidities DM, HTN, previous lumbar surgery.    Examination-Activity Limitations Locomotion Level;Sit;Stand    Examination-Participation Restrictions Other    Stability/Clinical Decision Making Stable/Uncomplicated    Rehab Potential Good    PT Frequency 2x / week    PT Duration 6 weeks    PT Treatment/Interventions ADLs/Self Care Home Management;Cryotherapy;Electrical Stimulation;Ultrasound;Moist Heat;Therapeutic activities;Therapeutic exercise;Manual techniques;Patient/family education;Passive range of motion;Dry needling    PT Next Visit Plan FOTO...Marland KitchenMarland KitchenCore exercise progression, dry needling, combo e'stim/US; STW/M, HMP and electrical stimulation    Consulted and Agree with Plan of Care Patient           Patient will benefit from skilled therapeutic intervention in order to improve the  following deficits and impairments:  Pain, Postural dysfunction, Increased muscle spasms, Decreased activity tolerance, Decreased range of motion  Visit Diagnosis: Acute right-sided low back pain with right-sided sciatica  Abnormal posture     Problem List Patient Active Problem List   Diagnosis Date Noted  . Hyperlipidemia associated with type 2 diabetes mellitus ([place]) 01/23/2019  . Morbid obesity ([place]) 11/13/2017  . Type 1 diabetes mellitus without complications ([place]) 89/16/9450  . Hypertension associated with diabetes ([place]) 02/27/2017  . Tinnitus of both ears 02/26/2017  . Dizziness 02/26/2017  . Depression, major, single episode, in partial remission ([place]) 07/07/2016  . Migraine 07/07/2016  . Special screening for malignant neoplasms, colon 04/03/2016  . Irritable bowel syndrome (IBS) 04/03/2016    Santino Kinsella, [place] MPT 03/08/2020, 4:25 PM  [facility] [address] [place], [place], [address] Phone: [phone]   Fax:  [phone]  Name: DEEPTI GUNAWAN MRN: [id number] Date of Birth: July 02, 1960

## 2020-03-11 ENCOUNTER — Other Ambulatory Visit: Payer: Self-pay

## 2020-03-11 ENCOUNTER — Ambulatory Visit: Payer: BC Managed Care – PPO | Admitting: Physical Therapy

## 2020-03-11 DIAGNOSIS — M5441 Lumbago with sciatica, right side: Secondary | ICD-10-CM

## 2020-03-11 DIAGNOSIS — R293 Abnormal posture: Secondary | ICD-10-CM | POA: Diagnosis not present

## 2020-03-11 NOTE — Therapy (Signed)
[facility] [place], [place], [address] Phone: [phone]   Fax:  [phone]  Physical Therapy Treatment  Patient Details  Name: Carrie [name] MRN: [id number] Date of Birth: 08/12/1960 Referring Provider (PT): Ronnie Doss DO.   Encounter Date: 03/11/2020   PT End of Session - 03/11/20 1707    Visit Number 4    Number of Visits 12    Date for PT Re-Evaluation 04/12/20    PT Start Time 0316    PT Stop Time 0414    PT Time Calculation (min) 58 min    Activity Tolerance Patient tolerated treatment well    Behavior During Therapy [facility] for tasks assessed/performed           Past Medical History:  Diagnosis Date  . Depression   . Diabetes mellitus without complication ([place])   . Fever blister   . Gastroparesis   . Hyperlipidemia   . Hypertension   . Migraine     Past Surgical History:  Procedure Laterality Date  . BACK SURGERY    . CHOLECYSTECTOMY  2004    There were no vitals filed for this visit.   Subjective Assessment - 03/11/20 1611    Subjective COVID-19 screen performed prior to patient entering clinic.  Pain very low today.    Pertinent History DM, HTN, previous lumbar surgery.    How long can you sit comfortably? "Not very long."    How long can you stand comfortably? "Not very long."    Diagnostic tests MRI.    Currently in Pain? Yes    Pain Score 1     Pain Location Buttocks    Pain Orientation Right    Pain Descriptors / Indicators Dull    Pain Type Acute pain                             [facility] Adult PT Treatment/Exercise - 03/11/20 0001      Exercises   Exercises Knee/Hip      Knee/Hip Exercises: Aerobic   Nustep Level 4 x 10 minutes.      Knee/Hip Exercises: Supine   Other Supine Knee/Hip Exercises 2 minutes sustained right Piriformis stretch.  Mini-crunches to fatigue, hip bridges to fatigue and SKTC stretch.        Modalities   Modalities Electrical  Stimulation;Moist Heat      Moist Heat Therapy   Number Minutes Moist Heat 20 Minutes    Moist Heat Location --   L-S/upper glut region.     Acupuncturist Location RT upper glut/Piriformis region.    Electrical Stimulation Action IFC    Electrical Stimulation Parameters 80-150 Hz x 20 minutes.    Electrical Stimulation Goals Tone;Pain      Manual Therapy   Manual Therapy Soft tissue mobilization    Soft tissue mobilization STW/M x 12 minutes to patient's right QL, upper glut and Piriformis.                       PT Long Term Goals - 03/01/20 1552      PT LONG TERM GOAL #1   Title Independent with a HEP.    Time 6    Period Weeks    Status New      PT LONG TERM GOAL #2   Title Sit 20 minutes with pain not > 2-3/10.    Time 6  Period Weeks    Status New      PT LONG TERM GOAL #3   Title Stand 20 minutes with pain not > 2-3/10.    Time 6    Period Weeks    Status New      PT LONG TERM GOAL #4   Title Eliminate right LE symptoms.    Time 6    Period Weeks    Status New                 Plan - 03/11/20 1729    Clinical Impression Statement The patient has been responding very well to treatments. Establshed a HEP.  Patient did very well with ther ex today.  Her pain was very low today.    Personal Factors and Comorbidities Comorbidity 1;Comorbidity 2    Comorbidities DM, HTN, previous lumbar surgery.    Examination-Activity Limitations Locomotion Level;Sit;Stand    Examination-Participation Restrictions Other    Stability/Clinical Decision Making Stable/Uncomplicated           Patient will benefit from skilled therapeutic intervention in order to improve the following deficits and impairments:  Pain, Postural dysfunction, Increased muscle spasms, Decreased activity tolerance, Decreased range of motion  Visit Diagnosis: Acute right-sided low back pain with right-sided sciatica  Abnormal  posture     Problem List Patient Active Problem List   Diagnosis Date Noted  . Hyperlipidemia associated with type 2 diabetes mellitus ([place]) 01/23/2019  . Morbid obesity ([place]) 11/13/2017  . Type 1 diabetes mellitus without complications ([place]) 75/91/6384  . Hypertension associated with diabetes ([place]) 02/27/2017  . Tinnitus of both ears 02/26/2017  . Dizziness 02/26/2017  . Depression, major, single episode, in partial remission ([place]) 07/07/2016  . Migraine 07/07/2016  . Special screening for malignant neoplasms, colon 04/03/2016  . Irritable bowel syndrome (IBS) 04/03/2016    Avrum Kimball, [place]  MPT 03/11/2020, 5:31 PM  [facility] [place], [place], [address] Phone: [phone]   Fax:  [phone]  Name: Carrie [name] MRN: [id number] Date of Birth: 1960/09/23

## 2020-03-16 ENCOUNTER — Other Ambulatory Visit: Payer: Self-pay

## 2020-03-16 ENCOUNTER — Ambulatory Visit: Payer: BC Managed Care – PPO | Admitting: Physical Therapy

## 2020-03-16 DIAGNOSIS — R293 Abnormal posture: Secondary | ICD-10-CM | POA: Diagnosis not present

## 2020-03-16 DIAGNOSIS — M5441 Lumbago with sciatica, right side: Secondary | ICD-10-CM | POA: Diagnosis not present

## 2020-03-16 NOTE — Therapy (Signed)
[facility] [place], [place], [address] Phone: [phone]   Fax:  [phone]  Physical Therapy Treatment  Patient Details  Name: Carrie [name] MRN: [id number] Date of Birth: 1960/09/25 Referring Provider (PT): Ronnie Doss DO.   Encounter Date: 03/16/2020   PT End of Session - 03/16/20 1615    Visit Number 5    Number of Visits 12    Date for PT Re-Evaluation 04/12/20    PT Start Time 0328    PT Stop Time 0428    PT Time Calculation (min) 60 min    Activity Tolerance Patient tolerated treatment well    Behavior During Therapy [facility] for tasks assessed/performed           Past Medical History:  Diagnosis Date   Depression    Diabetes mellitus without complication ([place])    Fever blister    Gastroparesis    Hyperlipidemia    Hypertension    Migraine     Past Surgical History:  Procedure Laterality Date   BACK SURGERY     CHOLECYSTECTOMY  2004    There were no vitals filed for this visit.   Subjective Assessment - 03/16/20 1608    Subjective COVID-19 screen performed prior to patient entering clinic.  Right leg feels kinda heavy but pain is low.    Pertinent History DM, HTN, previous lumbar surgery.    How long can you sit comfortably? "Not very long."    How long can you stand comfortably? "Not very long."    Diagnostic tests MRI.    Patient Stated Goals Get out of pain.    Currently in Pain? Yes    Pain Score 2     Pain Location Buttocks    Pain Orientation Right    Pain Descriptors / Indicators Dull    Pain Type Acute pain    Pain Onset 1 to 4 weeks ago                             [facility] Adult PT Treatment/Exercise - 03/16/20 0001      Modalities   Modalities Electrical Stimulation;Moist Heat;Ultrasound      Moist Heat Therapy   Number Minutes Moist Heat 20 Minutes    Moist Heat Location --   RT GLUTEAL REGION.     Civil engineer, contracting Location RT gluteal region.    Electrical Stimulation Action IFC    Electrical Stimulation Parameters 80-150 Hz x 20 minutes.    Electrical Stimulation Goals Tone;Pain      Ultrasound   Ultrasound Location RT gluteal region.    Ultrasound Parameters Combo e'stim/US at 1.50 W/CM2 x 12 minutes.    Ultrasound Goals Pain      Manual Therapy   Manual Therapy Soft tissue mobilization    Soft tissue mobilization STW/M x 14 minutes to right gluteal region and focus on Piriformis x 14 minutes.                       PT Long Term Goals - 03/01/20 1552      PT LONG TERM GOAL #1   Title Independent with a HEP.    Time 6    Period Weeks    Status New      PT LONG TERM GOAL #2   Title Sit 20 minutes with pain not > 2-3/10.    Time 6  Period Weeks    Status New      PT LONG TERM GOAL #3   Title Stand 20 minutes with pain not > 2-3/10.    Time 6    Period Weeks    Status New      PT LONG TERM GOAL #4   Title Eliminate right LE symptoms.    Time 6    Period Weeks    Status New                 Plan - 03/16/20 1613    Clinical Impression Statement Pain low today.  Pain seems to be localizing to right Piriformis.  Patient tolerated treatment without complaint.    Personal Factors and Comorbidities Comorbidity 1;Comorbidity 2    Comorbidities DM, HTN, previous lumbar surgery.    Examination-Activity Limitations Locomotion Level;Sit;Stand    Examination-Participation Restrictions Other    Stability/Clinical Decision Making Stable/Uncomplicated    Rehab Potential Good    PT Frequency 2x / week    PT Duration 6 weeks    PT Treatment/Interventions ADLs/Self Care Home Management;Cryotherapy;Electrical Stimulation;Ultrasound;Moist Heat;Therapeutic activities;Therapeutic exercise;Manual techniques;Patient/family education;Passive range of motion;Dry needling    PT Next Visit Plan FOTO...Marland KitchenMarland KitchenCore exercise progression, dry needling, combo e'stim/US; STW/M, HMP  and electrical stimulation    Consulted and Agree with Plan of Care Patient           Patient will benefit from skilled therapeutic intervention in order to improve the following deficits and impairments:  Pain, Postural dysfunction, Increased muscle spasms, Decreased activity tolerance, Decreased range of motion  Visit Diagnosis: Acute right-sided low back pain with right-sided sciatica  Abnormal posture     Problem List Patient Active Problem List   Diagnosis Date Noted   Hyperlipidemia associated with type 2 diabetes mellitus ([place]) 01/23/2019   Morbid obesity ([place]) 11/13/2017   Type 1 diabetes mellitus without complications ([place]) 25/85/2778   Hypertension associated with diabetes ([place]) 02/27/2017   Tinnitus of both ears 02/26/2017   Dizziness 02/26/2017   Depression, major, single episode, in partial remission ([place]) 07/07/2016   Migraine 07/07/2016   Special screening for malignant neoplasms, colon 04/03/2016   Irritable bowel syndrome (IBS) 04/03/2016    Carrie [name], [place] MPT 03/16/2020, 4:35 PM  [facility] [place], [place], [address] Phone: [phone]   Fax:  [phone]  Name: Carrie [name] MRN: [id number] Date of Birth: [birth year]

## 2020-03-18 ENCOUNTER — Ambulatory Visit: Payer: BC Managed Care – PPO | Admitting: Family Medicine

## 2020-03-18 ENCOUNTER — Encounter: Payer: Self-pay | Admitting: Family Medicine

## 2020-03-18 ENCOUNTER — Encounter: Payer: BC Managed Care – PPO | Admitting: Physical Therapy

## 2020-03-18 ENCOUNTER — Other Ambulatory Visit: Payer: Self-pay

## 2020-03-18 VITALS — BP 104/69 | HR 83 | Temp 96.7°F | Ht 66.0 in | Wt 185.0 lb

## 2020-03-18 DIAGNOSIS — E785 Hyperlipidemia, unspecified: Secondary | ICD-10-CM

## 2020-03-18 DIAGNOSIS — G43809 Other migraine, not intractable, without status migrainosus: Secondary | ICD-10-CM

## 2020-03-18 DIAGNOSIS — E1169 Type 2 diabetes mellitus with other specified complication: Secondary | ICD-10-CM | POA: Diagnosis not present

## 2020-03-18 DIAGNOSIS — R7989 Other specified abnormal findings of blood chemistry: Secondary | ICD-10-CM

## 2020-03-18 DIAGNOSIS — M5416 Radiculopathy, lumbar region: Secondary | ICD-10-CM | POA: Diagnosis not present

## 2020-03-18 LAB — CBC WITH DIFFERENTIAL/PLATELET
Basophils Absolute: 0.1 10*3/uL (ref 0.0–0.2)
Basos: 1 %
EOS (ABSOLUTE): 0.3 10*3/uL (ref 0.0–0.4)
Eos: 3 %
Hematocrit: 44.9 % (ref 34.0–46.6)
Hemoglobin: 14.8 g/dL (ref 11.1–15.9)
Immature Grans (Abs): 0 10*3/uL (ref 0.0–0.1)
Immature Granulocytes: 0 %
Lymphocytes Absolute: 2.4 10*3/uL (ref 0.7–3.1)
Lymphs: 27 %
MCH: 30.7 pg (ref 26.6–33.0)
MCHC: 33 g/dL (ref 31.5–35.7)
MCV: 93 fL (ref 79–97)
Monocytes Absolute: 0.8 10*3/uL (ref 0.1–0.9)
Monocytes: 9 %
Neutrophils Absolute: 5.3 10*3/uL (ref 1.4–7.0)
Neutrophils: 60 %
Platelets: 493 10*3/uL — ABNORMAL HIGH (ref 150–450)
RBC: 4.82 x10E6/uL (ref 3.77–5.28)
RDW: 12.1 % (ref 11.7–15.4)
WBC: 8.9 10*3/uL (ref 3.4–10.8)

## 2020-03-18 LAB — BAYER DCA HB A1C WAIVED: HB A1C (BAYER DCA - WAIVED): 6.8 % (ref <7.0)

## 2020-03-18 MED ORDER — NURTEC 75 MG PO TBDP: 1.0000 | ORAL_TABLET | Freq: Every day | ORAL | 0 refills | Status: DC | PRN

## 2020-03-18 NOTE — Progress Notes (Signed)
Subjective: CC: DM2, HLD PCP: [name], DO HDQ:QIWL Carrie [name] is a 59 y.o. female presenting to clinic today for:  1. Type 2 Diabetes w/ HLD and hypertension; back pain:  She reports compliance with Lantus nightly, Ozempic 1 mg subcu every week, Jardiance 25 mg daily.  She also takes Lipitor 10 mg daily and Micardis HCT 40-12.5 mg.  She has had steroids in the last couple of months for low back pain with sciatica.  Apparently she has a slipped disc.  She continues to work with physical therapy, dry needling.  This does seem to be helping but she continues to have quite a bit of numbness going down the right leg.  She has chronic numbness of the left hip from her previous back issue.  Last eye exam: Done Last foot exam: Needs Last A1c:  Lab Results  Component Value Date   HGBA1C 6.3 11/17/2019   Nephropathy screen indicated?:  On ARB Last flu, zoster and/or pneumovax: Shingles Immunization History  Administered Date(s) Administered   Influenza,inj,Quad PF,6+ Mos 07/07/2016, 03/28/2017, 04/02/2018, 04/07/2019   Influenza-Unspecified 04/17/2016   Moderna SARS-COVID-2 Vaccination 08/11/2019, 09/09/2019   Pneumococcal Polysaccharide-23 01/23/2019   Tdap 01/23/2019    ROS: Denies dizziness, LOC, polyuria, polydipsia, unintended weight loss/gain, foot ulcerations, numbness or tingling in extremities, shortness of breath or chest pain.   ROS: Per HPI  No Known Allergies Past Medical History:  Diagnosis Date   Depression    Diabetes mellitus without complication (HCC)    Fever blister    Gastroparesis    Hyperlipidemia    Hypertension    Migraine     Current Outpatient Medications:    atorvastatin (LIPITOR) 10 MG tablet, Take 1 tablet (10 mg total) by mouth daily., Disp: 90 tablet, Rfl: 3   empagliflozin (JARDIANCE) 25 MG TABS tablet, TAKE 1 TABLET BY MOUTH BEFORE BREAKFAST (Patient taking differently: Take 25 mg by mouth daily. ), Disp: 90 tablet,  Rfl: 3   gabapentin (NEURONTIN) 300 MG capsule, Take 1-2 capsules (300-600 mg total) by mouth at bedtime., Disp: 60 capsule, Rfl: 0   ibuprofen (ADVIL) 400 MG tablet, Take 400 mg by mouth every 6 (six) hours as needed for moderate pain., Disp: , Rfl:    Insulin Glargine (LANTUS SOLOSTAR) 100 UNIT/ML Solostar Pen, Inject 64-80 Units into the skin at bedtime., Disp: 30 mL, Rfl: 12   Insulin Pen Needle (RELION PEN NEEDLES) 32G X 4 MM MISC, USE TO INJECT MEDICATION ONCE DAILY Dx 10.9, Disp: 100 each, Rfl: 3   lidocaine (LIDODERM) 5 %, Place 1 patch onto the skin daily. Remove & Discard patch within 12 hours or as directed by MD, Disp: 30 patch, Rfl: 0   methocarbamol (ROBAXIN) 500 MG tablet, Take 1 tablet (500 mg total) by mouth 2 (two) times daily as needed for muscle spasms., Disp: 40 tablet, Rfl: 0   omeprazole (PRILOSEC) 40 MG capsule, Take 1 capsule (40 mg total) by mouth daily., Disp: 30 capsule, Rfl: 11   ondansetron (ZOFRAN ODT) 8 MG disintegrating tablet, Take 1 tablet (8 mg total) by mouth every 8 (eight) hours as needed for nausea or vomiting., Disp: 20 tablet, Rfl: 0   oxyCODONE-acetaminophen (PERCOCET) 5-325 MG tablet, Take 1 tablet by mouth every 6 (six) hours as needed for severe pain., Disp: 20 tablet, Rfl: 0   PARoxetine (PAXIL) 30 MG tablet, Take 1 tablet (30 mg total) by mouth daily., Disp: 90 tablet, Rfl: 2   Semaglutide, 1 MG/DOSE, (OZEMPIC, 1  MG/DOSE,) 2 MG/1.5ML SOPN, INJECT 1MG  SUBCUTANEOUSLY ONCE A WEEK (Patient taking differently: Inject 1 mg into the skin once a week. Sunday), Disp: 12 mL, Rfl: 3   SUMAtriptan (IMITREX) 100 MG tablet, TAKE 1 TABLET BY MOUTH EVERY 2 HOURS AS NEEDED FOR MIGRAINE HEADACHE (Patient taking differently: Take 100 mg by mouth every 2 (two) hours as needed for migraine or headache. ), Disp: 10 tablet, Rfl: 0   telmisartan-hydrochlorothiazide (MICARDIS HCT) 40-12.5 MG tablet, Take 0.5 tablets by mouth daily., Disp: 45 tablet, Rfl: 1 Social  History   Socioeconomic History   Marital status: Married    Spouse name: Not on file   Number of children: 2   Years of education: Not on file   Highest education level: Not on file  Occupational History   Occupation: customer service  Tobacco Use   Smoking status: Former Smoker    Packs/day: 0.50    Years: 40.00    Pack years: 20.00    Types: Cigarettes    Quit date: 2006    Years since quitting: 15.7   Smokeless tobacco: Never Used  Scientific laboratory technician Use: Never used  Substance and Sexual Activity   Alcohol use: Not Currently    Comment: occasional    Drug use: No   Sexual activity: Not on file  Other Topics Concern   Not on file  Social History Narrative   .      Social Determinants of Health   Financial Resource Strain:    Difficulty of Paying Living Expenses: Not on file  Food Insecurity:    Worried About Charity fundraiser in the Last Year: Not on file   [employer] of Food in the Last Year: Not on file  Transportation Needs:    Lack of Transportation (Medical): Not on file   Lack of Transportation (Non-Medical): Not on file  Physical Activity:    Days of Exercise per Week: Not on file   Minutes of Exercise per Session: Not on file  Stress:    Feeling of Stress : Not on file  Social Connections:    Frequency of Communication with Friends and Family: Not on file   Frequency of Social Gatherings with Friends and Family: Not on file   Attends Religious Services: Not on file   Active Member of Clubs or Organizations: Not on file   Attends Archivist Meetings: Not on file   Marital Status: Not on file  Intimate Partner Violence:    Fear of Current or Ex-Partner: Not on file   Emotionally Abused: Not on file   Physically Abused: Not on file   Sexually Abused: Not on file   Family History  Problem Relation Age of Onset   Ovarian cancer Mother    Mental illness Daughter    Migraines Daughter    Colon cancer Neg  Hx    Rectal cancer Neg Hx    Throat cancer Neg Hx     Objective: Office vital signs reviewed. BP 104/69    Pulse 83    Temp (!) 96.7 F (35.9 C)    Ht 5\' 6"  (1.676 m)    Wt 185 lb (83.9 kg)    SpO2 94%    BMI 29.86 kg/m   Physical Examination:  General: Awake, alert, well appearing, No acute distress Cardio: regular rate and rhythm, S1S2 heard, no murmurs appreciated Pulm: clear to auscultation bilaterally, no wheezes, rhonchi or rales; normal work of breathing on room air  Extremities: warm, well perfused, No edema, cyanosis or clubbing; +2 pulses bilaterally  Diabetic Foot Exam - Simple   Simple Foot Form Diabetic Foot exam was performed with the following findings: Yes 03/18/2020 12:00 PM  Visual Inspection No deformities, no ulcerations, no other skin breakdown bilaterally: Yes Sensation Testing See comments: Yes Pulse Check Posterior Tibialis and Dorsalis pulse intact bilaterally: Yes Comments Decreased monofilament sensation in the right foot and left great toe.     Assessment/ Plan: 59 y.o. female   1. Type 2 diabetes mellitus with other specified complication, without long-term current use of insulin (HCC) Continues to be controlled low back issue - Bayer DCA Hb A1c Waived  2. Hyperlipidemia associated with type 2 diabetes mellitus ([place]) Continue statin  3. Morbid obesity ([place]) 1lb weight loss since last visit  4. Elevated platelet count - CBC with Differential/Platelet  5. Lumbar nerve root impingement Recommend that she keep the appointment with her back specialist for injection therapy since she has ongoing numbness in the right leg  6. Other migraine without status migrainosus, not intractable Trial of Nurtec for migraine headaches. - Rimegepant Sulfate (NURTEC) 75 MG TBDP; Take 1 tablet by mouth daily as needed (migraine).  Dispense: 2 tablet; Refill: 0  No orders of the defined types were placed in this encounter.  No orders of the defined types  were placed in this encounter.    [name], DO [facility] [phone]

## 2020-03-23 ENCOUNTER — Ambulatory Visit: Payer: BC Managed Care – PPO | Admitting: Physical Therapy

## 2020-03-24 DIAGNOSIS — Z6829 Body mass index (BMI) 29.0-29.9, adult: Secondary | ICD-10-CM | POA: Diagnosis not present

## 2020-03-24 DIAGNOSIS — M5126 Other intervertebral disc displacement, lumbar region: Secondary | ICD-10-CM | POA: Diagnosis not present

## 2020-03-25 ENCOUNTER — Encounter: Payer: Self-pay | Admitting: Family Medicine

## 2020-03-25 ENCOUNTER — Encounter: Payer: BC Managed Care – PPO | Admitting: Physical Therapy

## 2020-03-30 ENCOUNTER — Other Ambulatory Visit: Payer: Self-pay | Admitting: Obstetrics and Gynecology

## 2020-03-30 DIAGNOSIS — Z1231 Encounter for screening mammogram for malignant neoplasm of breast: Secondary | ICD-10-CM

## 2020-04-01 ENCOUNTER — Ambulatory Visit: Payer: BC Managed Care – PPO | Admitting: Physical Therapy

## 2020-04-01 DIAGNOSIS — M5441 Lumbago with sciatica, right side: Secondary | ICD-10-CM | POA: Diagnosis not present

## 2020-04-01 DIAGNOSIS — R293 Abnormal posture: Secondary | ICD-10-CM | POA: Diagnosis not present

## 2020-04-01 NOTE — Therapy (Signed)
[facility] [place], [place], [address] Phone: [phone]   Fax:  [phone]  Physical Therapy Treatment  Patient Details  Name: Carrie [name] MRN: [id number] Date of Birth: Oct 10, 1960 Referring Provider (PT): Ronnie Doss DO.   Encounter Date: 04/01/2020   PT End of Session - 04/01/20 1716    Visit Number 6    Number of Visits 12    Date for PT Re-Evaluation 04/12/20    PT Start Time 0405    PT Stop Time 0506    PT Time Calculation (min) 61 min    Activity Tolerance Patient tolerated treatment well    Behavior During Therapy WFL for tasks assessed/performed           Past Medical History:  Diagnosis Date   Depression    Diabetes mellitus without complication ([place])    Fever blister    Gastroparesis    Hyperlipidemia    Hypertension    Migraine     Past Surgical History:  Procedure Laterality Date   BACK SURGERY     CHOLECYSTECTOMY  2004    There were no vitals filed for this visit.   Subjective Assessment - 04/01/20 1708    Subjective COVID-19 screen performed prior to patient entering clinic.  Patient got injection but doesn't feel it helped much.  Still getting symptoms down right LE.    Pertinent History DM, HTN, previous lumbar surgery.    How long can you sit comfortably? "Not very long."    How long can you stand comfortably? "Not very long."    Diagnostic tests MRI.    Patient Stated Goals Get out of pain.    Currently in Pain? Yes    Pain Score 2     Pain Location Buttocks    Pain Orientation Right    Pain Descriptors / Indicators Dull    Pain Type Acute pain    Pain Onset 1 to 4 weeks ago                             [facility] Adult PT Treatment/Exercise - 04/01/20 0001      Modalities   Modalities Electrical Stimulation;Moist Heat;Ultrasound      Moist Heat Therapy   Number Minutes Moist Heat 20 Minutes    Moist Heat Location --   Right glut.      Acupuncturist Location Right gluteal region.    Electrical Stimulation Action IFC    Electrical Stimulation Parameters 80-150 Hz x 20 minutes.    Electrical Stimulation Goals Tone;Pain      Ultrasound   Ultrasound Location Right gluteal region.    Ultrasound Parameters Combo e'stim/US at 1.50 W/CM2 x 12 minutes.      Manual Therapy   Manual Therapy Soft tissue mobilization    Soft tissue mobilization STW/M x 12 minutes to right gluteal region.                       PT Long Term Goals - 03/01/20 1552      PT LONG TERM GOAL #1   Title Independent with a HEP.    Time 6    Period Weeks    Status New      PT LONG TERM GOAL #2   Title Sit 20 minutes with pain not > 2-3/10.    Time 6    Period Weeks  Status New      PT LONG TERM GOAL #3   Title Stand 20 minutes with pain not > 2-3/10.    Time 6    Period Weeks    Status New      PT LONG TERM GOAL #4   Title Eliminate right LE symptoms.    Time 6    Period Weeks    Status New                 Plan - 04/01/20 1713    Clinical Impression Statement The received a lumbar injection but it didn't help much.  Continued symptoms into her right LE.  We discussed trying traction.  Will send cert for that.    Personal Factors and Comorbidities Comorbidity 1;Comorbidity 2    Comorbidities DM, HTN, previous lumbar surgery.    Examination-Activity Limitations Locomotion Level;Sit;Stand    Examination-Participation Restrictions Other    Stability/Clinical Decision Making Stable/Uncomplicated    Rehab Potential Good    PT Frequency 2x / week    PT Duration 6 weeks    PT Treatment/Interventions ADLs/Self Care Home Management;Cryotherapy;Electrical Stimulation;Ultrasound;Moist Heat;Therapeutic activities;Therapeutic exercise;Manual techniques;Patient/family education;Passive range of motion;Dry needling    PT Next Visit Plan FOTO...Marland KitchenMarland KitchenCore exercise progression, dry needling, combo  e'stim/US; STW/M, HMP and electrical stimulation    Consulted and Agree with Plan of Care Patient           Patient will benefit from skilled therapeutic intervention in order to improve the following deficits and impairments:  Pain, Postural dysfunction, Increased muscle spasms, Decreased activity tolerance, Decreased range of motion  Visit Diagnosis: Acute right-sided low back pain with right-sided sciatica - Plan: PT plan of care cert/re-cert  Abnormal posture - Plan: PT plan of care cert/re-cert     Problem List Patient Active Problem List   Diagnosis Date Noted   Hyperlipidemia associated with type 2 diabetes mellitus ([place]) 01/23/2019   Morbid obesity ([place]) 11/13/2017   Type 1 diabetes mellitus without complications ([place]) 24/46/2863   Hypertension associated with diabetes ([place]) 02/27/2017   Tinnitus of both ears 02/26/2017   Dizziness 02/26/2017   Depression, major, single episode, in partial remission ([place]) 07/07/2016   Migraine 07/07/2016   Special screening for malignant neoplasms, colon 04/03/2016   Irritable bowel syndrome (IBS) 04/03/2016    Echo Propp, [place] MPT 04/01/2020, 5:18 PM  [facility] [address] [place], [place], [address] Phone: [phone]   Fax:  [phone]  Name: Carrie [name] MRN: [id number] Date of Birth: 1961/02/04

## 2020-04-05 ENCOUNTER — Ambulatory Visit: Payer: BC Managed Care – PPO | Admitting: Family Medicine

## 2020-04-06 ENCOUNTER — Encounter: Payer: Self-pay | Admitting: Physical Therapy

## 2020-04-06 ENCOUNTER — Ambulatory Visit: Payer: BC Managed Care – PPO | Attending: Family Medicine | Admitting: Physical Therapy

## 2020-04-06 ENCOUNTER — Other Ambulatory Visit: Payer: Self-pay

## 2020-04-06 DIAGNOSIS — M5441 Lumbago with sciatica, right side: Secondary | ICD-10-CM | POA: Diagnosis not present

## 2020-04-06 DIAGNOSIS — R293 Abnormal posture: Secondary | ICD-10-CM | POA: Diagnosis not present

## 2020-04-06 NOTE — Therapy (Signed)
[facility] [place], [place], [address] Phone: [phone]   Fax:  [phone]  Physical Therapy Treatment  Patient Details  Name: Carrie [name] MRN: [id number] Date of Birth: [birth year] Referring Provider (PT): Ronnie Doss DO.   Encounter Date: 04/06/2020   PT End of Session - 04/06/20 1723    Visit Number 7    Number of Visits 12    Date for PT Re-Evaluation 04/12/20    PT Start Time 0403    PT Stop Time 0505    PT Time Calculation (min) 62 min    Activity Tolerance Patient tolerated treatment well    Behavior During Therapy [facility] for tasks assessed/performed           Past Medical History:  Diagnosis Date  . Depression   . Diabetes mellitus without complication ([place])   . Fever blister   . Gastroparesis   . Hyperlipidemia   . Hypertension   . Migraine     Past Surgical History:  Procedure Laterality Date  . BACK SURGERY    . CHOLECYSTECTOMY  2004    There were no vitals filed for this visit.   Subjective Assessment - 04/06/20 1703    Subjective COVID-19 screen performed prior to patient entering clinic.  Ready to try traction today.    Pertinent History DM, HTN, previous lumbar surgery.    How long can you sit comfortably? "Not very long."    How long can you stand comfortably? "Not very long."    Diagnostic tests MRI.    Patient Stated Goals Get out of pain.    Currently in Pain? Yes    Pain Score 2     Pain Location Buttocks    Pain Orientation Right    Pain Descriptors / Indicators Dull    Pain Type Acute pain    Pain Onset 1 to 4 weeks ago                             [facility] Adult PT Treatment/Exercise - 04/06/20 0001      Exercises   Exercises Knee/Hip      Knee/Hip Exercises: Aerobic   Nustep Level 4 x 12 minutes.      Modalities   Modalities Electrical Stimulation;Moist Heat;Traction      Moist Heat Therapy   Number Minutes Moist Heat 20 Minutes    Moist  Heat Location Lumbar Spine      Electrical Stimulation   Electrical Stimulation Location Rt low back/upper gluteal region.    Electrical Stimulation Action IFC    Electrical Stimulation Parameters 80-150 Hz x 20 minutes.    Electrical Stimulation Goals Tone;Pain      Traction   Type of Traction Lumbar    Min (lbs) 15    Max (lbs) 70    Hold Time 99    Rest Time 5    Time 15                       PT Long Term Goals - 03/01/20 1552      PT LONG TERM GOAL #1   Title Independent with a HEP.    Time 6    Period Weeks    Status New      PT LONG TERM GOAL #2   Title Sit 20 minutes with pain not > 2-3/10.    Time 6    Period Weeks  Status New      PT LONG TERM GOAL #3   Title Stand 20 minutes with pain not > 2-3/10.    Time 6    Period Weeks    Status New      PT LONG TERM GOAL #4   Title Eliminate right LE symptoms.    Time 6    Period Weeks    Status New                 Plan - 04/06/20 1724    Clinical Impression Statement Added intermittment lumbar traction today at 70# which patient tolerated without complaint.    Personal Factors and Comorbidities Comorbidity 1;Comorbidity 2    Comorbidities DM, HTN, previous lumbar surgery.    Examination-Activity Limitations Locomotion Level;Sit;Stand    Examination-Participation Restrictions Other    Stability/Clinical Decision Making Stable/Uncomplicated    Rehab Potential Good    PT Frequency 2x / week    PT Duration 6 weeks    PT Treatment/Interventions ADLs/Self Care Home Management;Cryotherapy;Electrical Stimulation;Ultrasound;Moist Heat;Therapeutic activities;Therapeutic exercise;Manual techniques;Patient/family education;Passive range of motion;Dry needling    PT Next Visit Plan FOTO...Marland KitchenMarland KitchenCore exercise progression, dry needling, combo e'stim/US; STW/M, HMP and electrical stimulation    Consulted and Agree with Plan of Care Patient           Patient will benefit from skilled therapeutic  intervention in order to improve the following deficits and impairments:  Pain, Postural dysfunction, Increased muscle spasms, Decreased activity tolerance, Decreased range of motion  Visit Diagnosis: Acute right-sided low back pain with right-sided sciatica  Abnormal posture     Problem List Patient Active Problem List   Diagnosis Date Noted  . Hyperlipidemia associated with type 2 diabetes mellitus ([place]) 01/23/2019  . Morbid obesity ([place]) 11/13/2017  . Type 1 diabetes mellitus without complications ([place]) 35/45/6256  . Hypertension associated with diabetes ([place]) 02/27/2017  . Tinnitus of both ears 02/26/2017  . Dizziness 02/26/2017  . Depression, major, single episode, in partial remission ([place]) 07/07/2016  . Migraine 07/07/2016  . Special screening for malignant neoplasms, colon 04/03/2016  . Irritable bowel syndrome (IBS) 04/03/2016    Carrie [name], Carrie [name] MPT 04/06/2020, 5:26 PM  [facility] [address] [place], [place], [address] Phone: [phone]   Fax:  [phone]  Name: Carrie [name] MRN: [id number] Date of Birth: 11-May-1961

## 2020-04-08 ENCOUNTER — Ambulatory Visit: Payer: BC Managed Care – PPO | Admitting: Physical Therapy

## 2020-04-08 ENCOUNTER — Other Ambulatory Visit: Payer: Self-pay

## 2020-04-08 DIAGNOSIS — M5441 Lumbago with sciatica, right side: Secondary | ICD-10-CM | POA: Diagnosis not present

## 2020-04-08 DIAGNOSIS — R293 Abnormal posture: Secondary | ICD-10-CM

## 2020-04-08 NOTE — Therapy (Signed)
[facility] [place], [place], [address] Phone: [phone]   Fax:  [phone]  Physical Therapy Treatment  Patient Details  Name: Carrie [name] MRN: [id number] Date of Birth: 06-23-1960 Referring Provider (PT): Ronnie Doss DO.   Encounter Date: 04/08/2020   PT End of Session - 04/08/20 1544    Visit Number 8    Number of Visits 12    Date for PT Re-Evaluation 04/12/20    PT Start Time 0325    PT Stop Time 0414    PT Time Calculation (min) 49 min           Past Medical History:  Diagnosis Date  . Depression   . Diabetes mellitus without complication ([place])   . Fever blister   . Gastroparesis   . Hyperlipidemia   . Hypertension   . Migraine     Past Surgical History:  Procedure Laterality Date  . BACK SURGERY    . CHOLECYSTECTOMY  2004    There were no vitals filed for this visit.   Subjective Assessment - 04/08/20 1545    Subjective COVID-19 screen performed prior to patient entering clinic.  Enjoyed traction.    Pertinent History DM, HTN, previous lumbar surgery.    How long can you sit comfortably? "Not very long."    How long can you stand comfortably? "Not very long."    Diagnostic tests MRI.    Currently in Pain? Yes    Pain Score 2     Pain Location Back    Pain Orientation Right    Pain Descriptors / Indicators Dull    Pain Type Acute pain    Pain Onset 1 to 4 weeks ago                             [facility] Adult PT Treatment/Exercise - 04/08/20 0001      Modalities   Modalities Electrical Stimulation;Moist Heat      Moist Heat Therapy   Number Minutes Moist Heat 20 Minutes    Moist Heat Location Lumbar Spine      Electrical Stimulation   Electrical Stimulation Location RT LB    Electrical Stimulation Action IFC    Electrical Stimulation Parameters 80-150 Hz x 20 minutes.    Electrical Stimulation Goals Tone;Pain      Traction   Type of Traction Lumbar    Min  (lbs) 15    Max (lbs) 80    Hold Time 99    Rest Time 5    Time 15                       PT Long Term Goals - 03/01/20 1552      PT LONG TERM GOAL #1   Title Independent with a HEP.    Time 6    Period Weeks    Status New      PT LONG TERM GOAL #2   Title Sit 20 minutes with pain not > 2-3/10.    Time 6    Period Weeks    Status New      PT LONG TERM GOAL #3   Title Stand 20 minutes with pain not > 2-3/10.    Time 6    Period Weeks    Status New      PT LONG TERM GOAL #4   Title Eliminate right LE symptoms.  Time 6    Period Weeks    Status New                 Plan - 04/08/20 1710    Clinical Impression Statement The patient did well with a 10# increase in intermittment lumbar traction today.    Personal Factors and Comorbidities Comorbidity 1;Comorbidity 2    Comorbidities DM, HTN, previous lumbar surgery.    Examination-Activity Limitations Locomotion Level;Sit;Stand    Examination-Participation Restrictions Other    Stability/Clinical Decision Making Stable/Uncomplicated    Rehab Potential Good    PT Frequency 2x / week    PT Duration 6 weeks    PT Treatment/Interventions ADLs/Self Care Home Management;Cryotherapy;Electrical Stimulation;Ultrasound;Moist Heat;Therapeutic activities;Therapeutic exercise;Manual techniques;Patient/family education;Passive range of motion;Dry needling    PT Next Visit Plan FOTO...Marland KitchenMarland KitchenCore exercise progression, dry needling, combo e'stim/US; STW/M, HMP and electrical stimulation    Consulted and Agree with Plan of Care Patient           Patient will benefit from skilled therapeutic intervention in order to improve the following deficits and impairments:  Pain, Postural dysfunction, Increased muscle spasms, Decreased activity tolerance, Decreased range of motion  Visit Diagnosis: Acute right-sided low back pain with right-sided sciatica  Abnormal posture     Problem List Patient Active Problem List    Diagnosis Date Noted  . Hyperlipidemia associated with type 2 diabetes mellitus ([place]) 01/23/2019  . Morbid obesity ([place]) 11/13/2017  . Type 1 diabetes mellitus without complications ([place]) 50/38/8828  . Hypertension associated with diabetes ([place]) 02/27/2017  . Tinnitus of both ears 02/26/2017  . Dizziness 02/26/2017  . Depression, major, single episode, in partial remission ([place]) 07/07/2016  . Migraine 07/07/2016  . Special screening for malignant neoplasms, colon 04/03/2016  . Irritable bowel syndrome (IBS) 04/03/2016    Eric Morganti, [place] MPT 04/08/2020, 5:17 PM  [facility] [address] [place], [place], [address] Phone: [phone]   Fax:  [phone]  Name: Carrie [name] MRN: [id number] Date of Birth: 10/10/1960

## 2020-04-13 ENCOUNTER — Ambulatory Visit: Payer: BC Managed Care – PPO | Admitting: Physical Therapy

## 2020-04-13 ENCOUNTER — Other Ambulatory Visit: Payer: Self-pay

## 2020-04-13 DIAGNOSIS — R293 Abnormal posture: Secondary | ICD-10-CM | POA: Diagnosis not present

## 2020-04-13 DIAGNOSIS — M5441 Lumbago with sciatica, right side: Secondary | ICD-10-CM | POA: Diagnosis not present

## 2020-04-13 NOTE — Therapy (Signed)
[facility] [place], [place], [address] Phone: [phone]   Fax:  [phone]  Physical Therapy Treatment  Patient Details  Name: Carrie [name] MRN: [id number] Date of Birth: November 22, 1960 Referring Provider (PT): Ronnie Doss DO.   Encounter Date: 04/13/2020   PT End of Session - 04/13/20 1728    Visit Number 9    Number of Visits 12    Date for PT Re-Evaluation 04/12/20    PT Start Time 0405    PT Stop Time 0511    PT Time Calculation (min) 66 min    Activity Tolerance Patient tolerated treatment well    Behavior During Therapy WFL for tasks assessed/performed           Past Medical History:  Diagnosis Date   Depression    Diabetes mellitus without complication ([place])    Fever blister    Gastroparesis    Hyperlipidemia    Hypertension    Migraine     Past Surgical History:  Procedure Laterality Date   BACK SURGERY     CHOLECYSTECTOMY  2004    There were no vitals filed for this visit.   Subjective Assessment - 04/13/20 1701    Subjective COVID-19 screen performed prior to patient entering clinic.  Patient is responding well to traction.    Pertinent History DM, HTN, previous lumbar surgery.    How long can you sit comfortably? "Not very long."    Diagnostic tests MRI.    Patient Stated Goals Get out of pain.    Currently in Pain? Yes    Pain Score 2     Pain Location Back    Pain Orientation Right    Pain Descriptors / Indicators Dull    Pain Type Acute pain    Pain Onset 1 to 4 weeks ago                             [facility] Adult PT Treatment/Exercise - 04/13/20 0001      Exercises   Exercises Lumbar;Knee/Hip      Lumbar Exercises: Machines for Strengthening   Cybex Lumbar Extension 60# x 4 minutes.      Knee/Hip Exercises: Aerobic   Nustep Level 4 x 5 minutes.      Moist Heat Therapy   Number Minutes Moist Heat 20 Minutes    Moist Heat Location Lumbar Spine       Electrical Stimulation   Electrical Stimulation Location RT LB/upper gluts.    Electrical Stimulation Action IFC    Electrical Stimulation Parameters 80-150 Hz x 20 minutes.    Electrical Stimulation Goals Tone;Pain      Traction   Type of Traction Lumbar    Min (lbs) 15    Max (lbs) 90    Hold Time 99    Rest Time 5    Time 15                       PT Long Term Goals - 03/01/20 1552      PT LONG TERM GOAL #1   Title Independent with a HEP.    Time 6    Period Weeks    Status New      PT LONG TERM GOAL #2   Title Sit 20 minutes with pain not > 2-3/10.    Time 6    Period Weeks    Status New  PT LONG TERM GOAL #3   Title Stand 20 minutes with pain not > 2-3/10.    Time 6    Period Weeks    Status New      PT LONG TERM GOAL #4   Title Eliminate right LE symptoms.    Time 6    Period Weeks    Status New                 Plan - 04/13/20 1707    Clinical Impression Statement Patient feels traction treatments are helping and tolerated a 10# increase in traction today without complaint.    Personal Factors and Comorbidities Comorbidity 1;Comorbidity 2    Comorbidities DM, HTN, previous lumbar surgery.    Examination-Activity Limitations Locomotion Level;Sit;Stand    Examination-Participation Restrictions Other    Stability/Clinical Decision Making Stable/Uncomplicated    Rehab Potential Good    PT Frequency 2x / week    PT Duration 6 weeks    PT Treatment/Interventions ADLs/Self Care Home Management;Cryotherapy;Electrical Stimulation;Ultrasound;Moist Heat;Therapeutic activities;Therapeutic exercise;Manual techniques;Patient/family education;Passive range of motion;Dry needling    PT Next Visit Plan FOTO...Marland KitchenMarland KitchenCore exercise progression, dry needling, combo e'stim/US; STW/M, HMP and electrical stimulation    Consulted and Agree with Plan of Care Patient           Patient will benefit from skilled therapeutic intervention in order to  improve the following deficits and impairments:  Pain, Postural dysfunction, Increased muscle spasms, Decreased activity tolerance, Decreased range of motion  Visit Diagnosis: Acute right-sided low back pain with right-sided sciatica  Abnormal posture     Problem List Patient Active Problem List   Diagnosis Date Noted   Hyperlipidemia associated with type 2 diabetes mellitus ([place]) 01/23/2019   Morbid obesity ([place]) 11/13/2017   Type 1 diabetes mellitus without complications ([place]) 32/67/1245   Hypertension associated with diabetes ([place]) 02/27/2017   Tinnitus of both ears 02/26/2017   Dizziness 02/26/2017   Depression, major, single episode, in partial remission ([place]) 07/07/2016   Migraine 07/07/2016   Special screening for malignant neoplasms, colon 04/03/2016   Irritable bowel syndrome (IBS) 04/03/2016    Anthoni Geerts, [place] MPT 04/13/2020, 5:31 PM  [facility] [place], [place], [address] Phone: [phone]   Fax:  [phone]  Name: Carrie [name] MRN: [id number] Date of Birth: 10/04/1960

## 2020-04-15 ENCOUNTER — Ambulatory Visit: Payer: BC Managed Care – PPO | Admitting: Physical Therapy

## 2020-04-15 DIAGNOSIS — M5126 Other intervertebral disc displacement, lumbar region: Secondary | ICD-10-CM | POA: Diagnosis not present

## 2020-04-20 ENCOUNTER — Ambulatory Visit: Payer: BC Managed Care – PPO | Admitting: Physical Therapy

## 2020-04-20 ENCOUNTER — Other Ambulatory Visit: Payer: Self-pay

## 2020-04-20 DIAGNOSIS — M5441 Lumbago with sciatica, right side: Secondary | ICD-10-CM

## 2020-04-20 DIAGNOSIS — R293 Abnormal posture: Secondary | ICD-10-CM

## 2020-04-20 NOTE — Therapy (Signed)
[facility] [place], [place], [address] Phone: [phone]   Fax:  [phone]  Physical Therapy Treatment  Patient Details  Name: MARICEL SWARTZENDRUBER MRN: [id number] Date of Birth: [birth year] Referring Provider (PT): Ronnie Doss DO.   Encounter Date: 04/20/2020   PT End of Session - 04/20/20 1711    Visit Number 10    Number of Visits 12    Date for PT Re-Evaluation 05/03/20    PT Start Time 0403    PT Stop Time 0508    PT Time Calculation (min) 65 min    Activity Tolerance Patient tolerated treatment well    Behavior During Therapy [facility] for tasks assessed/performed           Past Medical History:  Diagnosis Date  . Depression   . Diabetes mellitus without complication ([place])   . Fever blister   . Gastroparesis   . Hyperlipidemia   . Hypertension   . Migraine     Past Surgical History:  Procedure Laterality Date  . BACK SURGERY    . CHOLECYSTECTOMY  2004    There were no vitals filed for this visit.   Subjective Assessment - 04/20/20 1631    Subjective COVID-19 screen performed prior to patient entering clinic.  Some increase in back pain but leg is better.    Pertinent History DM, HTN, previous lumbar surgery.    How long can you sit comfortably? "Not very long."    How long can you stand comfortably? "Not very long."    Diagnostic tests MRI.    Patient Stated Goals Get out of pain.    Currently in Pain? Yes    Pain Score 4     Pain Location Back    Pain Orientation Right    Pain Descriptors / Indicators Dull;Aching    Pain Type Acute pain    Pain Onset More than a month ago    Pain Frequency Constant                             [facility] Adult PT Treatment/Exercise - 04/20/20 0001      Exercises   Exercises Knee/Hip      Lumbar Exercises: Aerobic   Nustep Level x 6 minutes.      Lumbar Exercises: Machines for Strengthening   Cybex Lumbar Extension 60# x 4 minutes.       Modalities   Modalities Electrical Stimulation;Moist Heat      Moist Heat Therapy   Number Minutes Moist Heat 20 Minutes    Moist Heat Location Lumbar Spine      Electrical Stimulation   Electrical Stimulation Location RT LB/upper gluteal.    Electrical Stimulation Action IFC    Electrical Stimulation Parameters 80-150 Hz x 20 minutes.    Electrical Stimulation Goals Tone;Pain      Traction   Type of Traction Lumbar    Min (lbs) 15    Max (lbs) 90    Hold Time 99    Rest Time 5    Time 15                       PT Long Term Goals - 03/01/20 1552      PT LONG TERM GOAL #1   Title Independent with a HEP.    Time 6    Period Weeks    Status New  PT LONG TERM GOAL #2   Title Sit 20 minutes with pain not > 2-3/10.    Time 6    Period Weeks    Status New      PT LONG TERM GOAL #3   Title Stand 20 minutes with pain not > 2-3/10.    Time 6    Period Weeks    Status New      PT LONG TERM GOAL #4   Title Eliminate right LE symptoms.    Time 6    Period Weeks    Status New                 Plan - 04/20/20 1708    Clinical Impression Statement Patient has been doing very well with traction.  She had a bit of an increase in her right sided low back pain but her LE symptoms were improved.    Personal Factors and Comorbidities Comorbidity 1;Comorbidity 2    Comorbidities DM, HTN, previous lumbar surgery.    Examination-Activity Limitations Locomotion Level;Sit;Stand    Examination-Participation Restrictions Other    Stability/Clinical Decision Making Stable/Uncomplicated    Rehab Potential Good    PT Frequency 2x / week    PT Duration 6 weeks    PT Treatment/Interventions ADLs/Self Care Home Management;Cryotherapy;Electrical Stimulation;Ultrasound;Moist Heat;Therapeutic activities;Therapeutic exercise;Manual techniques;Patient/family education;Passive range of motion;Dry needling    PT Next Visit Plan FOTO...Marland KitchenMarland KitchenCore exercise progression, dry  needling, combo e'stim/US; STW/M, HMP and electrical stimulation    Consulted and Agree with Plan of Care Patient           Patient will benefit from skilled therapeutic intervention in order to improve the following deficits and impairments:  Pain, Postural dysfunction, Increased muscle spasms, Decreased activity tolerance, Decreased range of motion  Visit Diagnosis: Acute right-sided low back pain with right-sided sciatica - Plan: PT plan of care cert/re-cert  Abnormal posture - Plan: PT plan of care cert/re-cert     Problem List Patient Active Problem List   Diagnosis Date Noted  . Hyperlipidemia associated with type 2 diabetes mellitus ([place]) 01/23/2019  . Morbid obesity ([place]) 11/13/2017  . Type 1 diabetes mellitus without complications ([place]) 38/32/9191  . Hypertension associated with diabetes ([place]) 02/27/2017  . Tinnitus of both ears 02/26/2017  . Dizziness 02/26/2017  . Depression, major, single episode, in partial remission ([place]) 07/07/2016  . Migraine 07/07/2016  . Special screening for malignant neoplasms, colon 04/03/2016  . Irritable bowel syndrome (IBS) 04/03/2016    Progress Note Reporting Period 03/01/20 to 04/20/20  See note below for Objective Data and Assessment of Progress/Goals. Patient has noted an improvement in her right LE symptoms since beginning intermittent lumbar traction.      Moustafa Mossa, [place] MPT 04/20/2020, 5:15 PM  [facility] [address] [place], [place], [address] Phone: [phone]   Fax:  [phone]  Name: JESSALYN HINOJOSA MRN: [id number] Date of Birth: May 04, 1961

## 2020-04-22 ENCOUNTER — Encounter: Payer: BC Managed Care – PPO | Admitting: Physical Therapy

## 2020-04-27 ENCOUNTER — Ambulatory Visit: Payer: BC Managed Care – PPO | Admitting: Physical Therapy

## 2020-04-28 ENCOUNTER — Other Ambulatory Visit: Payer: Self-pay

## 2020-04-28 ENCOUNTER — Ambulatory Visit
Admission: RE | Admit: 2020-04-28 | Discharge: 2020-04-28 | Disposition: A | Discharge status: Home / Self Care | Payer: BC Managed Care – PPO | Source: Ambulatory Visit | Attending: Obstetrics and Gynecology | Admitting: Obstetrics and Gynecology

## 2020-04-28 DIAGNOSIS — Z130 Encounter for screening for diseases of the blood and blood-forming organs and certain disorders involving the immune mechanism: Secondary | ICD-10-CM | POA: Diagnosis not present

## 2020-04-28 DIAGNOSIS — Z01419 Encounter for gynecological examination (general) (routine) without abnormal findings: Secondary | ICD-10-CM | POA: Diagnosis not present

## 2020-04-28 DIAGNOSIS — Z124 Encounter for screening for malignant neoplasm of cervix: Secondary | ICD-10-CM | POA: Diagnosis not present

## 2020-04-28 DIAGNOSIS — Z1389 Encounter for screening for other disorder: Secondary | ICD-10-CM | POA: Diagnosis not present

## 2020-04-28 DIAGNOSIS — E669 Obesity, unspecified: Secondary | ICD-10-CM | POA: Diagnosis not present

## 2020-04-28 DIAGNOSIS — Z1231 Encounter for screening mammogram for malignant neoplasm of breast: Secondary | ICD-10-CM

## 2020-05-04 ENCOUNTER — Ambulatory Visit: Payer: BC Managed Care – PPO | Admitting: Physical Therapy

## 2020-05-04 ENCOUNTER — Other Ambulatory Visit: Payer: Self-pay

## 2020-05-04 DIAGNOSIS — R293 Abnormal posture: Secondary | ICD-10-CM | POA: Diagnosis not present

## 2020-05-04 DIAGNOSIS — M5441 Lumbago with sciatica, right side: Secondary | ICD-10-CM | POA: Diagnosis not present

## 2020-05-04 NOTE — Therapy (Addendum)
[facility] [place], [place], [address] Phone: [phone]   Fax:  [phone]  Physical Therapy Treatment  Patient Details  Name: Carrie [name] MRN: [id number] Date of Birth: 10-23-1960 Referring Provider (PT): Ronnie Doss DO.   Encounter Date: 05/04/2020   PT End of Session - 05/04/20 1720    Visit Number 11    Number of Visits 12    Date for PT Re-Evaluation 05/03/20    PT Start Time 0403    PT Stop Time 0453    PT Time Calculation (min) 50 min    Activity Tolerance Patient tolerated treatment well    Behavior During Therapy [facility] for tasks assessed/performed           Past Medical History:  Diagnosis Date  . Depression   . Diabetes mellitus without complication ([place])   . Fever blister   . Gastroparesis   . Hyperlipidemia   . Hypertension   . Migraine     Past Surgical History:  Procedure Laterality Date  . BACK SURGERY    . CHOLECYSTECTOMY  2004    There were no vitals filed for this visit.   Subjective Assessment - 05/04/20 1650    Subjective COVID-19 screen performed prior to patient entering clinic.  Doing great.    Pertinent History DM, HTN, previous lumbar surgery.    How long can you sit comfortably? "Not very long."    How long can you stand comfortably? "Not very long."    Diagnostic tests MRI.    Patient Stated Goals Get out of pain.    Currently in Pain? Yes    Pain Score 1     Pain Location Back    Pain Orientation Right    Pain Descriptors / Indicators Dull;Aching    Pain Type Acute pain    Pain Onset More than a month ago                             [facility] Adult PT Treatment/Exercise - 05/04/20 0001      Modalities   Modalities Electrical Stimulation;Moist Heat;Traction      Moist Heat Therapy   Number Minutes Moist Heat 20 Minutes    Moist Heat Location Lumbar Spine      Electrical Stimulation   Electrical Stimulation Location RT LB    Electrical  Stimulation Action Pre-mod.      Traction   Type of Traction Lumbar    Min (lbs) 15    Max (lbs) 90    Hold Time 99    Rest Time 5    Time 15                       PT Long Term Goals - 05/04/20 1648      PT LONG TERM GOAL #1   Title Independent with a HEP.    Time 6    Period Weeks    Status Achieved      PT LONG TERM GOAL #2   Title Sit 20 minutes with pain not > 2-3/10.    Time 6    Period Weeks    Status Achieved      PT LONG TERM GOAL #3   Title Stand 20 minutes with pain not > 2-3/10.    Time 6    Period Weeks    Status Achieved  Plan - 05/04/20 1724    Clinical Impression Statement See "Therapy Note" section.    Personal Factors and Comorbidities Comorbidity 1;Comorbidity 2    Comorbidities DM, HTN, previous lumbar surgery.    Examination-Activity Limitations Locomotion Level;Sit;Stand    Examination-Participation Restrictions Other    Stability/Clinical Decision Making Stable/Uncomplicated    Rehab Potential Good    PT Frequency 2x / week    PT Duration 6 weeks    PT Treatment/Interventions ADLs/Self Care Home Management;Cryotherapy;Electrical Stimulation;Ultrasound;Moist Heat;Therapeutic activities;Therapeutic exercise;Manual techniques;Patient/family education;Passive range of motion;Dry needling    PT Next Visit Plan FOTO...Marland KitchenMarland KitchenCore exercise progression, dry needling, combo e'stim/US; STW/M, HMP and electrical stimulation    Consulted and Agree with Plan of Care Patient           Patient will benefit from skilled therapeutic intervention in order to improve the following deficits and impairments:  Pain, Postural dysfunction, Increased muscle spasms, Decreased activity tolerance, Decreased range of motion  Visit Diagnosis: Acute right-sided low back pain with right-sided sciatica  Abnormal posture     Problem List Patient Active Problem List   Diagnosis Date Noted  . Hyperlipidemia associated with type 2  diabetes mellitus ([place]) 01/23/2019  . Morbid obesity ([place]) 11/13/2017  . Type 1 diabetes mellitus without complications ([place]) 86/76/7209  . Hypertension associated with diabetes ([place]) 02/27/2017  . Tinnitus of both ears 02/26/2017  . Dizziness 02/26/2017  . Depression, major, single episode, in partial remission ([place]) 07/07/2016  . Migraine 07/07/2016  . Special screening for malignant neoplasms, colon 04/03/2016  . Irritable bowel syndrome (IBS) 04/03/2016   PHYSICAL THERAPY DISCHARGE SUMMARY  Visits from Start of Care: 11.  Current functional level related to goals / functional outcomes: See above.   Remaining deficits: All goals met.   Education / Equipment: HEP. Plan: Patient agrees to discharge.  Patient goals were met. Patient is being discharged due to meeting the stated rehab goals.  ?????       Kainan Patty, [place] MPT 05/04/2020, 5:24 PM  [facility] [address] [place], [place], [address] Phone: [phone]   Fax:  [phone]  Name: Carrie [name] MRN: [id number] Date of Birth: 1961/04/16

## 2020-05-06 ENCOUNTER — Other Ambulatory Visit: Payer: Self-pay | Admitting: Obstetrics and Gynecology

## 2020-05-06 DIAGNOSIS — N6489 Other specified disorders of breast: Secondary | ICD-10-CM

## 2020-05-25 ENCOUNTER — Other Ambulatory Visit: Payer: Self-pay

## 2020-05-25 ENCOUNTER — Ambulatory Visit
Admission: RE | Admit: 2020-05-25 | Discharge: 2020-05-25 | Disposition: A | Discharge status: Home / Self Care | Payer: BC Managed Care – PPO | Source: Ambulatory Visit | Attending: Obstetrics and Gynecology | Admitting: Obstetrics and Gynecology

## 2020-05-25 ENCOUNTER — Ambulatory Visit: Payer: BC Managed Care – PPO

## 2020-05-25 DIAGNOSIS — N6489 Other specified disorders of breast: Secondary | ICD-10-CM

## 2020-06-27 ENCOUNTER — Other Ambulatory Visit: Payer: Self-pay | Admitting: Family Medicine

## 2020-06-27 DIAGNOSIS — I152 Hypertension secondary to endocrine disorders: Secondary | ICD-10-CM

## 2020-06-27 DIAGNOSIS — E1159 Type 2 diabetes mellitus with other circulatory complications: Secondary | ICD-10-CM

## 2020-06-28 ENCOUNTER — Other Ambulatory Visit: Payer: Self-pay | Admitting: Family Medicine

## 2020-06-28 DIAGNOSIS — E1159 Type 2 diabetes mellitus with other circulatory complications: Secondary | ICD-10-CM

## 2020-06-28 DIAGNOSIS — I152 Hypertension secondary to endocrine disorders: Secondary | ICD-10-CM

## 2020-07-19 ENCOUNTER — Ambulatory Visit (INDEPENDENT_AMBULATORY_CARE_PROVIDER_SITE_OTHER): Payer: BC Managed Care – PPO

## 2020-07-19 ENCOUNTER — Ambulatory Visit: Payer: BC Managed Care – PPO | Admitting: Family Medicine

## 2020-07-19 ENCOUNTER — Telehealth: Payer: Self-pay | Admitting: *Deleted

## 2020-07-19 ENCOUNTER — Encounter: Payer: Self-pay | Admitting: Family Medicine

## 2020-07-19 ENCOUNTER — Other Ambulatory Visit: Payer: Self-pay

## 2020-07-19 VITALS — BP 108/80 | HR 91 | Temp 97.5°F | Ht 66.0 in | Wt 191.0 lb

## 2020-07-19 DIAGNOSIS — I152 Hypertension secondary to endocrine disorders: Secondary | ICD-10-CM

## 2020-07-19 DIAGNOSIS — D75839 Thrombocytosis, unspecified: Secondary | ICD-10-CM | POA: Diagnosis not present

## 2020-07-19 DIAGNOSIS — E1159 Type 2 diabetes mellitus with other circulatory complications: Secondary | ICD-10-CM | POA: Diagnosis not present

## 2020-07-19 DIAGNOSIS — M533 Sacrococcygeal disorders, not elsewhere classified: Secondary | ICD-10-CM | POA: Diagnosis not present

## 2020-07-19 DIAGNOSIS — E1169 Type 2 diabetes mellitus with other specified complication: Secondary | ICD-10-CM

## 2020-07-19 DIAGNOSIS — G43809 Other migraine, not intractable, without status migrainosus: Secondary | ICD-10-CM

## 2020-07-19 DIAGNOSIS — E785 Hyperlipidemia, unspecified: Secondary | ICD-10-CM

## 2020-07-19 MED ORDER — INSULIN GLARGINE 100 UNIT/ML ~~LOC~~ SOLN
70.0000 [IU] | Freq: Every day | SUBCUTANEOUS | 99 refills | Status: DC
Start: 2020-07-19 — End: 2020-10-05

## 2020-07-19 MED ORDER — NURTEC 75 MG PO TBDP: 1.0000 | ORAL_TABLET | Freq: Every day | ORAL | 99 refills | Status: DC | PRN

## 2020-07-19 NOTE — Telephone Encounter (Signed)
(  Key: [id number]) [employer] [id number] pen-injectors  sent to plan

## 2020-07-19 NOTE — Progress Notes (Signed)
Subjective: CC: DM PCP: [name], DO WLS:LHTD Carrie [name] is a 60 y.o. female presenting to clinic today for:  1. Type 2 Diabetes with hypertension, hyperlipidemia:  Ms Saks brings recent lab work from 06/2020.  Her A1c 6.8, lipid panel showed controlled LDL 71, HDL 59, TG 121. LFTs normal range. Renal function normal.  Blood sugars have been doing well.  She is been feeling well.  No reports of chest pain, shortness of breath, visual disturbance, sensory changes.  No dysuria, hematuria or vaginal discharge.  Compliant with Ozempic, 70 units of Lantus daily, Jardiance 25 mg daily, Micardis 40-12.5 mg daily and Lipitor 10 mg today.  She will be starting Nutrisystem with her spouse soon  Last eye exam: UTD Last foot exam: Up-to-date Last A1c:  Lab Results  Component Value Date   HGBA1C 6.8 03/18/2020   Nephropathy screen indicated?:  On ARB Last flu, zoster and/or pneumovax:  Immunization History  Administered Date(s) Administered  . Influenza,inj,Quad PF,6+ Mos 07/07/2016, 03/28/2017, 04/02/2018, 04/07/2019  . Influenza-Unspecified 04/17/2016  . Moderna Sars-Covid-2 Vaccination 08/11/2019, 09/09/2019  . Pneumococcal Polysaccharide-23 01/23/2019  . Tdap 01/23/2019    2. Thrombocytosis Persistently elevated Plt 486.  Remainder of CBC normal.  No reports of night sweats, unplanned weight loss  3.  Migraine headaches Patient did use the Nurtec but cannot recall if this was helpful or not.  She ran out of the sample and has been using Imitrex if needed.  She is really not had that many migraine headaches as of late and she is thankful for this.  4.  Fall Patient reports fall on Monday when we had ice.  She fell directly onto her bottom and has had some sacral pain since.  She is ambulating independently.  She was not evaluated after the fall.  She has still quite a bit of tenderness to palpation to the coccyx.  ROS: Per HPI  No Known Allergies Past Medical History:   Diagnosis Date  . Depression   . Diabetes mellitus without complication ([place])   . Fever blister   . Gastroparesis   . Hyperlipidemia   . Hypertension   . Migraine     Current Outpatient Medications:  .  atorvastatin (LIPITOR) 10 MG tablet, Take 1 tablet (10 mg total) by mouth daily., Disp: 90 tablet, Rfl: 3 .  empagliflozin (JARDIANCE) 25 MG TABS tablet, TAKE 1 TABLET BY MOUTH BEFORE BREAKFAST (Patient taking differently: Take 25 mg by mouth daily. ), Disp: 90 tablet, Rfl: 3 .  gabapentin (NEURONTIN) 300 MG capsule, Take 1-2 capsules (300-600 mg total) by mouth at bedtime., Disp: 60 capsule, Rfl: 0 .  ibuprofen (ADVIL) 400 MG tablet, Take 400 mg by mouth every 6 (six) hours as needed for moderate pain., Disp: , Rfl:  .  Insulin Glargine (LANTUS SOLOSTAR) 100 UNIT/ML Solostar Pen, Inject 64-80 Units into the skin at bedtime., Disp: 30 mL, Rfl: 12 .  Insulin Pen Needle (RELION PEN NEEDLES) 32G X 4 MM MISC, USE TO INJECT MEDICATION ONCE DAILY Dx 10.9, Disp: 100 each, Rfl: 3 .  lidocaine (LIDODERM) 5 %, Place 1 patch onto the skin daily. Remove & Discard patch within 12 hours or as directed by MD, Disp: 30 patch, Rfl: 0 .  methocarbamol (ROBAXIN) 500 MG tablet, Take 1 tablet (500 mg total) by mouth 2 (two) times daily as needed for muscle spasms., Disp: 40 tablet, Rfl: 0 .  omeprazole (PRILOSEC) 40 MG capsule, Take 1 capsule (40 mg total) by  mouth daily., Disp: 30 capsule, Rfl: 11 .  ondansetron (ZOFRAN ODT) 8 MG disintegrating tablet, Take 1 tablet (8 mg total) by mouth every 8 (eight) hours as needed for nausea or vomiting., Disp: 20 tablet, Rfl: 0 .  oxyCODONE-acetaminophen (PERCOCET) 5-325 MG tablet, Take 1 tablet by mouth every 6 (six) hours as needed for severe pain., Disp: 20 tablet, Rfl: 0 .  PARoxetine (PAXIL) 30 MG tablet, Take 1 tablet (30 mg total) by mouth daily., Disp: 90 tablet, Rfl: 2 .  Rimegepant Sulfate (NURTEC) 75 MG TBDP, Take 1 tablet by mouth daily as needed (migraine).,  Disp: 2 tablet, Rfl: 0 .  Semaglutide, 1 MG/DOSE, (OZEMPIC, 1 MG/DOSE,) 2 MG/1.5ML SOPN, INJECT 1MG SUBCUTANEOUSLY ONCE A WEEK (Patient taking differently: Inject 1 mg into the skin once a week. Sunday), Disp: 12 mL, Rfl: 3 .  SUMAtriptan (IMITREX) 100 MG tablet, TAKE 1 TABLET BY MOUTH EVERY 2 HOURS AS NEEDED FOR MIGRAINE HEADACHE (Patient taking differently: Take 100 mg by mouth every 2 (two) hours as needed for migraine or headache. ), Disp: 10 tablet, Rfl: 0 .  telmisartan-hydrochlorothiazide (MICARDIS HCT) 40-12.5 MG tablet, Take 0.5 tablets by mouth daily., Disp: 45 tablet, Rfl: 0 Social History   Socioeconomic History  . Marital status: Married    Spouse name: Not on file  . Number of children: 2  . Years of education: Not on file  . Highest education level: Not on file  Occupational History  . Occupation: customer service  Tobacco Use  . Smoking status: Former Smoker    Packs/day: 0.50    Years: 40.00    Pack years: 20.00    Types: Cigarettes    Quit date: 2006    Years since quitting: 16.1  . Smokeless tobacco: Never Used  Vaping Use  . Vaping Use: Never used  Substance and Sexual Activity  . Alcohol use: Not Currently    Comment: occasional   . Drug use: No  . Sexual activity: Not on file  Other Topics Concern  . Not on file  Social History Narrative   .      Social Determinants of Health   Financial Resource Strain: Not on file  Food Insecurity: Not on file  Transportation Needs: Not on file  Physical Activity: Not on file  Stress: Not on file  Social Connections: Not on file  Intimate Partner Violence: Not on file   Family History  Problem Relation Age of Onset  . Ovarian cancer Mother   . Mental illness Daughter   . Migraines Daughter   . Colon cancer Neg Hx   . Rectal cancer Neg Hx   . Throat cancer Neg Hx     Objective: Office vital signs reviewed. BP 108/80   Pulse 91   Temp (!) 97.5 F (36.4 C)   Ht 5' 6"  (1.676 m)   Wt 191 lb (86.6 kg)    SpO2 94%   BMI 30.83 kg/m   Physical Examination:  General: Awake, alert, well nourished, No acute distress HEENT: Normal, sclera white, MMM Cardio: regular rate and rhythm, S1S2 heard, no murmurs appreciated Pulm: clear to auscultation bilaterally, no wheezes, rhonchi or rales; normal work of breathing on room air Extremities: warm, well perfused, No edema, cyanosis or clubbing; +2 pulses bilaterally MSK: antalgic gait and station  DG Sacrum/Coccyx  Result Date: 07/19/2020 CLINICAL DATA:  Coccygeal pain post fall EXAM: SACRUM AND COCCYX - 2+ VIEW COMPARISON:  None FINDINGS: Hip and SI joints symmetric and preserved.  Borderline decreased osseous mineralization. Sacral foramina symmetric. No definite sacrococcygeal fracture identified. IMPRESSION: No definite acute osseous abnormalities. Electronically Signed   By: Lavonia Dana M.D.   On: 07/19/2020 08:40    Assessment/ Plan: 60 y.o. female   Type 2 diabetes mellitus with other specified complication, without long-term current use of insulin ([place]) - Plan: CANCELED: CMP14+EGFR, CANCELED: Bayer DCA Hb A1c Waived  Hyperlipidemia associated with type 2 diabetes mellitus ([place]) - Plan: CANCELED: CMP14+EGFR, CANCELED: Lipid Panel  Hypertension associated with diabetes ([place]) - Plan: CANCELED: CMP14+EGFR  Thrombocytosis - Plan: CANCELED: CBC with Differential  Coccygeal pain - Plan: DG Sacrum/Coccyx  Other migraine without status migrainosus, not intractable - Plan: Rimegepant Sulfate (NURTEC) 75 MG TBDP  Sugar is well controlled.  I have utilized the work labs instead of ordering labs here in office.  These will be scanned into her chart.  I have sent in semglee which is what is preferred by her insurance now.  We discussed that this is the same active ingredient in Lantus.  Cholesterol is well controlled.  Continue current regimen  Blood pressure well controlled.  Continue current regimen, could consider discontinuation of diuretic if  blood pressure continues to be on the low side of normal.  She is currently feeling well so this was not changed  CBC showed a persistently mildly elevated platelet count.  She is asymptomatic and work-up thus far has been unremarkable  X-ray was obtained to evaluate the sacrum/coccyx.  There are no apparent fractures or dislocations.  Likely a bony contusion.  Supportive care recommended  Another sample of Nurtec provided.  Have also sent in a prescription given her coupon.  Would prefer for her not to be on the triptan given increase cardiovascular risk  No orders of the defined types were placed in this encounter.  No orders of the defined types were placed in this encounter.    [name], DO [place] [phone]

## 2020-07-19 NOTE — Telephone Encounter (Signed)
(  Key: [id number]) Nurtec 75MG  dispersible tablets    SENT TO PLAN

## 2020-07-19 NOTE — Telephone Encounter (Signed)
Approved today Effective from 07/19/2020 through 07/18/2021.  WM called and aware

## 2020-07-23 NOTE — Telephone Encounter (Signed)
Paper forms came in = filled out and placed on Dr Alver Sorrow desk for signature - then needs to be faxed back.  Note on it to return to PA bin

## 2020-07-26 NOTE — Telephone Encounter (Signed)
Nurtec has been denied.  °

## 2020-08-01 ENCOUNTER — Other Ambulatory Visit: Payer: Self-pay | Admitting: Family Medicine

## 2020-08-01 DIAGNOSIS — G43809 Other migraine, not intractable, without status migrainosus: Secondary | ICD-10-CM

## 2020-08-22 ENCOUNTER — Other Ambulatory Visit: Payer: Self-pay | Admitting: Family Medicine

## 2020-08-22 DIAGNOSIS — E119 Type 2 diabetes mellitus without complications: Secondary | ICD-10-CM

## 2020-08-22 DIAGNOSIS — Z01419 Encounter for gynecological examination (general) (routine) without abnormal findings: Secondary | ICD-10-CM | POA: Diagnosis not present

## 2020-08-25 ENCOUNTER — Other Ambulatory Visit: Payer: Self-pay | Admitting: Family Medicine

## 2020-08-25 DIAGNOSIS — E119 Type 2 diabetes mellitus without complications: Secondary | ICD-10-CM

## 2020-08-26 ENCOUNTER — Other Ambulatory Visit: Payer: Self-pay | Admitting: Family Medicine

## 2020-08-26 ENCOUNTER — Encounter: Payer: Self-pay | Admitting: Family Medicine

## 2020-08-26 DIAGNOSIS — E119 Type 2 diabetes mellitus without complications: Secondary | ICD-10-CM

## 2020-08-26 NOTE — Progress Notes (Unsigned)
glrg

## 2020-08-26 NOTE — Telephone Encounter (Signed)
Pt called in and said she will need prior auth for new diabetes meds. She wants to know if Dr Darnell Level will call in some lantus. She has been out for 2 days

## 2020-08-26 NOTE — Telephone Encounter (Signed)
Pt aware refill sent to pharmacy 

## 2020-08-26 NOTE — Telephone Encounter (Signed)
Pt wants an update on this please call back

## 2020-09-13 ENCOUNTER — Other Ambulatory Visit: Payer: Self-pay

## 2020-09-13 DIAGNOSIS — E1159 Type 2 diabetes mellitus with other circulatory complications: Secondary | ICD-10-CM

## 2020-09-13 DIAGNOSIS — I152 Hypertension secondary to endocrine disorders: Secondary | ICD-10-CM

## 2020-09-13 MED ORDER — TELMISARTAN-HCTZ 40-12.5 MG PO TABS: 0.5000 | ORAL_TABLET | Freq: Every day | ORAL | 0 refills | Status: DC

## 2020-09-29 DIAGNOSIS — L821 Other seborrheic keratosis: Secondary | ICD-10-CM | POA: Diagnosis not present

## 2020-09-29 DIAGNOSIS — L820 Inflamed seborrheic keratosis: Secondary | ICD-10-CM | POA: Diagnosis not present

## 2020-09-29 DIAGNOSIS — D225 Melanocytic nevi of trunk: Secondary | ICD-10-CM | POA: Diagnosis not present

## 2020-10-05 ENCOUNTER — Other Ambulatory Visit: Payer: Self-pay | Admitting: *Deleted

## 2020-10-05 ENCOUNTER — Encounter: Payer: Self-pay | Admitting: Family Medicine

## 2020-10-05 DIAGNOSIS — E119 Type 2 diabetes mellitus without complications: Secondary | ICD-10-CM

## 2020-10-05 MED ORDER — LANTUS SOLOSTAR 100 UNIT/ML ~~LOC~~ SOPN: 70.0000 [IU] | PEN_INJECTOR | Freq: Every day | SUBCUTANEOUS | 2 refills | Status: DC

## 2020-10-25 ENCOUNTER — Other Ambulatory Visit: Payer: Self-pay | Admitting: Family Medicine

## 2020-10-25 DIAGNOSIS — F324 Major depressive disorder, single episode, in partial remission: Secondary | ICD-10-CM

## 2020-11-16 ENCOUNTER — Encounter: Payer: Self-pay | Admitting: Family Medicine

## 2020-11-16 ENCOUNTER — Other Ambulatory Visit: Payer: Self-pay

## 2020-11-16 ENCOUNTER — Ambulatory Visit: Payer: BC Managed Care – PPO | Admitting: Family Medicine

## 2020-11-16 VITALS — BP 115/71 | HR 80 | Temp 97.6°F | Ht 66.0 in | Wt 188.6 lb

## 2020-11-16 DIAGNOSIS — E1159 Type 2 diabetes mellitus with other circulatory complications: Secondary | ICD-10-CM

## 2020-11-16 DIAGNOSIS — Z23 Encounter for immunization: Secondary | ICD-10-CM | POA: Diagnosis not present

## 2020-11-16 DIAGNOSIS — E1169 Type 2 diabetes mellitus with other specified complication: Secondary | ICD-10-CM

## 2020-11-16 DIAGNOSIS — I152 Hypertension secondary to endocrine disorders: Secondary | ICD-10-CM | POA: Diagnosis not present

## 2020-11-16 DIAGNOSIS — E785 Hyperlipidemia, unspecified: Secondary | ICD-10-CM

## 2020-11-16 LAB — BAYER DCA HB A1C WAIVED: HB A1C (BAYER DCA - WAIVED): 6.1 % (ref <7.0)

## 2020-11-16 MED ORDER — SEMGLEE 100 UNIT/ML ~~LOC~~ SOPN
70.0000 [IU] | PEN_INJECTOR | Freq: Every day | SUBCUTANEOUS | 3 refills | Status: DC
Start: 2020-11-16 — End: 2021-11-07

## 2020-11-16 NOTE — Progress Notes (Signed)
Subjective: CC: DM PCP: Carrie Norlander, DO QIO:NGEX MELLISSA CONLEY is a 60 y.o. female presenting to clinic today for:  1. Type 2 Diabetes with hypertension, hyperlipidemia:  Patient is compliant with all of her medications.  She is injecting 70 units of Semglee daily, taking Jardiance 25 mg daily, injecting 1 mg of Ozempic weekly.  She is compliant with atorvastatin 10 mg daily and her myocarditis 40-12.5 mg 1/2 tablet daily.  She had been on a Nutrisystem diet and successfully lost about 8 pounds.  She recently discontinued this and has been maintaining on her own.  Last eye exam: UTD Last foot exam: UTD Last A1c:  Lab Results  Component Value Date   HGBA1C 6.8 03/18/2020   Nephropathy screen indicated?: UTD Last flu, zoster and/or pneumovax:  Immunization History  Administered Date(s) Administered   Influenza,inj,Quad PF,6+ Mos 07/07/2016, 03/28/2017, 04/02/2018, 04/07/2019   Influenza-Unspecified 04/17/2016   Moderna Sars-Covid-2 Vaccination 08/11/2019, 09/09/2019, 06/11/2020   Pneumococcal Polysaccharide-23 01/23/2019   Tdap 01/23/2019    ROS: Denies dizziness, LOC, polyuria, polydipsia, unintended weight loss/gain, foot ulcerations, numbness or tingling in extremities, shortness of breath or chest pain.    ROS: Per HPI  No Known Allergies Past Medical History:  Diagnosis Date   Depression    Diabetes mellitus without complication ([facility])    Fever blister    Gastroparesis    Hyperlipidemia    Hypertension    Migraine     Current Outpatient Medications:    atorvastatin (LIPITOR) 10 MG tablet, Take 1 tablet (10 mg total) by mouth daily., Disp: 90 tablet, Rfl: 3   empagliflozin (JARDIANCE) 25 MG TABS tablet, TAKE 1 TABLET BY MOUTH BEFORE BREAKFAST (Patient taking differently: Take 25 mg by mouth daily. ), Disp: 90 tablet, Rfl: 3   gabapentin (NEURONTIN) 300 MG capsule, Take 1-2 capsules (300-600 mg total) by mouth at bedtime., Disp: 60 capsule, Rfl: 0   ibuprofen  (ADVIL) 400 MG tablet, Take 400 mg by mouth every 6 (six) hours as needed for moderate pain., Disp: , Rfl:    insulin glargine (LANTUS SOLOSTAR) 100 UNIT/ML Solostar Pen, Inject 70 Units into the skin daily., Disp: 30 mL, Rfl: 2   Insulin Pen Needle (RELION PEN NEEDLES) 32G X 4 MM MISC, USE TO INJECT MEDICATION ONCE DAILY Dx 10.9, Disp: 100 each, Rfl: 3   lidocaine (LIDODERM) 5 %, Place 1 patch onto the skin daily. Remove & Discard patch within 12 hours or as directed by MD, Disp: 30 patch, Rfl: 0   methocarbamol (ROBAXIN) 500 MG tablet, Take 1 tablet (500 mg total) by mouth 2 (two) times daily as needed for muscle spasms., Disp: 40 tablet, Rfl: 0   omeprazole (PRILOSEC) 40 MG capsule, Take 1 capsule (40 mg total) by mouth daily., Disp: 30 capsule, Rfl: 11   ondansetron (ZOFRAN ODT) 8 MG disintegrating tablet, Take 1 tablet (8 mg total) by mouth every 8 (eight) hours as needed for nausea or vomiting., Disp: 20 tablet, Rfl: 0   PARoxetine (PAXIL) 30 MG tablet, Take 1 tablet by mouth once daily, Disp: 30 tablet, Rfl: 0   Semaglutide, 1 MG/DOSE, (OZEMPIC, 1 MG/DOSE,) 2 MG/1.5ML SOPN, INJECT 1MG  SUBCUTANEOUSLY ONCE A WEEK (Patient taking differently: Inject 1 mg into the skin once a week. Sunday), Disp: 12 mL, Rfl: 3   SUMAtriptan (IMITREX) 100 MG tablet, TAKE 1 TABLET BY MOUTH EVERY 2 HOURS AS NEEDED FOR MIGRAINE HEADACHE, Disp: 10 tablet, Rfl: 2   telmisartan-hydrochlorothiazide (MICARDIS HCT) 40-12.5 MG tablet,  Take 0.5 tablets by mouth daily., Disp: 45 tablet, Rfl: 0 Social History   Socioeconomic History   Marital status: Married    Spouse name: Not on file   Number of children: 2   Years of education: Not on file   Highest education level: Not on file  Occupational History   Occupation: customer service  Tobacco Use   Smoking status: Former    Packs/day: 0.50    Years: 40.00    Pack years: 20.00    Types: Cigarettes    Quit date: 2006    Years since quitting: 16.4   Smokeless tobacco:  Never  Vaping Use   Vaping Use: Never used  Substance and Sexual Activity   Alcohol use: Not Currently    Comment: occasional    Drug use: No   Sexual activity: Not on file  Other Topics Concern   Not on file  Social History Narrative   .      Social Determinants of Health   Financial Resource Strain: Not on file  Food Insecurity: Not on file  Transportation Needs: Not on file  Physical Activity: Not on file  Stress: Not on file  Social Connections: Not on file  Intimate Partner Violence: Not on file   Family History  Problem Relation Age of Onset   Ovarian cancer Mother    Mental illness Daughter    Migraines Daughter    Colon cancer Neg Hx    Rectal cancer Neg Hx    Throat cancer Neg Hx     Objective: Office vital signs reviewed. BP 115/71   Pulse 80   Temp 97.6 F (36.4 C)   Ht 5\' 6"  (1.676 m)   Wt 188 lb 9.6 oz (85.5 kg)   SpO2 95%   BMI 30.44 kg/m   Physical Examination:  General: Awake, alert, well nourished, No acute distress HEENT: Normal, sclera white, MMM Cardio: regular rate and rhythm, S1S2 heard, no murmurs appreciated Pulm: clear to auscultation bilaterally, no wheezes, rhonchi or rales; normal work of breathing on room air Extremities: warm, well perfused, No edema, cyanosis or clubbing; +2 pulses bilaterally MSK: normal gait and station  Assessment/ Plan: 60 y.o. female   Type 2 diabetes mellitus with other specified complication, without long-term current use of insulin ([facility]) - Plan: Bayer DCA Hb A1c Waived, insulin glargine (SEMGLEE) 100 UNIT/ML Solostar Pen  Hyperlipidemia associated with type 2 diabetes mellitus ([place])  Hypertension associated with diabetes ([place])  Need for shingles vaccine  Sugar under excellent control with A1c of 6.1 today.  Continue current regimen.  Continue statin.  Not due for fasting lipid panel.  Blood pressure under excellent control with half tablet of telmisartan daily.  No changes.  Shingles  vaccination was administered on today's visit.  May follow-up in 6 months, sooner if needed  No orders of the defined types were placed in this encounter.  No orders of the defined types were placed in this encounter.    Carrie Norlander, DO [place] [phone]

## 2020-11-24 ENCOUNTER — Other Ambulatory Visit: Payer: Self-pay | Admitting: *Deleted

## 2020-11-24 DIAGNOSIS — I152 Hypertension secondary to endocrine disorders: Secondary | ICD-10-CM

## 2020-11-24 DIAGNOSIS — E1159 Type 2 diabetes mellitus with other circulatory complications: Secondary | ICD-10-CM

## 2020-11-24 MED ORDER — TELMISARTAN-HCTZ 40-12.5 MG PO TABS: 0.5000 | ORAL_TABLET | Freq: Every day | ORAL | 1 refills | Status: DC

## 2020-12-03 ENCOUNTER — Other Ambulatory Visit: Payer: Self-pay | Admitting: Family Medicine

## 2020-12-03 DIAGNOSIS — F324 Major depressive disorder, single episode, in partial remission: Secondary | ICD-10-CM

## 2021-01-04 ENCOUNTER — Other Ambulatory Visit: Payer: Self-pay | Admitting: Family Medicine

## 2021-01-16 ENCOUNTER — Other Ambulatory Visit: Payer: Self-pay | Admitting: Family Medicine

## 2021-01-16 DIAGNOSIS — E1169 Type 2 diabetes mellitus with other specified complication: Secondary | ICD-10-CM

## 2021-01-16 DIAGNOSIS — E785 Hyperlipidemia, unspecified: Secondary | ICD-10-CM

## 2021-01-20 ENCOUNTER — Encounter: Payer: Self-pay | Admitting: *Deleted

## 2021-01-21 ENCOUNTER — Other Ambulatory Visit: Payer: Self-pay | Admitting: Family Medicine

## 2021-01-21 DIAGNOSIS — G43809 Other migraine, not intractable, without status migrainosus: Secondary | ICD-10-CM

## 2021-02-01 ENCOUNTER — Other Ambulatory Visit: Payer: Self-pay | Admitting: Family Medicine

## 2021-02-01 DIAGNOSIS — E1169 Type 2 diabetes mellitus with other specified complication: Secondary | ICD-10-CM

## 2021-02-08 ENCOUNTER — Other Ambulatory Visit: Payer: Self-pay | Admitting: Family Medicine

## 2021-02-08 DIAGNOSIS — F324 Major depressive disorder, single episode, in partial remission: Secondary | ICD-10-CM

## 2021-02-08 DIAGNOSIS — E1169 Type 2 diabetes mellitus with other specified complication: Secondary | ICD-10-CM

## 2021-03-10 ENCOUNTER — Other Ambulatory Visit: Payer: Self-pay | Admitting: Family Medicine

## 2021-03-12 IMAGING — MG DIGITAL DIAGNOSTIC BILAT W/ TOMO W/ CAD
8 series · 8 of 24 positions shown · non-contrast
Comparison: Previous exam(s).

CLINICAL DATA: 59-year-old female for 2 year follow-up of LEFT
breast asymmetry without sonographic correlate and for annual
bilateral mammogram.

EXAM:
DIGITAL DIAGNOSTIC BILATERAL MAMMOGRAM WITH CAD AND TOMO

[L MLO synth-2D]
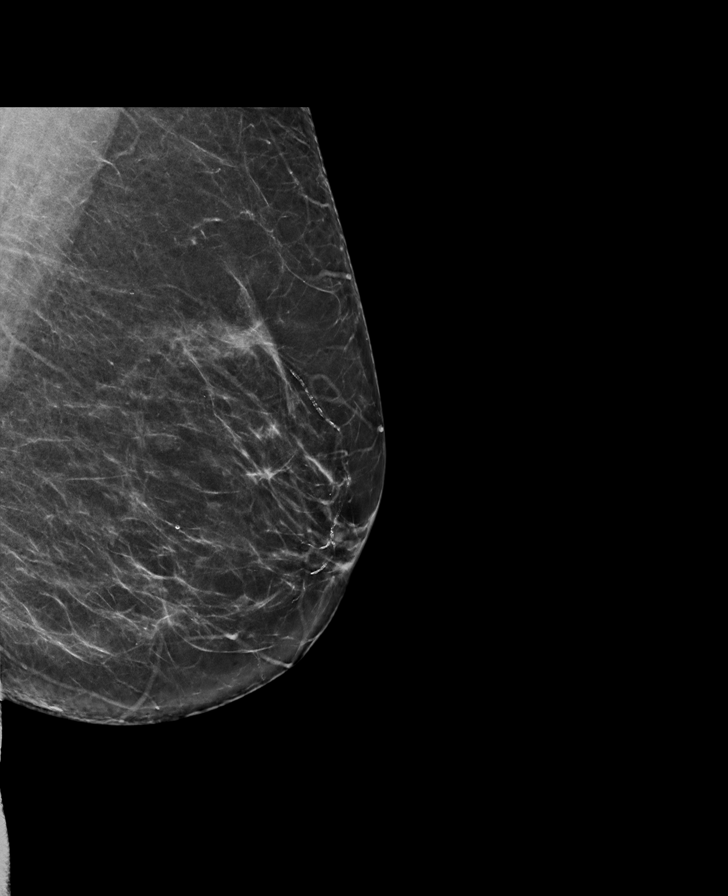

[R CC synth-2D]
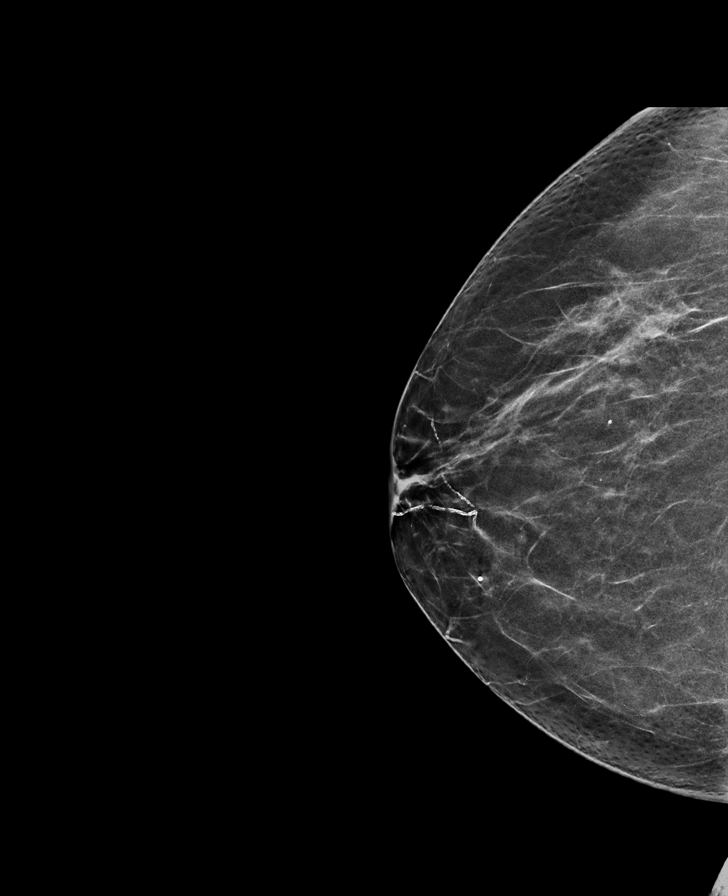

[L CC synth-2D]
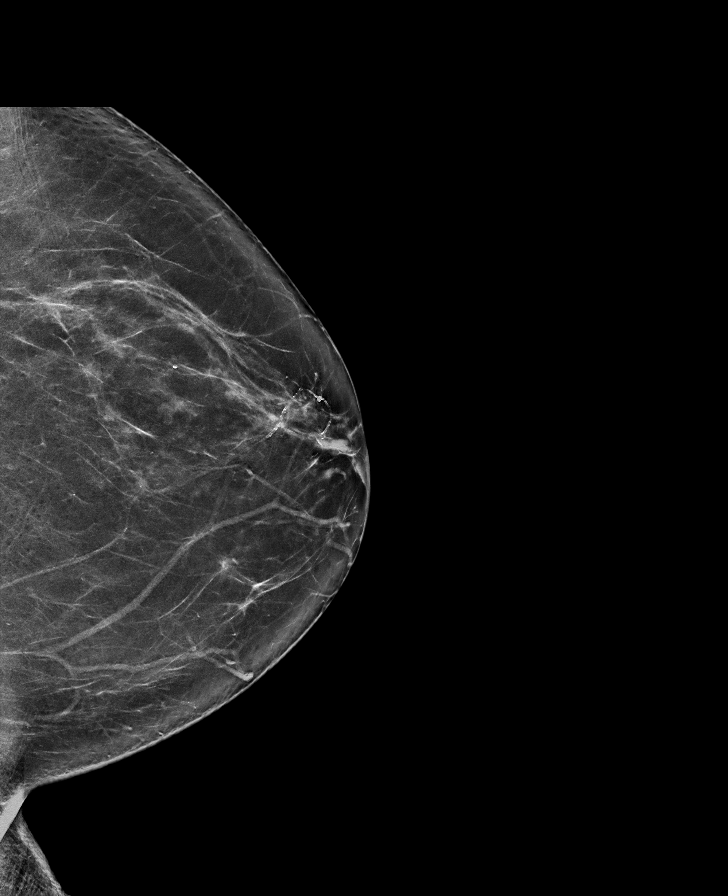

[R MLO synth-2D]
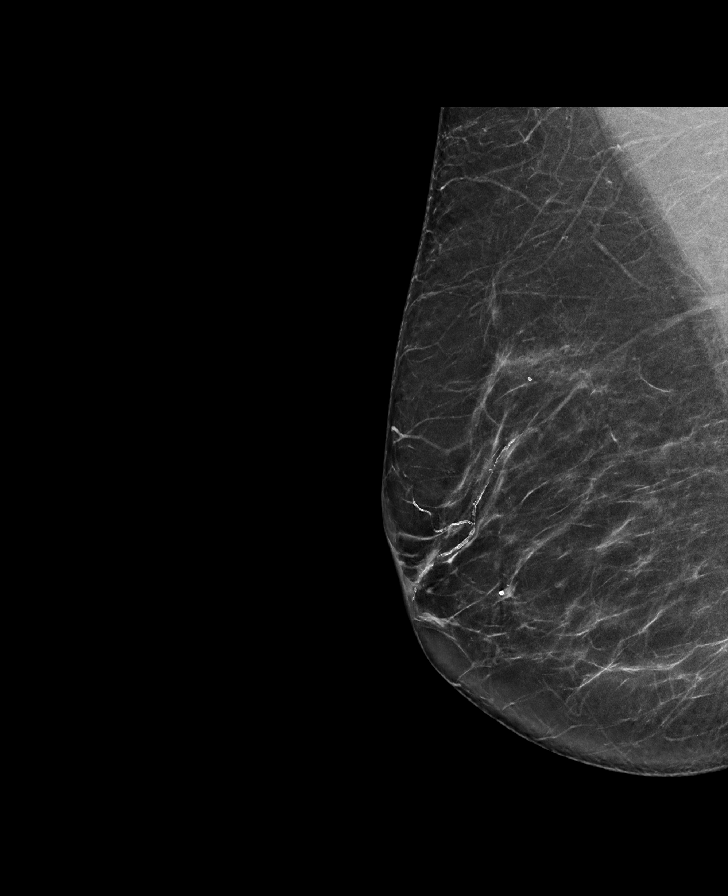

[L CC tomo · tomo slice 39/77.0]
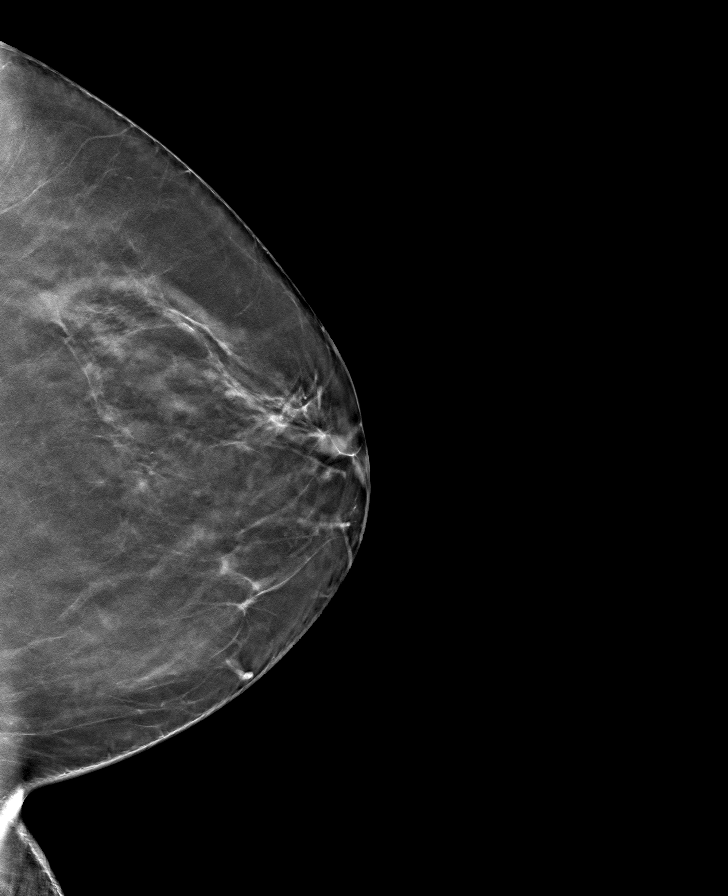

[L MLO tomo · tomo slice 39/77.0]
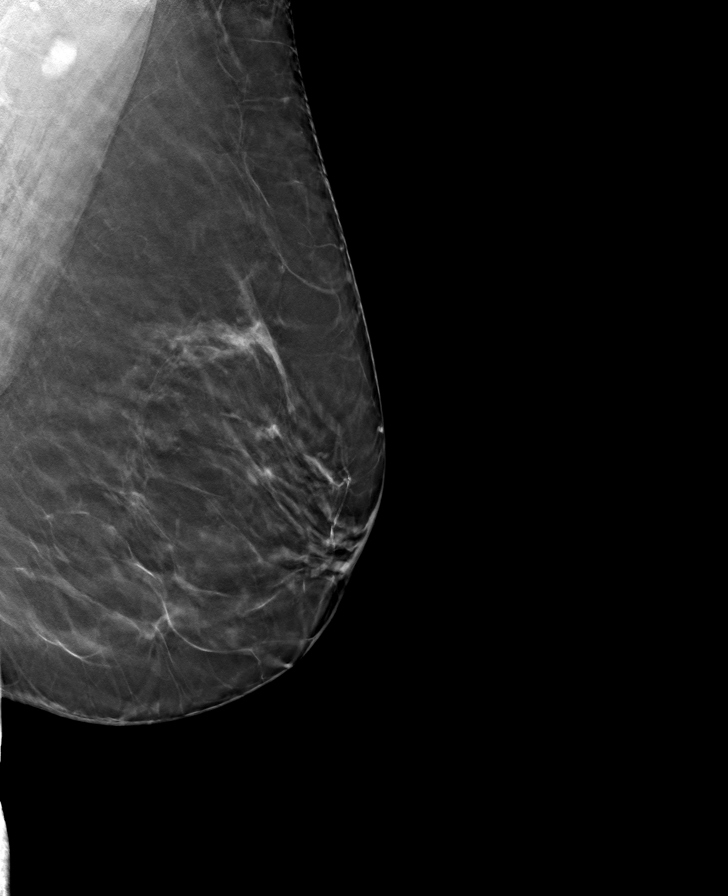

[R CC tomo · tomo slice 22/43.0]
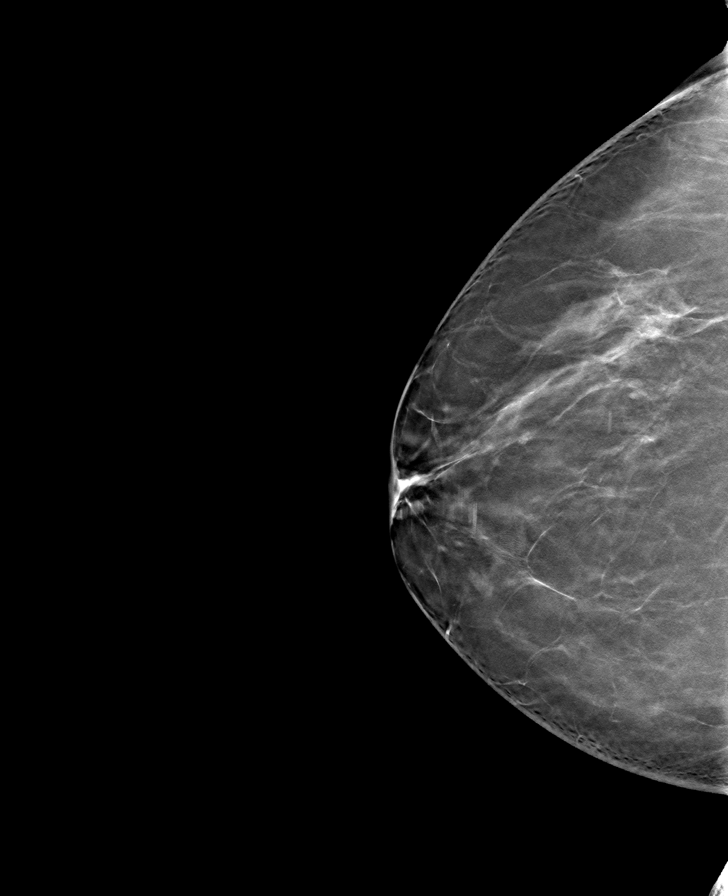

[R MLO tomo · tomo slice 39/76.0]
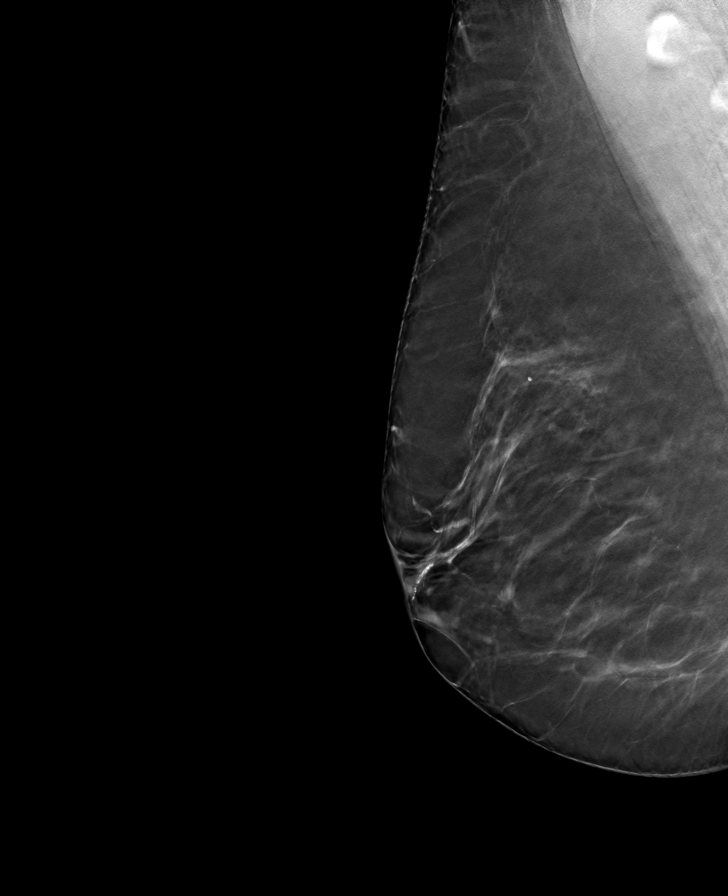

[8 of 24 positions shown; findings below may reference images not displayed]

ACR Breast Density Category b: There are scattered areas of
fibroglandular density.
FINDINGS: 2D/3D full field views of both breasts demonstrate no suspicious
mass, distortion or worrisome calcifications.

The previously identified LEFT breast asymmetry is no longer
visualized.

Mammographic images were processed with CAD.
IMPRESSION: No mammographic evidence of breast malignancy.

RECOMMENDATION:
Bilateral screening mammogram in 1 year.

I have discussed the findings and recommendations with the patient.
Results were also provided in writing at the conclusion of the
visit. If applicable, a reminder letter will be sent to the patient
regarding the next appointment.

BI-RADS CATEGORY  1: Negative.

## 2021-03-15 ENCOUNTER — Other Ambulatory Visit: Payer: Self-pay

## 2021-03-15 ENCOUNTER — Ambulatory Visit (INDEPENDENT_AMBULATORY_CARE_PROVIDER_SITE_OTHER): Payer: BC Managed Care – PPO | Admitting: Family Medicine

## 2021-03-15 ENCOUNTER — Encounter: Payer: Self-pay | Admitting: Family Medicine

## 2021-03-15 DIAGNOSIS — J4521 Mild intermittent asthma with (acute) exacerbation: Secondary | ICD-10-CM

## 2021-03-15 DIAGNOSIS — U071 COVID-19: Secondary | ICD-10-CM

## 2021-03-15 MED ORDER — ALBUTEROL SULFATE HFA 108 (90 BASE) MCG/ACT IN AERS: 2.0000 | INHALATION_SPRAY | Freq: Four times a day (QID) | RESPIRATORY_TRACT | 0 refills | Status: DC | PRN

## 2021-03-15 MED ORDER — MOLNUPIRAVIR EUA 200MG CAPSULE
4.0000 | ORAL_CAPSULE | Freq: Two times a day (BID) | ORAL | 0 refills | Status: AC
Start: 2021-03-15 — End: 2021-03-20

## 2021-03-15 NOTE — Progress Notes (Signed)
Virtual Visit via telephone Note  I connected with Carrie [name] on 03/15/21 at 1055 by telephone and verified that I am speaking with the correct person using two identifiers. Carrie [name] is currently located at home and patient are currently with her during visit. The provider, Fransisca Kaufmann Shequila Neglia, MD is located in their office at time of visit.  Call ended at 1103  I discussed the limitations, risks, security and privacy concerns of performing an evaluation and management service by telephone and the availability of in person appointments. I also discussed with the patient that there may be a patient responsible charge related to this service. The patient expressed understanding and agreed to proceed.   History and Present Illness: Patient is calling in for congestion and headaches and sore throat that started yesterday.  She tested at home this am and positive.  Her granddaughter was sick last week. She is having some wheezing and some chest congestion. She received 2 vaccines and 1 booster.  She does feel like it is getting more down into her chest and cough and congestion feels like she is getting worse.  She does have diabetes hypertension and morbid obesity are risk factors as well as a history of asthma when she was a child.  1. COVID-19 virus infection   2. Mild intermittent asthma with exacerbation     Outpatient Encounter Medications as of 03/15/2021  Medication Sig   albuterol (VENTOLIN HFA) 108 (90 Base) MCG/ACT inhaler Inhale 2 puffs into the lungs every 6 (six) hours as needed for wheezing or shortness of breath.   molnupiravir EUA (LAGEVRIO) 200 mg CAPS capsule Take 4 capsules (800 mg total) by mouth 2 (two) times daily for 5 days.   SUMAtriptan (IMITREX) 100 MG tablet TAKE 1 TABLET BY MOUTH EVERY 2 HOURS AS NEEDED FOR MIGRAINE HEADACHE   atorvastatin (LIPITOR) 10 MG tablet Take 1 tablet by mouth once daily   gabapentin (NEURONTIN) 300 MG capsule Take 1-2 capsules (300-600  mg total) by mouth at bedtime.   ibuprofen (ADVIL) 400 MG tablet Take 400 mg by mouth every 6 (six) hours as needed for moderate pain.   insulin glargine (SEMGLEE) 100 UNIT/ML Solostar Pen Inject 70 Units into the skin daily.   Insulin Pen Needle (RELION PEN NEEDLES) 32G X 4 MM MISC USE TO INJECT MEDICATION ONCE DAILY Dx 10.9   JARDIANCE 25 MG TABS tablet TAKE 1 TABLET BY MOUTH BEFORE BREAKFAST   lidocaine (LIDODERM) 5 % Place 1 patch onto the skin daily. Remove & Discard patch within 12 hours or as directed by MD   methocarbamol (ROBAXIN) 500 MG tablet Take 1 tablet (500 mg total) by mouth 2 (two) times daily as needed for muscle spasms.   omeprazole (PRILOSEC) 40 MG capsule Take 1 capsule (40 mg total) by mouth daily.   ondansetron (ZOFRAN ODT) 8 MG disintegrating tablet Take 1 tablet (8 mg total) by mouth every 8 (eight) hours as needed for nausea or vomiting.   OZEMPIC, 1 MG/DOSE, 4 MG/3ML SOPN INJECT 1 MG SUBCUTANEOUSLY ONCE A WEEK   PARoxetine (PAXIL) 30 MG tablet Take 1 tablet by mouth once daily   telmisartan-hydrochlorothiazide (MICARDIS HCT) 40-12.5 MG tablet Take 0.5 tablets by mouth daily.   No facility-administered encounter medications on file as of 03/15/2021.    Review of Systems  Constitutional:  Positive for chills and fever.  HENT:  Positive for congestion, postnasal drip, rhinorrhea, sinus pressure, sneezing and sore throat. Negative for ear discharge  and ear pain.   Eyes:  Negative for visual disturbance.  Respiratory:  Positive for cough, shortness of breath and wheezing. Negative for chest tightness.   Cardiovascular:  Negative for chest pain and leg swelling.  Genitourinary:  Negative for difficulty urinating and dysuria.  Musculoskeletal:  Positive for myalgias.  Skin:  Negative for color change and rash.  Neurological:  Negative for dizziness, light-headedness and headaches.  Psychiatric/Behavioral:  Negative for agitation and behavioral problems.   All other  systems reviewed and are negative.  Observations/Objective: Patient sounds comfortable and has some cough and congestion but otherwise sounds comfortable.  Assessment and Plan: Problem List Items Addressed This Visit   None Visit Diagnoses     COVID-19 virus infection    -  Primary   Relevant Medications   molnupiravir EUA (LAGEVRIO) 200 mg CAPS capsule   albuterol (VENTOLIN HFA) 108 (90 Base) MCG/ACT inhaler   Other Relevant Orders   Novel Coronavirus, NAA ([facility])   Mild intermittent asthma with exacerbation       Relevant Medications   molnupiravir EUA (LAGEVRIO) 200 mg CAPS capsule   albuterol (VENTOLIN HFA) 108 (90 Base) MCG/ACT inhaler   Other Relevant Orders   Novel Coronavirus, NAA ([facility])       Will treat with antiviral and albuterol inhaler but if anything worsens instructed patient to let us know we can always do steroids in the future, just held off for now because of her diabetes. Follow up plan: Return if symptoms worsen or fail to improve.     I discussed the assessment and treatment plan with the patient. The patient was provided an opportunity to ask questions and all were answered. The patient agreed with the plan and demonstrated an understanding of the instructions.   The patient was advised to call back or seek an in-person evaluation if the symptoms worsen or if the condition fails to improve as anticipated.  The above assessment and management plan was discussed with the patient. The patient verbalized understanding of and has agreed to the management plan. Patient is aware to call the clinic if symptoms persist or worsen. Patient is aware when to return to the clinic for a follow-up visit. Patient educated on when it is appropriate to go to the emergency department.    I provided 8 minutes of non-face-to-face time during this encounter.    Worthy Rancher, MD

## 2021-03-16 LAB — NOVEL CORONAVIRUS, NAA: SARS-CoV-2, NAA: DETECTED — AB

## 2021-03-16 LAB — SARS-COV-2, NAA 2 DAY TAT

## 2021-03-22 ENCOUNTER — Ambulatory Visit: Payer: BC Managed Care – PPO | Admitting: Family Medicine

## 2021-03-22 DIAGNOSIS — B9689 Other specified bacterial agents as the cause of diseases classified elsewhere: Secondary | ICD-10-CM | POA: Diagnosis not present

## 2021-03-22 DIAGNOSIS — J019 Acute sinusitis, unspecified: Secondary | ICD-10-CM | POA: Diagnosis not present

## 2021-03-22 DIAGNOSIS — Z8616 Personal history of COVID-19: Secondary | ICD-10-CM | POA: Diagnosis not present

## 2021-03-22 MED ORDER — AMOXICILLIN-POT CLAVULANATE 875-125 MG PO TABS: 1.0000 | ORAL_TABLET | Freq: Two times a day (BID) | ORAL | 0 refills | Status: DC

## 2021-03-22 MED ORDER — AZELASTINE HCL 0.1 % NA SOLN: 1.0000 | Freq: Two times a day (BID) | NASAL | 12 refills | Status: DC

## 2021-03-22 MED ORDER — LEVOCETIRIZINE DIHYDROCHLORIDE 5 MG PO TABS: 5.0000 mg | ORAL_TABLET | Freq: Every evening | ORAL | 12 refills | Status: DC

## 2021-03-22 NOTE — Progress Notes (Signed)
Telephone visit  Subjective: CC: URI PCP: [name], DO ASN:KNLZ RAYLEY GAO is a 60 y.o. female calls for telephone consult today. Patient provides verbal consent for consult held via phone.  Due to COVID-19 pandemic this visit was conducted virtually. This visit type was conducted due to national recommendations for restrictions regarding the COVID-19 Pandemic (e.g. social distancing, sheltering in place) in an effort to limit this patient's exposure and mitigate transmission in our community. All issues noted in this document were discussed and addressed.  A physical exam was not performed with this format.   Location of patient: work Location of provider: [facility] Others present for call: none  1. Sore throat Patient just completed treatment for COVID19 with molnupiravir.  She continues to have copious nasal drainage that she describes as purulent in nature.  She reports sore throat that is not relieved by Tylenol.  She not currently taking any antihistamines, using any nasal sprays.  No reports of fevers, shortness of breath.   ROS: Per HPI  No Known Allergies Past Medical History:  Diagnosis Date   Depression    Diabetes mellitus without complication (HCC)    Fever blister    Gastroparesis    Hyperlipidemia    Hypertension    Migraine     Current Outpatient Medications:    SUMAtriptan (IMITREX) 100 MG tablet, TAKE 1 TABLET BY MOUTH EVERY 2 HOURS AS NEEDED FOR MIGRAINE HEADACHE, Disp: 10 tablet, Rfl: 3   albuterol (VENTOLIN HFA) 108 (90 Base) MCG/ACT inhaler, Inhale 2 puffs into the lungs every 6 (six) hours as needed for wheezing or shortness of breath., Disp: 8 g, Rfl: 0   atorvastatin (LIPITOR) 10 MG tablet, Take 1 tablet by mouth once daily, Disp: 90 tablet, Rfl: 0   gabapentin (NEURONTIN) 300 MG capsule, Take 1-2 capsules (300-600 mg total) by mouth at bedtime., Disp: 60 capsule, Rfl: 0   ibuprofen (ADVIL) 400 MG tablet, Take 400 mg by mouth every 6 (six) hours as  needed for moderate pain., Disp: , Rfl:    insulin glargine (SEMGLEE) 100 UNIT/ML Solostar Pen, Inject 70 Units into the skin daily., Disp: 60 mL, Rfl: 3   Insulin Pen Needle (RELION PEN NEEDLES) 32G X 4 MM MISC, USE TO INJECT MEDICATION ONCE DAILY Dx 10.9, Disp: 100 each, Rfl: 3   JARDIANCE 25 MG TABS tablet, TAKE 1 TABLET BY MOUTH BEFORE BREAKFAST, Disp: 90 tablet, Rfl: 0   lidocaine (LIDODERM) 5 %, Place 1 patch onto the skin daily. Remove & Discard patch within 12 hours or as directed by MD, Disp: 30 patch, Rfl: 0   methocarbamol (ROBAXIN) 500 MG tablet, Take 1 tablet (500 mg total) by mouth 2 (two) times daily as needed for muscle spasms., Disp: 40 tablet, Rfl: 0   omeprazole (PRILOSEC) 40 MG capsule, Take 1 capsule (40 mg total) by mouth daily., Disp: 30 capsule, Rfl: 11   ondansetron (ZOFRAN ODT) 8 MG disintegrating tablet, Take 1 tablet (8 mg total) by mouth every 8 (eight) hours as needed for nausea or vomiting., Disp: 20 tablet, Rfl: 0   OZEMPIC, 1 MG/DOSE, 4 MG/3ML SOPN, INJECT 1 MG SUBCUTANEOUSLY ONCE A WEEK, Disp: 9 mL, Rfl: 0   PARoxetine (PAXIL) 30 MG tablet, Take 1 tablet by mouth once daily, Disp: 30 tablet, Rfl: 2   telmisartan-hydrochlorothiazide (MICARDIS HCT) 40-12.5 MG tablet, Take 0.5 tablets by mouth daily., Disp: 45 tablet, Rfl: 1  Assessment/ Plan: 60 y.o. female   Acute bacterial sinusitis - Plan: levocetirizine (  XYZAL) 5 MG tablet, azelastine (ASTELIN) 0.1 % nasal spray, amoxicillin-clavulanate (AUGMENTIN) 875-125 MG tablet  Personal history of COVID-19 - Plan: levocetirizine (XYZAL) 5 MG tablet, azelastine (ASTELIN) 0.1 % nasal spray  Will treat empirically for presumed secondary bacterial sinusitis given reports of purulent material coming from her naris.  Augmentin sent.  Astelin nasal spray, Xyzal for drainage.  Home care instructions reviewed and reasons for reevaluation discussed.  Follow-up as needed  Start time: 12:06pm End time: 12:11pm  Total time spent  on patient care (including telephone call/ virtual visit): 5 minutes  [place], [place] [phone]

## 2021-03-24 ENCOUNTER — Encounter: Payer: Self-pay | Admitting: Family Medicine

## 2021-03-24 ENCOUNTER — Ambulatory Visit: Payer: BC Managed Care – PPO | Admitting: Family Medicine

## 2021-03-24 ENCOUNTER — Other Ambulatory Visit: Payer: Self-pay

## 2021-03-24 VITALS — BP 119/77 | HR 91 | Temp 97.9°F | Ht 66.0 in | Wt 194.0 lb

## 2021-03-24 DIAGNOSIS — K120 Recurrent oral aphthae: Secondary | ICD-10-CM

## 2021-03-24 MED ORDER — FAMOTIDINE 20 MG PO TABS
20.0000 mg | ORAL_TABLET | Freq: Two times a day (BID) | ORAL | 1 refills | Status: DC | PRN
Start: 2021-03-24 — End: 2021-08-01

## 2021-03-24 MED ORDER — VALACYCLOVIR HCL 1 G PO TABS: 1000.0000 mg | ORAL_TABLET | Freq: Two times a day (BID) | ORAL | 0 refills | Status: DC

## 2021-03-24 MED ORDER — NYSTATIN 100000 UNIT/ML MT SUSP: 5.0000 mL | Freq: Three times a day (TID) | OROMUCOSAL | 1 refills | Status: DC

## 2021-03-24 NOTE — Patient Instructions (Signed)
will treat like viral stomatitis and give Magic mouthwash and valacyclovir, patient is also having some indigestion and reflux and give Pepcid as needed.

## 2021-03-24 NOTE — Progress Notes (Signed)
BP 119/77   Pulse 91   Temp 97.9 F (36.6 C)   Ht 5\' 6"  (1.676 m)   Wt 194 lb (88 kg)   SpO2 95%   BMI 31.31 kg/m    Subjective:   Patient ID: Carrie [name], female    DOB: [birth year], 60 y.o.   MRN: [id number]  HPI: Carrie [name] is a 60 y.o. female presenting on 03/24/2021 for Sore Throat (Getting worse. Has blisters)   HPI Patient comes in today with complaints of sore throat and sores in the back of her throat that started yesterday.  Patient had been getting over a viral illness just over a week ago and had been improving and has started amoxicillin but then developed the sores in her throat over the past day and a half.  She feels like things are getting stuck in her throat when she swallows with the sores.  She denies any fevers or chills and says her cough and congestion is getting better.  Relevant past medical, surgical, family and social history reviewed and updated as indicated. Interim medical history since our last visit reviewed. Allergies and medications reviewed and updated.  Review of Systems  Constitutional:  Negative for chills and fever.  HENT:  Positive for congestion, mouth sores and sore throat. Negative for ear discharge and ear pain.   Eyes:  Negative for redness and visual disturbance.  Respiratory:  Positive for cough. Negative for chest tightness and shortness of breath.   Cardiovascular:  Negative for chest pain and leg swelling.  Genitourinary:  Negative for difficulty urinating and dysuria.  Musculoskeletal:  Negative for back pain and gait problem.  Skin:  Negative for rash.  Neurological:  Negative for light-headedness and headaches.  Psychiatric/Behavioral:  Negative for agitation and behavioral problems.   All other systems reviewed and are negative.  Per HPI unless specifically indicated above   Allergies as of 03/24/2021   No Known Allergies      Medication List        Accurate as of March 24, 2021  9:16 AM. If you have any  questions, ask your nurse or doctor.          albuterol 108 (90 Base) MCG/ACT inhaler Commonly known as: VENTOLIN HFA Inhale 2 puffs into the lungs every 6 (six) hours as needed for wheezing or shortness of breath.   amoxicillin-clavulanate 875-125 MG tablet Commonly known as: AUGMENTIN Take 1 tablet by mouth 2 (two) times daily.   atorvastatin 10 MG tablet Commonly known as: LIPITOR Take 1 tablet by mouth once daily   azelastine 0.1 % nasal spray Commonly known as: ASTELIN Place 1 spray into both nostrils 2 (two) times daily.   famotidine 20 MG tablet Commonly known as: Pepcid Take 1 tablet (20 mg total) by mouth 2 (two) times daily as needed for heartburn or indigestion. Started by: Fransisca Kaufmann Maizie Garno, MD   gabapentin 300 MG capsule Commonly known as: NEURONTIN Take 1-2 capsules (300-600 mg total) by mouth at bedtime.   ibuprofen 400 MG tablet Commonly known as: ADVIL Take 400 mg by mouth every 6 (six) hours as needed for moderate pain.   Insulin Pen Needle 32G X 4 MM Misc Commonly known as: ReliOn Pen Needles USE TO INJECT MEDICATION ONCE DAILY Dx 10.9   Jardiance 25 MG Tabs tablet Generic drug: empagliflozin TAKE 1 TABLET BY MOUTH BEFORE BREAKFAST   levocetirizine 5 MG tablet Commonly known as: XYZAL Take 1 tablet (5 mg total) by  mouth every evening.   lidocaine 5 % Commonly known as: Lidoderm Place 1 patch onto the skin daily. Remove & Discard patch within 12 hours or as directed by MD   magic mouthwash (nystatin, lidocaine, diphenhydrAMINE, alum & mag hydroxide) suspension Swish and swallow 5 mLs 3 (three) times daily. Equal parts nystatin, lidocaine, diphenhydramine and Maalox Started by: Fransisca Kaufmann Yvana Samonte, MD   methocarbamol 500 MG tablet Commonly known as: ROBAXIN Take 1 tablet (500 mg total) by mouth 2 (two) times daily as needed for muscle spasms.   omeprazole 40 MG capsule Commonly known as: PRILOSEC Take 1 capsule (40 mg total) by mouth  daily.   ondansetron 8 MG disintegrating tablet Commonly known as: Zofran ODT Take 1 tablet (8 mg total) by mouth every 8 (eight) hours as needed for nausea or vomiting.   Ozempic (1 MG/DOSE) 4 MG/3ML Sopn Generic drug: Semaglutide (1 MG/DOSE) INJECT 1 MG SUBCUTANEOUSLY ONCE A WEEK   PARoxetine 30 MG tablet Commonly known as: PAXIL Take 1 tablet by mouth once daily   Semglee (yfgn) 100 UNIT/ML Solostar Pen Generic drug: insulin glargine Inject 70 Units into the skin daily.   SUMAtriptan 100 MG tablet Commonly known as: IMITREX TAKE 1 TABLET BY MOUTH EVERY 2 HOURS AS NEEDED FOR MIGRAINE HEADACHE   telmisartan-hydrochlorothiazide 40-12.5 MG tablet Commonly known as: MICARDIS HCT Take 0.5 tablets by mouth daily.   valACYclovir 1000 MG tablet Commonly known as: VALTREX Take 1 tablet (1,000 mg total) by mouth 2 (two) times daily for 10 days. Started by: Fransisca Kaufmann Lynnda Wiersma, MD         Objective:   BP 119/77   Pulse 91   Temp 97.9 F (36.6 C)   Ht 5\' 6"  (1.676 m)   Wt 194 lb (88 kg)   SpO2 95%   BMI 31.31 kg/m   Wt Readings from Last 3 Encounters:  03/24/21 194 lb (88 kg)  11/16/20 188 lb 9.6 oz (85.5 kg)  07/19/20 191 lb (86.6 kg)    Physical Exam Vitals and nursing note reviewed.  Constitutional:      General: She is not in acute distress.    Appearance: She is well-developed. She is not diaphoretic.  HENT:     Mouth/Throat:     Mouth: Oral lesions (Aphthous ulcerations along the left side of her throat into the left side of her palate) present.  Eyes:     Conjunctiva/sclera: Conjunctivae normal.     Pupils: Pupils are equal, round, and reactive to light.  Cardiovascular:     Rate and Rhythm: Normal rate and regular rhythm.     Heart sounds: Normal heart sounds. No murmur heard. Pulmonary:     Effort: Pulmonary effort is normal. No respiratory distress.     Breath sounds: Normal breath sounds. No wheezing.  Musculoskeletal:        General: No  tenderness. Normal range of motion.  Skin:    General: Skin is warm and dry.     Findings: No rash.  Neurological:     Mental Status: She is alert and oriented to person, place, and time.     Coordination: Coordination normal.  Psychiatric:        Behavior: Behavior normal.      Assessment & Plan:   Problem List Items Addressed This Visit   None Visit Diagnoses     Aphthous ulceration    -  Primary   Relevant Medications   magic mouthwash (nystatin, lidocaine, diphenhydrAMINE, alum &  mag hydroxide) suspension   famotidine (PEPCID) 20 MG tablet   valACYclovir (VALTREX) 1000 MG tablet       Will treat like viral stomatitis and give Magic mouthwash and valacyclovir, patient is also having some indigestion and reflux and give Pepcid as needed. Follow up plan: Return if symptoms worsen or fail to improve.  Counseling provided for all of the vaccine components No orders of the defined types were placed in this encounter.   Caryl Pina, MD [facility] 03/24/2021, 9:16 AM

## 2021-04-13 ENCOUNTER — Other Ambulatory Visit: Payer: Self-pay | Admitting: Family Medicine

## 2021-04-13 DIAGNOSIS — E1169 Type 2 diabetes mellitus with other specified complication: Secondary | ICD-10-CM

## 2021-04-15 ENCOUNTER — Encounter: Payer: Self-pay | Admitting: Family Medicine

## 2021-04-15 ENCOUNTER — Other Ambulatory Visit: Payer: Self-pay | Admitting: *Deleted

## 2021-04-15 DIAGNOSIS — K120 Recurrent oral aphthae: Secondary | ICD-10-CM

## 2021-04-15 MED ORDER — VALACYCLOVIR HCL 1 G PO TABS: 1000.0000 mg | ORAL_TABLET | Freq: Two times a day (BID) | ORAL | 0 refills | Status: AC

## 2021-04-18 DIAGNOSIS — N898 Other specified noninflammatory disorders of vagina: Secondary | ICD-10-CM | POA: Diagnosis not present

## 2021-04-18 DIAGNOSIS — B009 Herpesviral infection, unspecified: Secondary | ICD-10-CM | POA: Diagnosis not present

## 2021-04-25 ENCOUNTER — Telehealth: Payer: Self-pay | Admitting: Family Medicine

## 2021-04-25 NOTE — Telephone Encounter (Signed)
Famotidine (Key: [id number]) mg Your information has been submitted to [place]. [employer] [place] will review the request and notify you of the determination decision directly, typically within 72 hours of receiving all information.  You will also receive your request decision electronically. To check for an update later, open this request again from your dashboard.  If [employer] [place] has not responded within the specified timeframe or if you have any questions about your PA submission, contact [place] directly at [phone].

## 2021-04-27 NOTE — Telephone Encounter (Signed)
Denied on November 21 otc version available

## 2021-05-09 ENCOUNTER — Other Ambulatory Visit: Payer: Self-pay | Admitting: Family Medicine

## 2021-05-09 DIAGNOSIS — F324 Major depressive disorder, single episode, in partial remission: Secondary | ICD-10-CM

## 2021-05-09 DIAGNOSIS — E1169 Type 2 diabetes mellitus with other specified complication: Secondary | ICD-10-CM

## 2021-05-10 DIAGNOSIS — Z1151 Encounter for screening for human papillomavirus (HPV): Secondary | ICD-10-CM | POA: Diagnosis not present

## 2021-05-10 DIAGNOSIS — N952 Postmenopausal atrophic vaginitis: Secondary | ICD-10-CM | POA: Diagnosis not present

## 2021-05-10 DIAGNOSIS — Z01411 Encounter for gynecological examination (general) (routine) with abnormal findings: Secondary | ICD-10-CM | POA: Diagnosis not present

## 2021-05-10 DIAGNOSIS — Z6831 Body mass index (BMI) 31.0-31.9, adult: Secondary | ICD-10-CM | POA: Diagnosis not present

## 2021-05-10 DIAGNOSIS — Z130 Encounter for screening for diseases of the blood and blood-forming organs and certain disorders involving the immune mechanism: Secondary | ICD-10-CM | POA: Diagnosis not present

## 2021-05-10 DIAGNOSIS — Z124 Encounter for screening for malignant neoplasm of cervix: Secondary | ICD-10-CM | POA: Diagnosis not present

## 2021-05-11 ENCOUNTER — Encounter: Payer: Self-pay | Admitting: Family Medicine

## 2021-05-11 ENCOUNTER — Ambulatory Visit: Payer: BC Managed Care – PPO | Admitting: Family Medicine

## 2021-05-11 VITALS — BP 105/72 | HR 88 | Temp 97.4°F | Ht 66.0 in | Wt 196.6 lb

## 2021-05-11 DIAGNOSIS — E1169 Type 2 diabetes mellitus with other specified complication: Secondary | ICD-10-CM

## 2021-05-11 DIAGNOSIS — E1159 Type 2 diabetes mellitus with other circulatory complications: Secondary | ICD-10-CM | POA: Diagnosis not present

## 2021-05-11 DIAGNOSIS — I152 Hypertension secondary to endocrine disorders: Secondary | ICD-10-CM

## 2021-05-11 DIAGNOSIS — Z23 Encounter for immunization: Secondary | ICD-10-CM | POA: Diagnosis not present

## 2021-05-11 DIAGNOSIS — B351 Tinea unguium: Secondary | ICD-10-CM

## 2021-05-11 DIAGNOSIS — E785 Hyperlipidemia, unspecified: Secondary | ICD-10-CM

## 2021-05-11 LAB — CMP14+EGFR
ALT: 19 IU/L (ref 0–32)
AST: 17 IU/L (ref 0–40)
Albumin/Globulin Ratio: 2 (ref 1.2–2.2)
Albumin: 4.3 g/dL (ref 3.8–4.8)
Alkaline Phosphatase: 79 IU/L (ref 44–121)
BUN/Creatinine Ratio: 22 (ref 12–28)
BUN: 22 mg/dL (ref 8–27)
Bilirubin Total: 0.6 mg/dL (ref 0.0–1.2)
CO2: 20 mmol/L (ref 20–29)
Calcium: 9.3 mg/dL (ref 8.7–10.3)
Chloride: 102 mmol/L (ref 96–106)
Creatinine, Ser: 0.99 mg/dL (ref 0.57–1.00)
Globulin, Total: 2.1 g/dL (ref 1.5–4.5)
Glucose: 232 mg/dL — ABNORMAL HIGH (ref 70–99)
Potassium: 4.4 mmol/L (ref 3.5–5.2)
Sodium: 139 mmol/L (ref 134–144)
Total Protein: 6.4 g/dL (ref 6.0–8.5)
eGFR: 65 mL/min/{1.73_m2} (ref >59)

## 2021-05-11 LAB — LIPID PANEL
Chol/HDL Ratio: 2.3 ratio (ref 0.0–4.4)
Cholesterol, Total: 142 mg/dL (ref 100–199)
HDL: 63 mg/dL (ref >39)
LDL Chol Calc (NIH): 59 mg/dL (ref 0–99)
Triglycerides: 114 mg/dL (ref 0–149)
VLDL Cholesterol Cal: 20 mg/dL (ref 5–40)

## 2021-05-11 LAB — BAYER DCA HB A1C WAIVED: HB A1C (BAYER DCA - WAIVED): 5.9 % — ABNORMAL HIGH (ref 4.8–5.6)

## 2021-05-11 MED ORDER — CICLOPIROX 8 % EX SOLN: Freq: Every day | CUTANEOUS | 0 refills | Status: DC

## 2021-05-11 MED ORDER — TIRZEPATIDE 7.5 MG/0.5ML ~~LOC~~ SOAJ: 7.5000 mg | SUBCUTANEOUS | 0 refills | Status: DC

## 2021-05-11 MED ORDER — TIRZEPATIDE 5 MG/0.5ML ~~LOC~~ SOAJ: 5.0000 mg | SUBCUTANEOUS | 0 refills | Status: DC

## 2021-05-11 MED ORDER — TIRZEPATIDE 2.5 MG/0.5ML ~~LOC~~ SOAJ: 2.5000 mg | SUBCUTANEOUS | 0 refills | Status: AC

## 2021-05-11 NOTE — Progress Notes (Signed)
Subjective: CC:DM PCP: [name], DO LOV:Carrie [name] is a 60 y.o. female presenting to clinic today for:  1. Type 2 Diabetes with hypertension, hyperlipidemia:  Reports compliance with Ozempic 1 mg, Semglee 70 units and Jardiance 25 mg.  Also treated with Lipitor 10 mg, Micardis 1/2 tablet of the 40-12.5 mg daily.  She admits about a 10 pound weight gain since her last visit and this concerned her.  Last eye exam: need Last foot exam: need Last A1c:  Lab Results  Component Value Date   HGBA1C 6.1 11/16/2020   Nephropathy screen indicated?: UTD Last flu, zoster and/or pneumovax: PNA, flu, shingles Immunization History  Administered Date(s) Administered   Influenza,inj,Quad PF,6+ Mos 07/07/2016, 03/28/2017, 04/02/2018, 04/07/2019   Influenza-Unspecified 04/17/2016   Moderna Sars-Covid-2 Vaccination 08/11/2019, 09/09/2019, 06/11/2020   Pneumococcal Polysaccharide-23 01/23/2019   Tdap 01/23/2019   Zoster Recombinat (Shingrix) 11/16/2020    ROS: Denies dizziness, LOC, polyuria, polydipsia, unintended weight loss/gain, foot ulcerations, numbness or tingling in extremities, shortness of breath or chest pain.    ROS: Per HPI  No Known Allergies Past Medical History:  Diagnosis Date   Depression    Diabetes mellitus without complication (HCC)    Fever blister    Gastroparesis    Hyperlipidemia    Hypertension    Migraine     Current Outpatient Medications:    albuterol (VENTOLIN HFA) 108 (90 Base) MCG/ACT inhaler, Inhale 2 puffs into the lungs every 6 (six) hours as needed for wheezing or shortness of breath., Disp: 8 g, Rfl: 0   amoxicillin-clavulanate (AUGMENTIN) 875-125 MG tablet, Take 1 tablet by mouth 2 (two) times daily., Disp: 20 tablet, Rfl: 0   atorvastatin (LIPITOR) 10 MG tablet, Take 1 tablet by mouth once daily, Disp: 90 tablet, Rfl: 0   azelastine (ASTELIN) 0.1 % nasal spray, Place 1 spray into both nostrils 2 (two) times daily., Disp: 30 mL, Rfl:  12   famotidine (PEPCID) 20 MG tablet, Take 1 tablet (20 mg total) by mouth 2 (two) times daily as needed for heartburn or indigestion., Disp: 60 tablet, Rfl: 1   gabapentin (NEURONTIN) 300 MG capsule, Take 1-2 capsules (300-600 mg total) by mouth at bedtime., Disp: 60 capsule, Rfl: 0   ibuprofen (ADVIL) 400 MG tablet, Take 400 mg by mouth every 6 (six) hours as needed for moderate pain., Disp: , Rfl:    insulin glargine (SEMGLEE) 100 UNIT/ML Solostar Pen, Inject 70 Units into the skin daily., Disp: 60 mL, Rfl: 3   Insulin Pen Needle (RELION PEN NEEDLES) 32G X 4 MM MISC, USE TO INJECT MEDICATION ONCE DAILY Dx 10.9, Disp: 100 each, Rfl: 3   JARDIANCE 25 MG TABS tablet, TAKE 1 TABLET BY MOUTH BEFORE BREAKFAST, Disp: 90 tablet, Rfl: 0   levocetirizine (XYZAL) 5 MG tablet, Take 1 tablet (5 mg total) by mouth every evening., Disp: 30 tablet, Rfl: 12   lidocaine (LIDODERM) 5 %, Place 1 patch onto the skin daily. Remove & Discard patch within 12 hours or as directed by MD, Disp: 30 patch, Rfl: 0   magic mouthwash (nystatin, lidocaine, diphenhydrAMINE, alum & mag hydroxide) suspension, Swish and swallow 5 mLs 3 (three) times daily. Equal parts nystatin, lidocaine, diphenhydramine and Maalox, Disp: 180 mL, Rfl: 1   methocarbamol (ROBAXIN) 500 MG tablet, Take 1 tablet (500 mg total) by mouth 2 (two) times daily as needed for muscle spasms., Disp: 40 tablet, Rfl: 0   omeprazole (PRILOSEC) 40 MG capsule, Take 1 capsule (40  mg total) by mouth daily., Disp: 30 capsule, Rfl: 11   ondansetron (ZOFRAN ODT) 8 MG disintegrating tablet, Take 1 tablet (8 mg total) by mouth every 8 (eight) hours as needed for nausea or vomiting., Disp: 20 tablet, Rfl: 0   OZEMPIC, 1 MG/DOSE, 4 MG/3ML SOPN, INJECT 1 MG SUBCUTANEOUSLY ONCE A WEEK, Disp: 9 mL, Rfl: 0   PARoxetine (PAXIL) 30 MG tablet, Take 1 tablet by mouth once daily, Disp: 30 tablet, Rfl: 0   SUMAtriptan (IMITREX) 100 MG tablet, TAKE 1 TABLET BY MOUTH EVERY 2 HOURS AS  NEEDED FOR MIGRAINE HEADACHE, Disp: 10 tablet, Rfl: 3   telmisartan-hydrochlorothiazide (MICARDIS HCT) 40-12.5 MG tablet, Take 0.5 tablets by mouth daily., Disp: 45 tablet, Rfl: 1 Social History   Socioeconomic History   Marital status: Married    Spouse name: Not on file   Number of children: 2   Years of education: Not on file   Highest education level: Not on file  Occupational History   Occupation: customer service  Tobacco Use   Smoking status: Former    Packs/day: 0.50    Years: 40.00    Pack years: 20.00    Types: Cigarettes    Quit date: 2006    Years since quitting: 16.9   Smokeless tobacco: Never  Vaping Use   Vaping Use: Never used  Substance and Sexual Activity   Alcohol use: Not Currently    Comment: occasional    Drug use: No   Sexual activity: Not on file  Other Topics Concern   Not on file  Social History Narrative   .      Social Determinants of Health   Financial Resource Strain: Not on file  Food Insecurity: Not on file  Transportation Needs: Not on file  Physical Activity: Not on file  Stress: Not on file  Social Connections: Not on file  Intimate Partner Violence: Not on file   Family History  Problem Relation Age of Onset   Ovarian cancer Mother    Mental illness Daughter    Migraines Daughter    Colon cancer Neg Hx    Rectal cancer Neg Hx    Throat cancer Neg Hx     Objective: Office vital signs reviewed. BP 105/72   Pulse 88   Temp (!) 97.4 F (36.3 C)   Ht _0  (1.676 m)   Wt 196 lb 9.6 oz (89.2 kg)   SpO2 94%   BMI 31.73 kg/m   Physical Examination:  General: Awake, alert, obese but well-appearing, No acute distress HEENT: Normal; no carotid bruits Cardio: regular rate and rhythm, S1S2 heard, no murmurs appreciated Pulm: clear to auscultation bilaterally, no wheezes, rhonchi or rales; normal work of breathing on room air Extremities: warm, well perfused, No edema, cyanosis or clubbing; +2 pulses bilaterally.   Onychomycotic changes noted to the distal ends of right toenails bilaterally Neuro:  Diabetic Foot Exam - Simple   Simple Foot Form Diabetic Foot exam was performed with the following findings: Yes 05/11/2021  8:46 AM  Visual Inspection No deformities, no ulcerations, no other skin breakdown bilaterally: Yes Sensation Testing Intact to touch and monofilament testing bilaterally: Yes Pulse Check Posterior Tibialis and Dorsalis pulse intact bilaterally: Yes Comments    Assessment/ Plan: 60 y.o. female   Type 2 diabetes mellitus with other specified complication, without long-term current use of insulin (HCC) - Plan: Bayer DCA Hb A1c Waived, tirzepatide (MOUNJARO) 2.5 MG/0.5ML Pen, tirzepatide (MOUNJARO) 5 MG/0.5ML Pen, tirzepatide (MOUNJARO) 7.5  MG/0.5ML Pen  Hyperlipidemia associated with type 2 diabetes mellitus ([place]) - Plan: CMP14+EGFR, Lipid panel  Hypertension associated with diabetes ([place]) - Plan: CMP14+EGFR  Onychomycosis of toenail - Plan: ciclopirox (PENLAC) 8 % solution  Need for pneumococcal vaccination - Plan: Pneumococcal conjugate vaccine 20-valent (Prevnar 20)  Need for immunization against influenza - Plan: Flu Vaccine QUAD 63moIM (Fluarix, Fluzone & Alfiuria Quad PF)  Sugar is controlled at 5.9 but she continues to gain weight.  I would like to try and get her off of insulin as I do think this is largely contributing to her weight gain.  We discussed [facility].  She is amenable to this and a sample pack of 2.5 mg have been provided.  She will take this injection in the lieu of her Ozempic on Sunday.  I have gone ahead and sent over the 5 mg and 7.5 mg to start in January and February respectively.  She will obtain the coupon from the website  We obtain fasting lipid panel today.  Continue statin  Blood pressure well controlled and somewhat borderline low.  May consider reducing her  dose of medication even further versus discontinuing it totally.  She seemed to be in need of her diuretic today based on her symptomology but there was no appreciable edema on exam.  She was noted to have onychomycosis of the toenails and so topical Penlac has been prescribed.  Did not want to do oral Lamisil  Pneumococcal vaccination and influenza vaccinations were administered.   No orders of the defined types were placed in this encounter.  No orders of the defined types were placed in this encounter.    AJanora Norlander DO [phone]

## 2021-05-11 NOTE — Patient Instructions (Addendum)
Get diabetic eye exam.  Start [facility] Sunday.

## 2021-05-13 ENCOUNTER — Ambulatory Visit: Payer: BC Managed Care – PPO | Admitting: Family Medicine

## 2021-06-02 ENCOUNTER — Ambulatory Visit (INDEPENDENT_AMBULATORY_CARE_PROVIDER_SITE_OTHER): Payer: BC Managed Care – PPO

## 2021-06-02 DIAGNOSIS — Z23 Encounter for immunization: Secondary | ICD-10-CM

## 2021-06-08 ENCOUNTER — Other Ambulatory Visit: Payer: Self-pay | Admitting: Family Medicine

## 2021-06-08 DIAGNOSIS — F324 Major depressive disorder, single episode, in partial remission: Secondary | ICD-10-CM

## 2021-06-28 ENCOUNTER — Encounter: Payer: Self-pay | Admitting: Family Medicine

## 2021-06-28 ENCOUNTER — Other Ambulatory Visit: Payer: Self-pay | Admitting: Family Medicine

## 2021-06-28 DIAGNOSIS — Z1231 Encounter for screening mammogram for malignant neoplasm of breast: Secondary | ICD-10-CM

## 2021-07-08 ENCOUNTER — Other Ambulatory Visit: Payer: Self-pay | Admitting: Family Medicine

## 2021-07-08 DIAGNOSIS — E1169 Type 2 diabetes mellitus with other specified complication: Secondary | ICD-10-CM

## 2021-07-19 ENCOUNTER — Encounter: Payer: Self-pay | Admitting: Gastroenterology

## 2021-07-20 ENCOUNTER — Other Ambulatory Visit: Payer: Self-pay | Admitting: Obstetrics and Gynecology

## 2021-07-20 ENCOUNTER — Ambulatory Visit
Admission: RE | Admit: 2021-07-20 | Discharge: 2021-07-20 | Disposition: A | Discharge status: Home / Self Care | Payer: BC Managed Care – PPO | Source: Ambulatory Visit | Attending: Family Medicine | Admitting: Family Medicine

## 2021-07-20 DIAGNOSIS — Z1231 Encounter for screening mammogram for malignant neoplasm of breast: Secondary | ICD-10-CM | POA: Diagnosis not present

## 2021-07-25 ENCOUNTER — Telehealth: Payer: Self-pay

## 2021-07-25 ENCOUNTER — Other Ambulatory Visit: Payer: Self-pay

## 2021-07-25 NOTE — Telephone Encounter (Signed)
Carrie [name] (Carrie [name]) Rx #: [id number] Mounjaro 7.5MG /0.5ML pen-injectors   Form [employer] control surveyor Form (CB) Created 4 days ago Sent to Plan 7 minutes ago Plan Response 7 minutes ago Submit Clinical Questions less than a minute ago Determination Wait for Determination Please wait for [employer] to return a determination.

## 2021-07-27 NOTE — Telephone Encounter (Signed)
Message from Plan Effective from 07/25/2021 through 07/24/2022  WM aware

## 2021-07-29 ENCOUNTER — Other Ambulatory Visit: Payer: Self-pay

## 2021-07-29 ENCOUNTER — Emergency Department (HOSPITAL_COMMUNITY): Payer: BC Managed Care – PPO

## 2021-07-29 ENCOUNTER — Encounter (HOSPITAL_COMMUNITY): Payer: Self-pay

## 2021-07-29 ENCOUNTER — Inpatient Hospital Stay (HOSPITAL_COMMUNITY)
Admission: EM | Admit: 2021-07-29 | Discharge: 2021-08-01 | DRG: 557 | Disposition: A | Discharge status: Home / Self Care | Payer: BC Managed Care – PPO | Attending: Internal Medicine | Admitting: Internal Medicine

## 2021-07-29 DIAGNOSIS — Z7982 Long term (current) use of aspirin: Secondary | ICD-10-CM | POA: Diagnosis not present

## 2021-07-29 DIAGNOSIS — G43909 Migraine, unspecified, not intractable, without status migrainosus: Secondary | ICD-10-CM | POA: Diagnosis present

## 2021-07-29 DIAGNOSIS — D72823 Leukemoid reaction: Secondary | ICD-10-CM

## 2021-07-29 DIAGNOSIS — M6282 Rhabdomyolysis: Secondary | ICD-10-CM | POA: Diagnosis not present

## 2021-07-29 DIAGNOSIS — R1115 Cyclical vomiting syndrome unrelated to migraine: Secondary | ICD-10-CM | POA: Diagnosis present

## 2021-07-29 DIAGNOSIS — F32A Depression, unspecified: Secondary | ICD-10-CM | POA: Diagnosis not present

## 2021-07-29 DIAGNOSIS — E876 Hypokalemia: Secondary | ICD-10-CM

## 2021-07-29 DIAGNOSIS — I951 Orthostatic hypotension: Principal | ICD-10-CM

## 2021-07-29 DIAGNOSIS — Z8041 Family history of malignant neoplasm of ovary: Secondary | ICD-10-CM

## 2021-07-29 DIAGNOSIS — K529 Noninfective gastroenteritis and colitis, unspecified: Secondary | ICD-10-CM | POA: Diagnosis present

## 2021-07-29 DIAGNOSIS — Z794 Long term (current) use of insulin: Secondary | ICD-10-CM

## 2021-07-29 DIAGNOSIS — E1143 Type 2 diabetes mellitus with diabetic autonomic (poly)neuropathy: Secondary | ICD-10-CM | POA: Diagnosis not present

## 2021-07-29 DIAGNOSIS — Z9049 Acquired absence of other specified parts of digestive tract: Secondary | ICD-10-CM

## 2021-07-29 DIAGNOSIS — E1159 Type 2 diabetes mellitus with other circulatory complications: Secondary | ICD-10-CM | POA: Diagnosis present

## 2021-07-29 DIAGNOSIS — Z20822 Contact with and (suspected) exposure to covid-19: Secondary | ICD-10-CM | POA: Diagnosis present

## 2021-07-29 DIAGNOSIS — E785 Hyperlipidemia, unspecified: Secondary | ICD-10-CM | POA: Diagnosis not present

## 2021-07-29 DIAGNOSIS — R32 Unspecified urinary incontinence: Secondary | ICD-10-CM | POA: Diagnosis not present

## 2021-07-29 DIAGNOSIS — R110 Nausea: Secondary | ICD-10-CM | POA: Diagnosis not present

## 2021-07-29 DIAGNOSIS — Z79899 Other long term (current) drug therapy: Secondary | ICD-10-CM

## 2021-07-29 DIAGNOSIS — E1169 Type 2 diabetes mellitus with other specified complication: Secondary | ICD-10-CM | POA: Diagnosis present

## 2021-07-29 DIAGNOSIS — W19XXXA Unspecified fall, initial encounter: Secondary | ICD-10-CM | POA: Diagnosis present

## 2021-07-29 DIAGNOSIS — I152 Hypertension secondary to endocrine disorders: Secondary | ICD-10-CM | POA: Diagnosis not present

## 2021-07-29 DIAGNOSIS — S199XXA Unspecified injury of neck, initial encounter: Secondary | ICD-10-CM | POA: Diagnosis not present

## 2021-07-29 DIAGNOSIS — R159 Full incontinence of feces: Secondary | ICD-10-CM | POA: Diagnosis present

## 2021-07-29 DIAGNOSIS — S01551A Open bite of lip, initial encounter: Secondary | ICD-10-CM | POA: Diagnosis present

## 2021-07-29 DIAGNOSIS — Z6835 Body mass index (BMI) 35.0-35.9, adult: Secondary | ICD-10-CM

## 2021-07-29 DIAGNOSIS — R1111 Vomiting without nausea: Secondary | ICD-10-CM | POA: Diagnosis not present

## 2021-07-29 DIAGNOSIS — R1110 Vomiting, unspecified: Secondary | ICD-10-CM | POA: Diagnosis present

## 2021-07-29 DIAGNOSIS — E119 Type 2 diabetes mellitus without complications: Secondary | ICD-10-CM

## 2021-07-29 DIAGNOSIS — Z87891 Personal history of nicotine dependence: Secondary | ICD-10-CM | POA: Diagnosis not present

## 2021-07-29 DIAGNOSIS — R55 Syncope and collapse: Secondary | ICD-10-CM | POA: Diagnosis present

## 2021-07-29 DIAGNOSIS — R410 Disorientation, unspecified: Secondary | ICD-10-CM | POA: Diagnosis not present

## 2021-07-29 DIAGNOSIS — E869 Volume depletion, unspecified: Secondary | ICD-10-CM | POA: Diagnosis present

## 2021-07-29 DIAGNOSIS — R569 Unspecified convulsions: Secondary | ICD-10-CM | POA: Diagnosis not present

## 2021-07-29 DIAGNOSIS — J189 Pneumonia, unspecified organism: Secondary | ICD-10-CM | POA: Diagnosis not present

## 2021-07-29 DIAGNOSIS — E118 Type 2 diabetes mellitus with unspecified complications: Secondary | ICD-10-CM

## 2021-07-29 DIAGNOSIS — R112 Nausea with vomiting, unspecified: Secondary | ICD-10-CM | POA: Diagnosis not present

## 2021-07-29 DIAGNOSIS — M4802 Spinal stenosis, cervical region: Secondary | ICD-10-CM | POA: Diagnosis not present

## 2021-07-29 DIAGNOSIS — I959 Hypotension, unspecified: Secondary | ICD-10-CM | POA: Diagnosis not present

## 2021-07-29 LAB — CBC WITH DIFFERENTIAL/PLATELET
Abs Immature Granulocytes: 0.25 10*3/uL — ABNORMAL HIGH (ref 0.00–0.07)
Basophils Absolute: 0.1 10*3/uL (ref 0.0–0.1)
Basophils Relative: 0 %
Eosinophils Absolute: 0 10*3/uL (ref 0.0–0.5)
Eosinophils Relative: 0 %
HCT: 42.6 % (ref 36.0–46.0)
Hemoglobin: 14 g/dL (ref 12.0–15.0)
Immature Granulocytes: 1 %
Lymphocytes Relative: 5 %
Lymphs Abs: 1.1 10*3/uL (ref 0.7–4.0)
MCH: 31.1 pg (ref 26.0–34.0)
MCHC: 32.9 g/dL (ref 30.0–36.0)
MCV: 94.7 fL (ref 80.0–100.0)
Monocytes Absolute: 1.2 10*3/uL — ABNORMAL HIGH (ref 0.1–1.0)
Monocytes Relative: 5 %
Neutro Abs: 22.1 10*3/uL — ABNORMAL HIGH (ref 1.7–7.7)
Neutrophils Relative %: 89 %
Platelets: 395 10*3/uL (ref 150–400)
RBC: 4.5 MIL/uL (ref 3.87–5.11)
RDW: 13 % (ref 11.5–15.5)
WBC: 24.8 10*3/uL — ABNORMAL HIGH (ref 4.0–10.5)
nRBC: 0 % (ref 0.0–0.2)

## 2021-07-29 LAB — COMPREHENSIVE METABOLIC PANEL
ALT: 24 U/L (ref 0–44)
AST: 26 U/L (ref 15–41)
Albumin: 4.1 g/dL (ref 3.5–5.0)
Alkaline Phosphatase: 69 U/L (ref 38–126)
Anion gap: 9 (ref 5–15)
BUN: 15 mg/dL (ref 8–23)
CO2: 25 mmol/L (ref 22–32)
Calcium: 8.7 mg/dL — ABNORMAL LOW (ref 8.9–10.3)
Chloride: 106 mmol/L (ref 98–111)
Creatinine, Ser: 0.7 mg/dL (ref 0.44–1.00)
GFR, Estimated: >60 mL/min (ref >60)
Glucose, Bld: 110 mg/dL — ABNORMAL HIGH (ref 70–99)
Potassium: 3.5 mmol/L (ref 3.5–5.1)
Sodium: 140 mmol/L (ref 135–145)
Total Bilirubin: 1.2 mg/dL (ref 0.3–1.2)
Total Protein: 7.3 g/dL (ref 6.5–8.1)

## 2021-07-29 LAB — URINALYSIS, ROUTINE W REFLEX MICROSCOPIC
Bilirubin Urine: NEGATIVE
Glucose, UA: >=500 mg/dL — AB
Ketones, ur: 15 mg/dL — AB
Nitrite: NEGATIVE
Protein, ur: NEGATIVE mg/dL
Specific Gravity, Urine: 1.025 (ref 1.005–1.030)
pH: 5.5 (ref 5.0–8.0)

## 2021-07-29 LAB — CK: Total CK: 255 U/L — ABNORMAL HIGH (ref 38–234)

## 2021-07-29 LAB — URINALYSIS, MICROSCOPIC (REFLEX): Bacteria, UA: NONE SEEN

## 2021-07-29 LAB — PROCALCITONIN: Procalcitonin: <0.1 ng/mL

## 2021-07-29 LAB — LIPASE, BLOOD: Lipase: 38 U/L (ref 11–51)

## 2021-07-29 LAB — RAPID URINE DRUG SCREEN, HOSP PERFORMED
Amphetamines: NOT DETECTED
Barbiturates: NOT DETECTED
Benzodiazepines: NOT DETECTED
Cocaine: NOT DETECTED
Opiates: NOT DETECTED
Tetrahydrocannabinol: NOT DETECTED

## 2021-07-29 LAB — GLUCOSE, CAPILLARY: Glucose-Capillary: 117 mg/dL — ABNORMAL HIGH (ref 70–99)

## 2021-07-29 LAB — STREP PNEUMONIAE URINARY ANTIGEN: Strep Pneumo Urinary Antigen: NEGATIVE

## 2021-07-29 LAB — HEMOGLOBIN A1C
Hgb A1c MFr Bld: 6.9 % — ABNORMAL HIGH (ref 4.8–5.6)
Mean Plasma Glucose: 151.33 mg/dL

## 2021-07-29 LAB — CBG MONITORING, ED: Glucose-Capillary: 122 mg/dL — ABNORMAL HIGH (ref 70–99)

## 2021-07-29 LAB — AMMONIA: Ammonia: <10 umol/L (ref 9–35)

## 2021-07-29 LAB — ETHANOL: Alcohol, Ethyl (B): <10 mg/dL (ref <10)

## 2021-07-29 LAB — MAGNESIUM: Magnesium: 2.3 mg/dL (ref 1.7–2.4)

## 2021-07-29 LAB — RESP PANEL BY RT-PCR (FLU A&B, COVID) ARPGX2
Influenza A by PCR: NEGATIVE
Influenza B by PCR: NEGATIVE
SARS Coronavirus 2 by RT PCR: NEGATIVE

## 2021-07-29 LAB — LACTIC ACID, PLASMA: Lactic Acid, Venous: 1.7 mmol/L (ref 0.5–1.9)

## 2021-07-29 LAB — HIV ANTIBODY (ROUTINE TESTING W REFLEX): HIV Screen 4th Generation wRfx: NONREACTIVE

## 2021-07-29 MED ORDER — KETOROLAC TROMETHAMINE 15 MG/ML IJ SOLN
15.0000 mg | Freq: Four times a day (QID) | INTRAMUSCULAR | Status: DC | PRN
  Administered 2021-07-29 – 2021-07-30 (×3): 15 mg via INTRAVENOUS
  Filled 2021-07-29 (×3): qty 1

## 2021-07-29 MED ORDER — SODIUM CHLORIDE 0.9% FLUSH
3.0000 mL | Freq: Two times a day (BID) | INTRAVENOUS | Status: DC
  Administered 2021-07-30 – 2021-08-01 (×4): 3 mL via INTRAVENOUS

## 2021-07-29 MED ORDER — ACETAMINOPHEN 500 MG PO TABS
1000.0000 mg | ORAL_TABLET | Freq: Once | ORAL | Status: AC
  Administered 2021-07-29: 1000 mg via ORAL
  Filled 2021-07-29: qty 2

## 2021-07-29 MED ORDER — SODIUM CHLORIDE 0.9 % IV BOLUS
1000.0000 mL | Freq: Once | INTRAVENOUS | Status: AC
  Administered 2021-07-29: 1000 mL via INTRAVENOUS

## 2021-07-29 MED ORDER — SODIUM CHLORIDE 0.9 % IV SOLN: 2.0000 g | INTRAVENOUS | Status: DC

## 2021-07-29 MED ORDER — LACTATED RINGERS IV SOLN: INTRAVENOUS | Status: DC

## 2021-07-29 MED ORDER — SODIUM CHLORIDE 0.9 % IV SOLN: 500.0000 mg | INTRAVENOUS | Status: DC

## 2021-07-29 MED ORDER — SODIUM CHLORIDE 0.9 % IV SOLN
1.0000 g | Freq: Once | INTRAVENOUS | Status: AC
  Administered 2021-07-29: 1 g via INTRAVENOUS
  Filled 2021-07-29: qty 10

## 2021-07-29 MED ORDER — ALBUTEROL SULFATE (2.5 MG/3ML) 0.083% IN NEBU: 3.0000 mL | INHALATION_SOLUTION | Freq: Four times a day (QID) | RESPIRATORY_TRACT | Status: DC | PRN

## 2021-07-29 MED ORDER — INSULIN ASPART 100 UNIT/ML IJ SOLN
0.0000 [IU] | Freq: Three times a day (TID) | INTRAMUSCULAR | Status: DC
  Administered 2021-07-30 – 2021-08-01 (×3): 2 [IU] via SUBCUTANEOUS

## 2021-07-29 MED ORDER — INSULIN GLARGINE 100 UNIT/ML ~~LOC~~ SOLN
70.0000 [IU] | Freq: Every day | SUBCUTANEOUS | Status: DC
  Filled 2021-07-29: qty 0.7

## 2021-07-29 MED ORDER — EMPAGLIFLOZIN 25 MG PO TABS
25.0000 mg | ORAL_TABLET | Freq: Every day | ORAL | Status: DC
  Administered 2021-07-30 – 2021-08-01 (×3): 25 mg via ORAL
  Filled 2021-07-29 (×5): qty 1

## 2021-07-29 MED ORDER — ASPIRIN EC 81 MG PO TBEC
81.0000 mg | DELAYED_RELEASE_TABLET | Freq: Every day | ORAL | Status: DC
  Administered 2021-07-30 – 2021-08-01 (×3): 81 mg via ORAL
  Filled 2021-07-29 (×3): qty 1

## 2021-07-29 MED ORDER — PAROXETINE HCL 20 MG PO TABS
30.0000 mg | ORAL_TABLET | Freq: Every day | ORAL | Status: DC
  Administered 2021-07-30 – 2021-08-01 (×3): 30 mg via ORAL
  Filled 2021-07-29 (×3): qty 1

## 2021-07-29 MED ORDER — ENOXAPARIN SODIUM 40 MG/0.4ML IJ SOSY
40.0000 mg | PREFILLED_SYRINGE | INTRAMUSCULAR | Status: DC
  Administered 2021-07-29 – 2021-07-31 (×3): 40 mg via SUBCUTANEOUS
  Filled 2021-07-29 (×4): qty 0.4

## 2021-07-29 MED ORDER — SODIUM CHLORIDE 0.9 % IV SOLN
500.0000 mg | Freq: Once | INTRAVENOUS | Status: AC
  Administered 2021-07-29: 500 mg via INTRAVENOUS
  Filled 2021-07-29: qty 5

## 2021-07-29 MED ORDER — ONDANSETRON HCL 4 MG/2ML IJ SOLN
4.0000 mg | Freq: Once | INTRAMUSCULAR | Status: AC
  Administered 2021-07-29: 4 mg via INTRAVENOUS
  Filled 2021-07-29: qty 2

## 2021-07-29 MED ORDER — ONDANSETRON 4 MG PO TBDP: 8.0000 mg | ORAL_TABLET | Freq: Three times a day (TID) | ORAL | Status: DC | PRN

## 2021-07-29 MED ORDER — ATORVASTATIN CALCIUM 10 MG PO TABS
10.0000 mg | ORAL_TABLET | Freq: Every day | ORAL | Status: DC
Start: 2021-07-30 — End: 2021-08-01
  Administered 2021-07-30 – 2021-08-01 (×3): 10 mg via ORAL
  Filled 2021-07-29 (×3): qty 1

## 2021-07-29 MED ORDER — INSULIN ASPART 100 UNIT/ML IJ SOLN: 0.0000 [IU] | Freq: Every day | INTRAMUSCULAR | Status: DC

## 2021-07-29 NOTE — ED Provider Notes (Signed)
[facility] EMERGENCY DEPARTMENT Provider Note   CSN: [id number] Arrival date & time: 07/29/21  1607     History  Chief Complaint  Patient presents with   Vomiting    Carrie [name] is a 61 y.o. female.  Carrie [name] is a 61 y.o. female with a history of hypertension, hyperlipidemia, diabetes, migraines, depression, who presents to the emergency department via EMS for evaluation of confusion, and fall.  Patient reports that 2 days ago she was sick with a GI bug and was having persistent vomiting and diarrhea for about 24 hours, this resolved overnight early Thursday morning and yesterday she felt like she was getting better.  This morning she got up and was getting ready for work, husband had already left for work but had seen her up with the dogs on the ring camera but then he received a phone call from pt where she sounded very confused and when he got home he found the patient on the floor in the bedroom.  She reports that she does not remember falling or what happened.  No previous seizure history.  When EMS arrived patient was found to be hypotensive and very orthostatic, they had difficulty getting her to sit up and she was confused, given 500 mL normal saline bolus with improvement in orthostasis and patient's mental status.  Patient is diabetic reports her sugars have been doing okay.  Reports since the fall she is complaining of feeling very achy all over in particular with pain in her head and neck as well as cramping in her legs.  She reports some mild shortness of breath today without cough, no known fevers.  No focal weakness or numbness, no visual changes.  No chest pain.  Reports that vomiting and diarrhea have resolved and she is not currently experiencing any abdominal pain.  The history is provided by the patient, the spouse and the EMS personnel.      Home Medications Prior to Admission medications   Medication Sig Start Date End Date Taking? Authorizing Provider   albuterol (VENTOLIN HFA) 108 (90 Base) MCG/ACT inhaler Inhale 2 puffs into the lungs every 6 (six) hours as needed for wheezing or shortness of breath. 03/15/21   Dettinger, Fransisca Kaufmann, MD  atorvastatin (LIPITOR) 10 MG tablet Take 1 tablet by mouth once daily 07/08/21   Ronnie Doss M, DO  azelastine (ASTELIN) 0.1 % nasal spray Place 1 spray into both nostrils 2 (two) times daily. 03/22/21   [name], DO  ciclopirox (PENLAC) 8 % solution Apply topically at bedtime. Apply over nail and surrounding skin. Apply daily over previous coat. After seven (7) days, may remove with alcohol and continue cycle. 05/11/21   [name], DO  famotidine (PEPCID) 20 MG tablet Take 1 tablet (20 mg total) by mouth 2 (two) times daily as needed for heartburn or indigestion. 03/24/21   Dettinger, Fransisca Kaufmann, MD  gabapentin (NEURONTIN) 300 MG capsule Take 1-2 capsules (300-600 mg total) by mouth at bedtime. 03/02/20   [name], DO  ibuprofen (ADVIL) 400 MG tablet Take 400 mg by mouth every 6 (six) hours as needed for moderate pain.    [provider]  insulin glargine (SEMGLEE) 100 UNIT/ML Solostar Pen Inject 70 Units into the skin daily. 11/16/20 02/14/21  [name], DO  Insulin Pen Needle (RELION PEN NEEDLES) 32G X 4 MM MISC USE TO INJECT MEDICATION ONCE DAILY Dx 10.9 08/20/18   Terald Sleeper, PA-C  JARDIANCE  25 MG TABS tablet TAKE 1 TABLET BY MOUTH BEFORE BREAKFAST 05/09/21   Ronnie Doss M, DO  levocetirizine (XYZAL) 5 MG tablet Take 1 tablet (5 mg total) by mouth every evening. 03/22/21   [name], DO  lidocaine (LIDODERM) 5 % Place 1 patch onto the skin daily. Remove & Discard patch within 12 hours or as directed by MD 02/21/20   Henderly, Britni A, PA-C  magic mouthwash (nystatin, lidocaine, diphenhydrAMINE, alum & mag hydroxide) suspension Swish and swallow 5 mLs 3 (three) times daily. Equal parts nystatin, lidocaine, diphenhydramine and Maalox 03/24/21    Dettinger, Fransisca Kaufmann, MD  methocarbamol (ROBAXIN) 500 MG tablet Take 1 tablet (500 mg total) by mouth 2 (two) times daily as needed for muscle spasms. 03/02/20   [name], DO  omeprazole (PRILOSEC) 40 MG capsule Take 1 capsule (40 mg total) by mouth daily. 11/13/17   Terald Sleeper, PA-C  ondansetron (ZOFRAN ODT) 8 MG disintegrating tablet Take 1 tablet (8 mg total) by mouth every 8 (eight) hours as needed for nausea or vomiting. 08/07/18   Terald Sleeper, PA-C  PARoxetine (PAXIL) 30 MG tablet Take 1 tablet by mouth once daily 06/08/21   Ronnie Doss M, DO  SUMAtriptan (IMITREX) 100 MG tablet TAKE 1 TABLET BY MOUTH EVERY 2 HOURS AS NEEDED FOR MIGRAINE HEADACHE 01/21/21   Ronnie Doss M, DO  telmisartan-hydrochlorothiazide (MICARDIS HCT) 40-12.5 MG tablet Take 0.5 tablets by mouth daily. 11/24/20   [name], DO  tirzepatide [facility] 5 MG/0.5ML Pen Inject 5 mg into the skin once a week. Start in January 05/11/21   Ronnie Doss M, DO  tirzepatide [facility]) 7.5 MG/0.5ML Pen Inject 7.5 mg into the skin once a week. Start in February 05/11/21   [name], DO      Allergies    Patient has no known allergies.    Review of Systems   Review of Systems  Constitutional:  Positive for fatigue. Negative for chills and fever.  HENT: Negative.    Respiratory:  Positive for shortness of breath. Negative for cough.   Cardiovascular:  Negative for chest pain.  Gastrointestinal:  Positive for diarrhea and vomiting. Negative for abdominal pain.  Genitourinary:  Negative for dysuria and frequency.  Musculoskeletal:  Negative for arthralgias and myalgias.  Neurological:  Positive for syncope, weakness and headaches. Negative for speech difficulty and numbness.  All other systems reviewed and are negative.  Physical Exam Updated Vital Signs BP 117/65    Pulse 91    Temp 97.8 F (36.6 C)    Resp 18    Ht 5\' 4"  (1.626 m)    Wt 87.1 kg    SpO2 99%    BMI 32.96 kg/m   Physical Exam Vitals and nursing note reviewed.  Constitutional:      General: She is not in acute distress.    Appearance: Normal appearance. She is well-developed. She is ill-appearing. She is not diaphoretic.     Comments: Alert mildly confused, somewhat ill-appearing but not in acute distress  HENT:     Head: Normocephalic and atraumatic.     Comments: No palpable hematoma, step-off or deformity, negative battle sign Eyes:     General:        Right eye: No discharge.        Left eye: No discharge.     Extraocular Movements: Extraocular movements intact.     Pupils: Pupils are equal, round, and reactive to light.  Neck:  Comments: Some midline C-spine tenderness present without palpable step-off or deformity. Cardiovascular:     Rate and Rhythm: Normal rate and regular rhythm.     Pulses: Normal pulses.     Heart sounds: Normal heart sounds.  Pulmonary:     Effort: Pulmonary effort is normal. No respiratory distress.     Breath sounds: Normal breath sounds. No wheezing or rales.     Comments: Respirations equal and unlabored, patient able to speak in full sentences, lungs clear to auscultation bilaterally  Abdominal:     General: Bowel sounds are normal. There is no distension.     Palpations: Abdomen is soft. There is no mass.     Tenderness: There is no abdominal tenderness. There is no guarding.     Comments: Abdomen soft, nondistended, nontender to palpation in all quadrants without guarding or peritoneal signs  Musculoskeletal:        General: No deformity.     Cervical back: Neck supple. Tenderness present.     Comments: Mild diffuse cramping in the legs but no focal tenderness, arthritis swelling or deformity noted.  Inspection of all joints supple and easily movable, all compartments soft.  No focal midline thoracic or lumbar spine tenderness.  Skin:    General: Skin is warm and dry.     Capillary Refill: Capillary refill takes less than 2 seconds.  Neurological:      Mental Status: She is alert and oriented to person, place, and time.     Coordination: Coordination normal.     Comments: Speech is clear, able to follow commands, but responses seem to be somewhat slowed. CN III-XII intact Normal strength in upper and lower extremities bilaterally including dorsiflexion and plantar flexion, strong and equal grip strength Sensation normal to light and sharp touch Moves extremities without ataxia, coordination intact  Psychiatric:        Mood and Affect: Mood normal.        Behavior: Behavior normal.    ED Results / Procedures / Treatments   Labs (all labs ordered are listed, but only abnormal results are displayed) Labs Reviewed  CBC WITH DIFFERENTIAL/PLATELET - Abnormal; Notable for the following components:      Result Value   WBC 24.8 (*)    Neutro Abs 22.1 (*)    Monocytes Absolute 1.2 (*)    Abs Immature Granulocytes 0.25 (*)    All other components within normal limits  CBG MONITORING, ED - Abnormal; Notable for the following components:   Glucose-Capillary 122 (*)    All other components within normal limits  RESP PANEL BY RT-PCR (FLU A&B, COVID) ARPGX2  COMPREHENSIVE METABOLIC PANEL  LIPASE, BLOOD  LACTIC ACID, PLASMA  LACTIC ACID, PLASMA  CK  URINALYSIS, ROUTINE W REFLEX MICROSCOPIC  MAGNESIUM  ETHANOL  RAPID URINE DRUG SCREEN, HOSP PERFORMED  AMMONIA    EKG EKG Interpretation  Date/Time:  Friday July 29 2021 10:09:46 EST Ventricular Rate:  90 PR Interval:  185 QRS Duration: 92 QT Interval:  382 QTC Calculation: 468 R Axis:   -18 Text Interpretation: Sinus rhythm Borderline left axis deviation Anterior infarct, old No significant change was found Confirmed by Daleen Bo [id number]) on 07/29/2021 10:38:28 AM  Radiology CT Head Wo Contrast  Result Date: 07/29/2021 CLINICAL DATA:  Patient found on floor by husband. Nausea and vomiting beginning this morning. Possible neck trauma. Confusion. EXAM: CT HEAD WITHOUT  CONTRAST CT CERVICAL SPINE WITHOUT CONTRAST TECHNIQUE: Multidetector CT imaging of the head and cervical spine  was performed following the standard protocol without intravenous contrast. Multiplanar CT image reconstructions of the cervical spine were also generated. RADIATION DOSE REDUCTION: This exam was performed according to the departmental dose-optimization program which includes automated exposure control, adjustment of the mA and/or kV according to patient size and/or use of iterative reconstruction technique. COMPARISON:  None. FINDINGS: CT HEAD FINDINGS Brain: No evidence of acute infarction, hemorrhage, hydrocephalus, extra-axial collection or mass lesion/mass effect. Vascular: No hyperdense vessel or unexpected calcification. Skull: Normal. Negative for fracture or focal lesion. Sinuses/Orbits: No acute finding. Other: None. CT CERVICAL SPINE FINDINGS Alignment: Normal. Skull base and vertebrae: Vertebral body heights are maintained. There is mild spondylosis throughout the cervical spine. No acute fracture. Atlantoaxial articulation is unremarkable. Bilateral neural foraminal narrowing at the C6-7 level. Mild left-sided neural foraminal narrowing at the C3-4 level. Soft tissues and spinal canal: Prevertebral soft tissues are normal. Subtle canal stenosis at the C6-7 level. Disc levels: Minimal disc space narrowing at the C5-6 and C6-7 levels. Upper chest: No acute findings. Other: None. IMPRESSION: 1. No acute intracranial findings. 2. No acute cervical spine fracture or subluxation. 3. Mild spondylosis throughout the cervical spine with minimal disc disease at the C5-6 and C6-7 levels. Bilateral neural foraminal narrowing at the C6-7 level and mild left-sided neural foraminal narrowing at the C3-4 level. Subtle canal stenosis at the C6-7 level. Electronically Signed   By: Marin Olp M.D.   On: 07/29/2021 12:14   CT Cervical Spine Wo Contrast  Result Date: 07/29/2021 CLINICAL DATA:  Patient found  on floor by husband. Nausea and vomiting beginning this morning. Possible neck trauma. Confusion. EXAM: CT HEAD WITHOUT CONTRAST CT CERVICAL SPINE WITHOUT CONTRAST TECHNIQUE: Multidetector CT imaging of the head and cervical spine was performed following the standard protocol without intravenous contrast. Multiplanar CT image reconstructions of the cervical spine were also generated. RADIATION DOSE REDUCTION: This exam was performed according to the departmental dose-optimization program which includes automated exposure control, adjustment of the mA and/or kV according to patient size and/or use of iterative reconstruction technique. COMPARISON:  None. FINDINGS: CT HEAD FINDINGS Brain: No evidence of acute infarction, hemorrhage, hydrocephalus, extra-axial collection or mass lesion/mass effect. Vascular: No hyperdense vessel or unexpected calcification. Skull: Normal. Negative for fracture or focal lesion. Sinuses/Orbits: No acute finding. Other: None. CT CERVICAL SPINE FINDINGS Alignment: Normal. Skull base and vertebrae: Vertebral body heights are maintained. There is mild spondylosis throughout the cervical spine. No acute fracture. Atlantoaxial articulation is unremarkable. Bilateral neural foraminal narrowing at the C6-7 level. Mild left-sided neural foraminal narrowing at the C3-4 level. Soft tissues and spinal canal: Prevertebral soft tissues are normal. Subtle canal stenosis at the C6-7 level. Disc levels: Minimal disc space narrowing at the C5-6 and C6-7 levels. Upper chest: No acute findings. Other: None. IMPRESSION: 1. No acute intracranial findings. 2. No acute cervical spine fracture or subluxation. 3. Mild spondylosis throughout the cervical spine with minimal disc disease at the C5-6 and C6-7 levels. Bilateral neural foraminal narrowing at the C6-7 level and mild left-sided neural foraminal narrowing at the C3-4 level. Subtle canal stenosis at the C6-7 level. Electronically Signed   By: Marin Olp  M.D.   On: 07/29/2021 12:14   DG Chest Port 1 View  Result Date: 07/29/2021 CLINICAL DATA:  Fall, confusion EXAM: PORTABLE CHEST 1 VIEW COMPARISON:  None. FINDINGS: The heart size and mediastinal contours are within normal limits. Slightly increased interstitial markings within the left lung base. Lungs are otherwise clear. No pleural  effusion or pneumothorax. The visualized skeletal structures are unremarkable. IMPRESSION: Slightly increased interstitial markings within the left lung base, which could represent atelectasis versus developing infiltrate. Otherwise, no acute cardiopulmonary findings. Electronically Signed   By: Davina Poke D.O.   On: 07/29/2021 11:03    Procedures Procedures    Medications Ordered in ED Medications  sodium chloride 0.9 % bolus 1,000 mL (1,000 mLs Intravenous New Bag/Given 07/29/21 1119)  ondansetron (ZOFRAN) injection 4 mg (4 mg Intravenous Given 07/29/21 1119)  acetaminophen (TYLENOL) tablet 1,000 mg (1,000 mg Oral Given 07/29/21 1120)    ED Course/ Medical Decision Making/ A&P                          This patient presents to the ED for concern of fall, confusion, this involves an extensive number of treatment options, and is a complaint that carries with it a high risk of complications and morbidity.  The differential diagnosis includes syncope, orthostatic hypotension, seizure, arrhythmia, infection, hypovolemia, electrolyte derangement, DKA   Co morbidities that complicate the patient evaluation  Diabetes, hypertension, hyperlipidemia   Additional history obtained:  Additional history obtained from spouse at bedside and EMS personnel External records from outside source obtained and reviewed including recent PCP notes    Lab Tests:  I Ordered, and personally interpreted labs.  The pertinent results include: 6 skin monocytosis of 24.8, normal hemoglobin, surprisingly no significant electrolyte derangements and normal renal and liver function.   Lactic acid is not elevated and CK is very minimally elevated so I am less concerned for seizure, negative ethanol and UDS.  UA with trace leukocytes.  Ammonia WNL.   Imaging Studies ordered:  I ordered imaging studies including CXR, CT head and c-spine  I independently visualized and interpreted imaging which showed chest x-ray with an opacity in the left lower lobe concerning for possible pneumonia, CT of the head without intracranial bleed, CT cervical spine without obvious fracture or malalignment I agree with the radiologist interpretation   Cardiac Monitoring:  The patient was maintained on a cardiac monitor.  I personally viewed and interpreted the cardiac monitored which showed an underlying rhythm of: NSR   Medicines ordered and prescription drug management:  I ordered medication including 1L NS bolus, tylenol for muscle cramping, Rocephin and azithromycin for community-acquired pneumonia. Reevaluation of the patient after these medicines showed that the patient improved, but patient still having some weakness and near syncopal symptoms when ambulating, remains static I have reviewed the patients home medicines and have made adjustments as needed   Critical Interventions:  IV fluids & ABX   Consultations Obtained:  I requested consultation with the hospitalist, Dr. Nehemiah Settle,  and discussed lab and imaging findings as well as pertinent plan - they will see and admit the patient   Problem List / ED Course:  Patient with likely syncopal episode, orthostasis, after recent GI bug also noted to have a left lower lobe pneumonia on chest x-ray with leukocytosis of 24.8.  Patient given IV fluid bolus, but still with some mild orthostasis and symptomatic when up walking.  Maintained normal O2 sats.  Discussed work-up with patient but given that she still feeling somewhat poorly feel she would benefit from admission for continued hydration and monitoring.   Reevaluation:  After  the interventions noted above, I reevaluated the patient and found that they have :improved   Social Determinants of Health:  Former smoker   Dispostion:  After consideration of  the diagnostic results and the patients response to treatment, I feel that the patent would benefit from admission.          Final Clinical Impression(s) / ED Diagnoses Final diagnoses:  Orthostatic hypotension  Syncope, unspecified syncope type  Pneumonia of left lower lobe due to infectious organism    Rx / DC Orders ED Discharge Orders     None         Janet Berlin 07/30/21 1049    Daleen Bo, MD 08/01/21 1319

## 2021-07-29 NOTE — ED Notes (Signed)
Pt walked in room ,O2 stayed 96-99. Pt stated her legs was just really sore.

## 2021-07-29 NOTE — ED Triage Notes (Signed)
Pt bib ems from home for n/v that started this morning.  Reports that she was confused this morning.  Husband found pt on floor.  Orthostatic by ems.  20 ga iv to Lac.  562ml ns bolus given.  4 mg of zofran given.  Pt is diabetic.  Unknown LOC.  Reports pain to neck legs and head.  Resp even and unlabored.  Negative stroke screen.

## 2021-07-29 NOTE — H&P (Addendum)
History and Physical    Patient: Carrie [name] IRS:[id number] DOB: 07-09-1960 DOA: 07/29/2021 DOS: the patient was seen and examined on 07/29/2021 PCP: [name], DO  Patient coming from: Home  Chief Complaint:  Chief Complaint  Patient presents with   Vomiting    HPI: Carrie [name] is a 61 y.o. female with medical history significant of type 2 diabetes, hypertension, gastroparesis, hyperlipidemia.  Patient brought to the hospital for evaluation secondary to possible unwitnessed syncopal episode.  Patient has been dealing with a GI infection over the past several days with nausea, vomiting, diarrhea.  [name]  Her husband was keeping track of things via home security cameras.  Around 9 AM, the patient called her partner.  [name]  He rushed home and found her lying on the ground.  EMS was called who found the patient to be orthostatic and hypotensive.  They started IV fluids and transported to the hospital for evaluation.  Here, the patient received more IV fluids and is continued to be orthostatic with hypotension.  She did vomit on the way to the hospital.  Since then, she has not vomited.  She denies chest pain, fevers, chills.  [name]  Review of Systems: As mentioned in the history of present illness. All other systems reviewed and are negative. Past Medical History:  Diagnosis Date   Depression    Diabetes mellitus without complication ([place])    Fever blister    Gastroparesis    Hyperlipidemia    Hypertension    Migraine    Past Surgical History:  Procedure Laterality Date   BACK SURGERY     CHOLECYSTECTOMY  2004   Social History:  reports that she quit smoking about 17 years ago. Her smoking use included cigarettes. She has a 20.00 pack-year smoking history. She has never used smokeless tobacco. She reports that she does not currently use alcohol. She  reports that she does not use drugs.  No Known Allergies  Family History  Problem Relation Age of Onset   Ovarian cancer Mother    Mental illness Daughter    Migraines Daughter    Colon cancer Neg Hx    Rectal cancer Neg Hx    Throat cancer Neg Hx     Prior to Admission medications   Medication Sig Start Date End Date Taking? Authorizing Provider  albuterol (VENTOLIN HFA) 108 (90 Base) MCG/ACT inhaler Inhale 2 puffs into the lungs every 6 (six) hours as needed for wheezing or shortness of breath. 03/15/21  Yes Dettinger, Fransisca Kaufmann, MD  aspirin EC 81 MG tablet Take 81 mg by mouth daily. Swallow whole.   Yes [provider]  atorvastatin (LIPITOR) 10 MG tablet Take 1 tablet by mouth once daily 07/08/21  Yes Gottschalk, Ashly M, DO  cholecalciferol (VITAMIN D3) 25 MCG (1000 UNIT) tablet Take 1,000 Units by mouth daily.   Yes [provider]  insulin glargine (SEMGLEE) 100 UNIT/ML Solostar Pen Inject 70 Units into the skin daily. 11/16/20 07/29/21 Yes Gottschalk, Ashly M, DO  JARDIANCE 25 MG TABS tablet TAKE 1 TABLET BY MOUTH BEFORE BREAKFAST 05/09/21  Yes Gottschalk, Ashly M, DO  ondansetron (ZOFRAN ODT) 8 MG disintegrating tablet Take 1 tablet (8 mg total) by mouth every 8 (eight) hours as needed for nausea or vomiting. 08/07/18  Yes Terald Sleeper, PA-C  PARoxetine (PAXIL) 30 MG  tablet Take 1 tablet by mouth once daily 06/08/21  Yes Gottschalk, Ashly M, DO  SUMAtriptan (IMITREX) 100 MG tablet TAKE 1 TABLET BY MOUTH EVERY 2 HOURS AS NEEDED FOR MIGRAINE HEADACHE 01/21/21  Yes Gottschalk, Ashly M, DO  telmisartan-hydrochlorothiazide (MICARDIS HCT) 40-12.5 MG tablet Take 0.5 tablets by mouth daily. 11/24/20  Yes Gottschalk, Ashly M, DO  tirzepatide [facility]) 7.5 MG/0.5ML Pen Inject 7.5 mg into the skin once a week. Start in February 05/11/21  Yes Gottschalk, Leatrice Jewels M, DO  valACYclovir (VALTREX) 1000 MG tablet Take 1,000 mg by mouth 2 (two) times daily. 07/06/21  Yes [provider]   azelastine (ASTELIN) 0.1 % nasal spray Place 1 spray into both nostrils 2 (two) times daily. Patient not taking: Reported on 07/29/2021 03/22/21   [name], DO  ciclopirox [facility]) 8 % solution Apply topically at bedtime. Apply over nail and surrounding skin. Apply daily over previous coat. After seven (7) days, may remove with alcohol and continue cycle. Patient not taking: Reported on 07/29/2021 05/11/21   [name], DO  famotidine (PEPCID) 20 MG tablet Take 1 tablet (20 mg total) by mouth 2 (two) times daily as needed for heartburn or indigestion. Patient not taking: Reported on 07/29/2021 03/24/21   Dettinger, Fransisca Kaufmann, MD  gabapentin (NEURONTIN) 300 MG capsule Take 1-2 capsules (300-600 mg total) by mouth at bedtime. Patient not taking: Reported on 07/29/2021 03/02/20   [name], DO  Insulin Pen Needle (RELION PEN NEEDLES) 32G X 4 MM MISC USE TO INJECT MEDICATION ONCE DAILY Dx 10.9 08/20/18   Terald Sleeper, PA-C  levocetirizine (XYZAL) 5 MG tablet Take 1 tablet (5 mg total) by mouth every evening. Patient not taking: Reported on 07/29/2021 03/22/21   [name], DO  lidocaine (LIDODERM) 5 % Place 1 patch onto the skin daily. Remove & Discard patch within 12 hours or as directed by MD Patient not taking: Reported on 07/29/2021 02/21/20   Henderly, Britni A, PA-C  magic mouthwash (nystatin, lidocaine, diphenhydrAMINE, alum & mag hydroxide) suspension Swish and swallow 5 mLs 3 (three) times daily. Equal parts nystatin, lidocaine, diphenhydramine and Maalox Patient not taking: Reported on 07/29/2021 03/24/21   Dettinger, Fransisca Kaufmann, MD  methocarbamol (ROBAXIN) 500 MG tablet Take 1 tablet (500 mg total) by mouth 2 (two) times daily as needed for muscle spasms. Patient not taking: Reported on 07/29/2021 03/02/20   [name], DO  omeprazole (PRILOSEC) 40 MG capsule Take 1 capsule (40 mg total) by mouth daily. Patient not taking: Reported on 07/29/2021 11/13/17    Terald Sleeper, PA-C  tirzepatide [facility]) 5 MG/0.5ML Pen Inject 5 mg into the skin once a week. Start in January Patient not taking: Reported on 07/29/2021 05/11/21   [name], DO    Physical Exam: Vitals:   07/29/21 1230 07/29/21 1300 07/29/21 1400 07/29/21 1630  BP: 117/82 112/67 104/63 108/65  Pulse: 88 91 95 93  Resp: 17 14 20 12   Temp:      SpO2: 98% 93% 97% 94%  Weight:      Height:       General: Slightly older female. Awake and alert and oriented x3. No acute cardiopulmonary distress.  HEENT: Normocephalic atraumatic.  Right and left ears normal in appearance.  Pupils equal, round, reactive to light. Extraocular muscles are intact. Sclerae anicteric and noninjected.  Moist mucosal membranes. No mucosal lesions.  Neck: Neck supple without lymphadenopathy. No carotid bruits. No masses palpated.  Cardiovascular:  Regular rate with normal S1-S2 sounds. No murmurs, rubs, gallops auscultated. No JVD.  Respiratory: Mild Rales in the left base. No egophony.  No accessory muscle use. Abdomen: Soft, mild diffuse tenderness, nondistended. Active bowel sounds. No masses or hepatosplenomegaly  Skin: No rashes, lesions, or ulcerations.  Dry, warm to touch. 2+ dorsalis pedis and radial pulses. Musculoskeletal: No calf or leg pain. All major joints not erythematous nontender.  No upper or lower joint deformation.  Good ROM.  No contractures  Psychiatric: Intact judgment and insight. Pleasant and cooperative. Neurologic: No focal neurological deficits. Strength is 5/5 and symmetric in upper and lower extremities.  Cranial nerves II through XII are grossly intact.   Data Reviewed: Results for orders placed or performed during the hospital encounter of 07/29/21 (from the past 24 hour(s))  CBG monitoring, ED     Status: Abnormal   Collection Time: 07/29/21 10:22 AM  Result Value Ref Range   Glucose-Capillary 122 (H) 70 - 99 mg/dL  Resp Panel by RT-PCR (Flu A&B, Covid) Nasopharyngeal  Swab     Status: None   Collection Time: 07/29/21 10:38 AM   Specimen: Nasopharyngeal Swab; Nasopharyngeal(NP) swabs in vial transport medium  Result Value Ref Range   SARS Coronavirus 2 by RT PCR NEGATIVE NEGATIVE   Influenza A by PCR NEGATIVE NEGATIVE   Influenza B by PCR NEGATIVE NEGATIVE  Urinalysis, Routine w reflex microscopic Urine, Clean Catch     Status: Abnormal   Collection Time: 07/29/21 10:38 AM  Result Value Ref Range   Color, Urine YELLOW YELLOW   APPearance CLEAR CLEAR   Specific Gravity, Urine 1.025 1.005 - 1.030   pH 5.5 5.0 - 8.0   Glucose, UA >=500 (A) NEGATIVE mg/dL   Hgb urine dipstick TRACE (A) NEGATIVE   Bilirubin Urine NEGATIVE NEGATIVE   Ketones, ur 15 (A) NEGATIVE mg/dL   Protein, ur NEGATIVE NEGATIVE mg/dL   Nitrite NEGATIVE NEGATIVE   Leukocytes,Ua TRACE (A) NEGATIVE  Urinalysis, Microscopic (reflex)     Status: Abnormal   Collection Time: 07/29/21 10:38 AM  Result Value Ref Range   RBC / HPF 0-5 0 - 5 RBC/hpf   WBC, UA 0-5 0 - 5 WBC/hpf   Bacteria, UA NONE SEEN NONE SEEN   Squamous Epithelial / LPF 0-5 0 - 5   Non Squamous Epithelial PRESENT (A) NONE SEEN   Uric Acid Crys, UA PRESENT   Urine rapid drug screen (hosp performed)     Status: None   Collection Time: 07/29/21 10:45 AM  Result Value Ref Range   Opiates NONE DETECTED NONE DETECTED   Cocaine NONE DETECTED NONE DETECTED   Benzodiazepines NONE DETECTED NONE DETECTED   Amphetamines NONE DETECTED NONE DETECTED   Tetrahydrocannabinol NONE DETECTED NONE DETECTED   Barbiturates NONE DETECTED NONE DETECTED  Comprehensive metabolic panel     Status: Abnormal   Collection Time: 07/29/21 11:11 AM  Result Value Ref Range   Sodium 140 135 - 145 mmol/L   Potassium 3.5 3.5 - 5.1 mmol/L   Chloride 106 98 - 111 mmol/L   CO2 25 22 - 32 mmol/L   Glucose, Bld 110 (H) 70 - 99 mg/dL   BUN 15 8 - 23 mg/dL   Creatinine, Ser 0.70 0.44 - 1.00 mg/dL   Calcium 8.7 (L) 8.9 - 10.3 mg/dL   Total Protein 7.3  6.5 - 8.1 g/dL   Albumin 4.1 3.5 - 5.0 g/dL   AST 26 15 - 41 U/L   ALT 24  0 - 44 U/L   Alkaline Phosphatase 69 38 - 126 U/L   Total Bilirubin 1.2 0.3 - 1.2 mg/dL   GFR, Estimated >60 >60 mL/min   Anion gap 9 5 - 15  CBC with Differential     Status: Abnormal   Collection Time: 07/29/21 11:11 AM  Result Value Ref Range   WBC 24.8 (H) 4.0 - 10.5 K/uL   RBC 4.50 3.87 - 5.11 MIL/uL   Hemoglobin 14.0 12.0 - 15.0 g/dL   HCT 42.6 36.0 - 46.0 %   MCV 94.7 80.0 - 100.0 fL   MCH 31.1 26.0 - 34.0 pg   MCHC 32.9 30.0 - 36.0 g/dL   RDW 13.0 11.5 - 15.5 %   Platelets 395 150 - 400 K/uL   nRBC 0.0 0.0 - 0.2 %   Neutrophils Relative % 89 %   Neutro Abs 22.1 (H) 1.7 - 7.7 K/uL   Lymphocytes Relative 5 %   Lymphs Abs 1.1 0.7 - 4.0 K/uL   Monocytes Relative 5 %   Monocytes Absolute 1.2 (H) 0.1 - 1.0 K/uL   Eosinophils Relative 0 %   Eosinophils Absolute 0.0 0.0 - 0.5 K/uL   Basophils Relative 0 %   Basophils Absolute 0.1 0.0 - 0.1 K/uL   Immature Granulocytes 1 %   Abs Immature Granulocytes 0.25 (H) 0.00 - 0.07 K/uL  Lipase, blood     Status: None   Collection Time: 07/29/21 11:11 AM  Result Value Ref Range   Lipase 38 11 - 51 U/L  Lactic acid, plasma     Status: None   Collection Time: 07/29/21 11:11 AM  Result Value Ref Range   Lactic Acid, Venous 1.7 0.5 - 1.9 mmol/L  CK     Status: Abnormal   Collection Time: 07/29/21 11:11 AM  Result Value Ref Range   Total CK 255 (H) 38 - 234 U/L  Magnesium     Status: None   Collection Time: 07/29/21 11:11 AM  Result Value Ref Range   Magnesium 2.3 1.7 - 2.4 mg/dL  Ethanol     Status: None   Collection Time: 07/29/21 11:11 AM  Result Value Ref Range   Alcohol, Ethyl (B) <10 <10 mg/dL  Ammonia     Status: None   Collection Time: 07/29/21 11:11 AM  Result Value Ref Range   Ammonia <10 9 - 35 umol/L  Procalcitonin - Baseline     Status: None   Collection Time: 07/29/21 11:11 AM  Result Value Ref Range   Procalcitonin <0.10 ng/mL   CT  Head Wo Contrast  Result Date: 07/29/2021 CLINICAL DATA:  Patient found on floor by husband. Nausea and vomiting beginning this morning. Possible neck trauma. Confusion. EXAM: CT HEAD WITHOUT CONTRAST CT CERVICAL SPINE WITHOUT CONTRAST TECHNIQUE: Multidetector CT imaging of the head and cervical spine was performed following the standard protocol without intravenous contrast. Multiplanar CT image reconstructions of the cervical spine were also generated. RADIATION DOSE REDUCTION: This exam was performed according to the departmental dose-optimization program which includes automated exposure control, adjustment of the mA and/or kV according to patient size and/or use of iterative reconstruction technique. COMPARISON:  None. FINDINGS: CT HEAD FINDINGS Brain: No evidence of acute infarction, hemorrhage, hydrocephalus, extra-axial collection or mass lesion/mass effect. Vascular: No hyperdense vessel or unexpected calcification. Skull: Normal. Negative for fracture or focal lesion. Sinuses/Orbits: No acute finding. Other: None. CT CERVICAL SPINE FINDINGS Alignment: Normal. Skull base and vertebrae: Vertebral body heights are maintained. There is  mild spondylosis throughout the cervical spine. No acute fracture. Atlantoaxial articulation is unremarkable. Bilateral neural foraminal narrowing at the C6-7 level. Mild left-sided neural foraminal narrowing at the C3-4 level. Soft tissues and spinal canal: Prevertebral soft tissues are normal. Subtle canal stenosis at the C6-7 level. Disc levels: Minimal disc space narrowing at the C5-6 and C6-7 levels. Upper chest: No acute findings. Other: None. IMPRESSION: 1. No acute intracranial findings. 2. No acute cervical spine fracture or subluxation. 3. Mild spondylosis throughout the cervical spine with minimal disc disease at the C5-6 and C6-7 levels. Bilateral neural foraminal narrowing at the C6-7 level and mild left-sided neural foraminal narrowing at the C3-4 level. Subtle  canal stenosis at the C6-7 level. Electronically Signed   By: Marin Olp M.D.   On: 07/29/2021 12:14   CT Cervical Spine Wo Contrast  Result Date: 07/29/2021 CLINICAL DATA:  Patient found on floor by husband. Nausea and vomiting beginning this morning. Possible neck trauma. Confusion. EXAM: CT HEAD WITHOUT CONTRAST CT CERVICAL SPINE WITHOUT CONTRAST TECHNIQUE: Multidetector CT imaging of the head and cervical spine was performed following the standard protocol without intravenous contrast. Multiplanar CT image reconstructions of the cervical spine were also generated. RADIATION DOSE REDUCTION: This exam was performed according to the departmental dose-optimization program which includes automated exposure control, adjustment of the mA and/or kV according to patient size and/or use of iterative reconstruction technique. COMPARISON:  None. FINDINGS: CT HEAD FINDINGS Brain: No evidence of acute infarction, hemorrhage, hydrocephalus, extra-axial collection or mass lesion/mass effect. Vascular: No hyperdense vessel or unexpected calcification. Skull: Normal. Negative for fracture or focal lesion. Sinuses/Orbits: No acute finding. Other: None. CT CERVICAL SPINE FINDINGS Alignment: Normal. Skull base and vertebrae: Vertebral body heights are maintained. There is mild spondylosis throughout the cervical spine. No acute fracture. Atlantoaxial articulation is unremarkable. Bilateral neural foraminal narrowing at the C6-7 level. Mild left-sided neural foraminal narrowing at the C3-4 level. Soft tissues and spinal canal: Prevertebral soft tissues are normal. Subtle canal stenosis at the C6-7 level. Disc levels: Minimal disc space narrowing at the C5-6 and C6-7 levels. Upper chest: No acute findings. Other: None. IMPRESSION: 1. No acute intracranial findings. 2. No acute cervical spine fracture or subluxation. 3. Mild spondylosis throughout the cervical spine with minimal disc disease at the C5-6 and C6-7 levels.  Bilateral neural foraminal narrowing at the C6-7 level and mild left-sided neural foraminal narrowing at the C3-4 level. Subtle canal stenosis at the C6-7 level. Electronically Signed   By: Marin Olp M.D.   On: 07/29/2021 12:14   DG Chest Port 1 View  Result Date: 07/29/2021 CLINICAL DATA:  Fall, confusion EXAM: PORTABLE CHEST 1 VIEW COMPARISON:  None. FINDINGS: The heart size and mediastinal contours are within normal limits. Slightly increased interstitial markings within the left lung base. Lungs are otherwise clear. No pleural effusion or pneumothorax. The visualized skeletal structures are unremarkable. IMPRESSION: Slightly increased interstitial markings within the left lung base, which could represent atelectasis versus developing infiltrate. Otherwise, no acute cardiopulmonary findings. Electronically Signed   By: Davina Poke D.O.   On: 07/29/2021 11:03    EKG personally reviewed which shows sinus rhythm with borderline left axis deviation.  No acute ST elevation.  Assessment and Plan: No notes have been filed under this hospital service. Service: Hospitalist  Principal Problem:   Syncope Active Problems:   Type 2 diabetes mellitus without complication, without long-term current use of insulin (HCC)   Hypertension associated with diabetes ([place])   Morbid  obesity ([place])   Hyperlipidemia associated with type 2 diabetes mellitus ([place])   CAP (community acquired pneumonia)   Gastroenteritis  Syncope Observation on telemetry Get echo in the morning CBC, CMP in the morning CK slightly elevated, but not to the level of rhabdomyolysis.  We will give fluid hydration and reevaluate tomorrow morning.  Community-acquired pneumonia Check procalcitonin Lactic acid normal Patient received antibiotics Check urine antigens Recheck CBC in the morning Gastroenteritis Antiemetics prn Type 2 diabetes Continue home regimen minus the GLP-1's Sliding scale insulin with CBGs before meals  and nightly Hypertension Hold antihypertensives as orthostatic Obesity    Advance Care Planning:   Code Status: Full Code   Consults: none  Family Communication: none  Severity of Illness: The appropriate patient status for this patient is OBSERVATION. Observation status is judged to be reasonable and necessary in order to provide the required intensity of service to ensure the patient's safety. The patient's presenting symptoms, physical exam findings, and initial radiographic and laboratory data in the context of their medical condition is felt to place them at decreased risk for further clinical deterioration. Furthermore, it is anticipated that the patient will be medically stable for discharge from the hospital within 2 midnights of admission.   Author: Truett Mainland, DO 07/29/2021 4:56 PM  For on call review [email or web address].

## 2021-07-30 ENCOUNTER — Observation Stay (HOSPITAL_COMMUNITY): Payer: BC Managed Care – PPO

## 2021-07-30 DIAGNOSIS — Z87891 Personal history of nicotine dependence: Secondary | ICD-10-CM | POA: Diagnosis not present

## 2021-07-30 DIAGNOSIS — E118 Type 2 diabetes mellitus with unspecified complications: Secondary | ICD-10-CM

## 2021-07-30 DIAGNOSIS — E1143 Type 2 diabetes mellitus with diabetic autonomic (poly)neuropathy: Secondary | ICD-10-CM | POA: Diagnosis present

## 2021-07-30 DIAGNOSIS — D72823 Leukemoid reaction: Secondary | ICD-10-CM

## 2021-07-30 DIAGNOSIS — Z7982 Long term (current) use of aspirin: Secondary | ICD-10-CM | POA: Diagnosis not present

## 2021-07-30 DIAGNOSIS — K529 Noninfective gastroenteritis and colitis, unspecified: Secondary | ICD-10-CM | POA: Diagnosis not present

## 2021-07-30 DIAGNOSIS — E876 Hypokalemia: Secondary | ICD-10-CM

## 2021-07-30 DIAGNOSIS — Z79899 Other long term (current) drug therapy: Secondary | ICD-10-CM | POA: Diagnosis not present

## 2021-07-30 DIAGNOSIS — E785 Hyperlipidemia, unspecified: Secondary | ICD-10-CM | POA: Diagnosis present

## 2021-07-30 DIAGNOSIS — I951 Orthostatic hypotension: Secondary | ICD-10-CM | POA: Diagnosis present

## 2021-07-30 DIAGNOSIS — Z6835 Body mass index (BMI) 35.0-35.9, adult: Secondary | ICD-10-CM | POA: Diagnosis not present

## 2021-07-30 DIAGNOSIS — R159 Full incontinence of feces: Secondary | ICD-10-CM | POA: Diagnosis present

## 2021-07-30 DIAGNOSIS — M6282 Rhabdomyolysis: Secondary | ICD-10-CM | POA: Diagnosis present

## 2021-07-30 DIAGNOSIS — F32A Depression, unspecified: Secondary | ICD-10-CM | POA: Diagnosis present

## 2021-07-30 DIAGNOSIS — G43909 Migraine, unspecified, not intractable, without status migrainosus: Secondary | ICD-10-CM | POA: Diagnosis present

## 2021-07-30 DIAGNOSIS — Z20822 Contact with and (suspected) exposure to covid-19: Secondary | ICD-10-CM | POA: Diagnosis present

## 2021-07-30 DIAGNOSIS — R1115 Cyclical vomiting syndrome unrelated to migraine: Secondary | ICD-10-CM | POA: Diagnosis present

## 2021-07-30 DIAGNOSIS — E869 Volume depletion, unspecified: Secondary | ICD-10-CM | POA: Diagnosis present

## 2021-07-30 DIAGNOSIS — S01551A Open bite of lip, initial encounter: Secondary | ICD-10-CM | POA: Diagnosis present

## 2021-07-30 DIAGNOSIS — R55 Syncope and collapse: Secondary | ICD-10-CM | POA: Diagnosis not present

## 2021-07-30 DIAGNOSIS — Z8041 Family history of malignant neoplasm of ovary: Secondary | ICD-10-CM | POA: Diagnosis not present

## 2021-07-30 DIAGNOSIS — R32 Unspecified urinary incontinence: Secondary | ICD-10-CM | POA: Diagnosis present

## 2021-07-30 DIAGNOSIS — J189 Pneumonia, unspecified organism: Secondary | ICD-10-CM | POA: Diagnosis present

## 2021-07-30 DIAGNOSIS — E1169 Type 2 diabetes mellitus with other specified complication: Secondary | ICD-10-CM

## 2021-07-30 DIAGNOSIS — W19XXXA Unspecified fall, initial encounter: Secondary | ICD-10-CM | POA: Diagnosis present

## 2021-07-30 DIAGNOSIS — Z9049 Acquired absence of other specified parts of digestive tract: Secondary | ICD-10-CM | POA: Diagnosis not present

## 2021-07-30 DIAGNOSIS — R1110 Vomiting, unspecified: Secondary | ICD-10-CM | POA: Diagnosis present

## 2021-07-30 DIAGNOSIS — I152 Hypertension secondary to endocrine disorders: Secondary | ICD-10-CM | POA: Diagnosis present

## 2021-07-30 LAB — CBC
HCT: 37.3 % (ref 36.0–46.0)
Hemoglobin: 11.8 g/dL — ABNORMAL LOW (ref 12.0–15.0)
MCH: 30.6 pg (ref 26.0–34.0)
MCHC: 31.6 g/dL (ref 30.0–36.0)
MCV: 96.6 fL (ref 80.0–100.0)
Platelets: 333 10*3/uL (ref 150–400)
RBC: 3.86 MIL/uL — ABNORMAL LOW (ref 3.87–5.11)
RDW: 13.2 % (ref 11.5–15.5)
WBC: 11.4 10*3/uL — ABNORMAL HIGH (ref 4.0–10.5)
nRBC: 0 % (ref 0.0–0.2)

## 2021-07-30 LAB — ECHOCARDIOGRAM COMPLETE
AR max vel: 2.34 cm2
AV Area VTI: 2.35 cm2
AV Area mean vel: 2.35 cm2
AV Mean grad: 4 mmHg
AV Peak grad: 6.8 mmHg
Ao pk vel: 1.3 m/s
Area-P 1/2: 4.21 cm2
Height: 64 in
Weight: 3301.61 oz

## 2021-07-30 LAB — VITAMIN B12: Vitamin B-12: 229 pg/mL (ref 180–914)

## 2021-07-30 LAB — BASIC METABOLIC PANEL
Anion gap: 5 (ref 5–15)
BUN: 11 mg/dL (ref 8–23)
CO2: 26 mmol/L (ref 22–32)
Calcium: 8 mg/dL — ABNORMAL LOW (ref 8.9–10.3)
Chloride: 111 mmol/L (ref 98–111)
Creatinine, Ser: 0.75 mg/dL (ref 0.44–1.00)
GFR, Estimated: >60 mL/min (ref >60)
Glucose, Bld: 58 mg/dL — ABNORMAL LOW (ref 70–99)
Potassium: 3.1 mmol/L — ABNORMAL LOW (ref 3.5–5.1)
Sodium: 142 mmol/L (ref 135–145)

## 2021-07-30 LAB — TSH: TSH: 0.788 u[IU]/mL (ref 0.350–4.500)

## 2021-07-30 LAB — T4, FREE: Free T4: 0.67 ng/dL (ref 0.61–1.12)

## 2021-07-30 LAB — GLUCOSE, CAPILLARY
Glucose-Capillary: 137 mg/dL — ABNORMAL HIGH (ref 70–99)
Glucose-Capillary: 97 mg/dL (ref 70–99)
Glucose-Capillary: 108 mg/dL — ABNORMAL HIGH (ref 70–99)
Glucose-Capillary: 128 mg/dL — ABNORMAL HIGH (ref 70–99)

## 2021-07-30 LAB — FOLATE: Folate: 15 ng/mL (ref >5.9)

## 2021-07-30 LAB — CK: Total CK: 850 U/L — ABNORMAL HIGH (ref 38–234)

## 2021-07-30 MED ORDER — INSULIN GLARGINE-YFGN 100 UNIT/ML ~~LOC~~ SOLN
20.0000 [IU] | Freq: Every day | SUBCUTANEOUS | Status: DC
  Administered 2021-07-30 – 2021-08-01 (×3): 20 [IU] via SUBCUTANEOUS
  Filled 2021-07-30 (×4): qty 0.2

## 2021-07-30 MED ORDER — INSULIN GLARGINE 100 UNIT/ML ~~LOC~~ SOLN
20.0000 [IU] | Freq: Every day | SUBCUTANEOUS | Status: DC
  Filled 2021-07-30 (×2): qty 0.2

## 2021-07-30 MED ORDER — POTASSIUM CHLORIDE IN NACL 20-0.9 MEQ/L-% IV SOLN: INTRAVENOUS | Status: DC

## 2021-07-30 MED ORDER — POTASSIUM CHLORIDE CRYS ER 20 MEQ PO TBCR
20.0000 meq | EXTENDED_RELEASE_TABLET | Freq: Once | ORAL | Status: AC
  Administered 2021-07-30: 20 meq via ORAL
  Filled 2021-07-30: qty 1

## 2021-07-30 NOTE — Assessment & Plan Note (Addendum)
Patient did have nausea, vomiting, and diarrhea on 07/27/2021 Appears to have resolved Start diet and monitor clinically>>>tolerating Continue PPI

## 2021-07-30 NOTE — Assessment & Plan Note (Addendum)
Concerned about seizure Certainly, volume depletion and orthostasis contributed to her syncope Patient may also have a component of diabetic dysautonomia Case discussed with neurology--okay for close observation and outpatient work-up if CT brain unremarkable; hold off on AEDs Echo EF 60-65%, no WMA, no major valvular abnormaliites Continued IV fluids>>synmptomatically improved -UDS neg -no seizures during hospitalization -2/27 MRI brain--Minor burden of nonspecific gliosis/demyelination in the cerebral white matter I discussed with patient that she cannot drive until she follow up with neurology and is cleared EEG results pending at time of d/c Outpatient neurology follow up--referral made--pt has appt on 08/04/21

## 2021-07-30 NOTE — Progress Notes (Signed)
°  Echocardiogram 2D Echocardiogram has been performed.  Carrie [name] F 07/30/2021, 9:49 AM

## 2021-07-30 NOTE — Assessment & Plan Note (Signed)
BMI 35.42 Lifestyle modification

## 2021-07-30 NOTE — Assessment & Plan Note (Signed)
Continue statin. 

## 2021-07-30 NOTE — Assessment & Plan Note (Addendum)
Presented with WBC 24.8>>10.3 Likely stress demargination UA negative for pyuria PCT < 0.10 Lactic acid 1.7 Monitor off antibiotic--pt remained stable

## 2021-07-30 NOTE — Hospital Course (Addendum)
61 year old female with a history of diabetes mellitus, depression, gastroparesis, hypertension, hyperlipidemia presenting after a syncopal episode.  The patient was getting ready for work in the bathroom on 07/29/2021 when she lost consciousness and fell to the ground.  Notably, the patient has had nausea, vomiting, and diarrhea on 07/27/2021.  She stated that her entire family had some type of GI illness.  She did not have any fevers, chills but did have some abdominal cramping.  She states that she has gradually improved and has not had any further diarrhea since 07/27/2021 although she did have 1 episode of emesis in the ambulance to the hospital. Regarding her syncopal episode, the patient stated that she has been feeling dizzy for the past 2 days.  She remembers waking up and calling her husband on her cell phone.  She stated that she felt confused at that time and did not remember much of what happened even after she woke up.  Apparently, the patient was somewhat confused when her husband found her.  EMS was activated.  Apparently the patient was orthostatic and given a 500 cc bolus via EMS.  The patient stated that she bit her lip and did have bowel and bladder incontinence.  She states that she has not had any syncope or seizure in the past.  She denies any new medications in the past month.  She does not use any illicit drugs and drinks alcohol socially.  She quit smoking 18 years ago after 30-pack-year history.  She denies any chest pain, shortness of breath, hemoptysis, dysuria, hematuria.  CT brain neg.  She states that her nausea, vomiting, and diarrhea have improved.  In the ED, WBC 24.8, hemoglobin 14.0, platelets 395.  BMP showed sodium 140, potassium 4.5, bicarbonate 25, serum creatinine 0.70.  CPK 255>>850.  Pt started on IV fluid.

## 2021-07-30 NOTE — Assessment & Plan Note (Addendum)
Holding ARB/HCTZ combo due to soft blood pressure Restart after d/c as BP climbing back up

## 2021-07-30 NOTE — Assessment & Plan Note (Addendum)
Replete IV and po Add KCl to IVF Check mag--1.8

## 2021-07-30 NOTE — Assessment & Plan Note (Addendum)
07/29/2021 hemoglobin A1c 6.9 Start reduced dose Lantus NovoLog sliding scale CBGs controlled

## 2021-07-30 NOTE — Progress Notes (Addendum)
PROGRESS NOTE  Carrie [name] OVZ:[id number] DOB: 01-30-1961 DOA: 07/29/2021 PCP: [name], DO  Brief History:  61 year old female with a history of diabetes mellitus, depression, gastroparesis, hypertension, hyperlipidemia presenting after a syncopal episode.  The patient was getting ready for work in the bathroom on 07/29/2021 when she lost consciousness and fell to the ground.  Notably, the patient has had nausea, vomiting, and diarrhea on 07/27/2021.  She stated that her entire family had some type of GI illness.  She did not have any fevers, chills but did have some abdominal cramping.  She states that she has gradually improved and has not had any further diarrhea since 07/27/2021 although she did have 1 episode of emesis in the ambulance to the hospital. Regarding her syncopal episode, the patient stated that she has been feeling dizzy for the past 2 days.  She remembers waking up and calling her husband on her cell phone.  She stated that she felt confused at that time and did not remember much of what happened even after she woke up.  Apparently, the patient was somewhat confused when her husband found her.  EMS was activated.  Apparently the patient was orthostatic and given a 500 cc bolus via EMS.  The patient stated that she bit her lip and did have bowel and bladder incontinence.  She states that she has not had any syncope or seizure in the past.  She denies any new medications in the past month.  She does not use any illicit drugs and drinks alcohol socially.  She quit smoking 18 years ago after 30-pack-year history.  She denies any chest pain, shortness of breath, hemoptysis, dysuria, hematuria.  CT brain neg.  She states that her nausea, vomiting, and diarrhea have improved.  In the ED, WBC 24.8, hemoglobin 14.0, platelets 395.  BMP showed sodium 140, potassium 4.5, bicarbonate 25, serum creatinine 0.70.  CPK 255>>850.  Pt started on IV fluid.       Assessment and Plan: *  Syncope- (present on admission) Concerned about seizure Certainly, volume depletion and orthostasis contributed to her syncope Patient may also have a component of diabetic dysautonomia Case discussed with neurology--okay for close observation and outpatient work-up if CT brain unremarkable; hold off on AEDs Monitor clinically for another 24 hours Repeat orthostatics Echocardiogram  Continue IV fluids -UDS neg  Rhabdomyolysis Traumatic from fall CPK 255>>850 Continue IV fluids Check CPK in the morning  Leukemoid reaction Presented with WBC 24.8 Likely stress demargination UA negative for pyuria PCT < 0.10 Lactic acid 1.7 Monitor off antibiotic  Hypokalemia Replete IV and po Add KCl to IVF Check mag  Diabetes mellitus type 2, controlled, with complications ([place]) 09/14/8784 hemoglobin A1c 6.9 Start reduced dose Lantus NovoLog sliding scale  Gastroenteritis- (present on admission) Patient did have nausea, vomiting, and diarrhea on 07/27/2021 Appears to have resolved Start diet and monitor clinically Continue PPI  Hyperlipidemia associated with type 2 diabetes mellitus ([place])- (present on admission) Continue statin  Morbid obesity ([place])- (present on admission) BMI 35.42 Lifestyle modification  Hypertension associated with diabetes ([place])- (present on admission) Holding ARB/HCTZ combo due to soft blood pressure         Status is: Observation The patient will require care spanning > 2 midnights and should be moved to inpatient because: requiring IVF          Family Communication:   spouse updated at bedside 2/25  Consultants:  neurology  on phone  Code Status:  FULL   DVT Prophylaxis:  [place] Lovenox   Procedures: As Listed in Progress Note Above  Antibiotics: None       Subjective: Patient complains of some leg tenderness.  She denies any fevers, chills, headache, chest pain, shortness breath, cough, hemoptysis, vomiting, diarrhea, abdominal  pain.  Objective: Vitals:   07/29/21 1948 07/30/21 0015 07/30/21 0500 07/30/21 0540  BP: 115/69 97/68  101/62  Pulse: 73 85 78 74  Resp: 16 16 16    Temp: 97.9 F (36.6 C) 97.9 F (36.6 C) 98.4 F (36.9 C)   TempSrc: Oral  Oral   SpO2: 98% 96% 92%   Weight:   93.6 kg   Height:        Intake/Output Summary (Last 24 hours) at 07/30/2021 0830 Last data filed at 07/30/2021 0603 Gross per 24 hour  Intake 4141.5 ml  Output 600 ml  Net 3541.5 ml   Weight change:  Exam:  General:  Pt is alert, follows commands appropriately, not in acute distress HEENT: No icterus, No thrush, No neck mass, [place]/AT Cardiovascular: RRR, S1/S2, no rubs, no gallops Respiratory: Diminished breath sounds at the bases.  No wheezing.  Good air movement Abdomen: Soft/+BS, non tender, non distended, no guarding Extremities: No edema, No lymphangitis, No petechiae, No rashes, no synovitis Neuro:  CN II-XII intact, strength 4/5 in RUE, RLE, strength 4/5 LUE, LLE; sensation intact bilateral; no dysmetria; babinski equivocal    Data Reviewed: I have personally reviewed following labs and imaging studies Basic Metabolic Panel: Recent Labs  Lab 07/29/21 1111 07/30/21 0442  NA 140 142  K 3.5 3.1*  CL 106 111  CO2 25 26  GLUCOSE 110* 58*  BUN 15 11  CREATININE 0.70 0.75  CALCIUM 8.7* 8.0*  MG 2.3  --    Liver Function Tests: Recent Labs  Lab 07/29/21 1111  AST 26  ALT 24  ALKPHOS 69  BILITOT 1.2  PROT 7.3  ALBUMIN 4.1   Recent Labs  Lab 07/29/21 1111  LIPASE 38   Recent Labs  Lab 07/29/21 1111  AMMONIA <10   Coagulation Profile: No results for input(s): INR, PROTIME in the last 168 hours. CBC: Recent Labs  Lab 07/29/21 1111 07/30/21 0442  WBC 24.8* 11.4*  NEUTROABS 22.1*  --   HGB 14.0 11.8*  HCT 42.6 37.3  MCV 94.7 96.6  PLT 395 333   Cardiac Enzymes: Recent Labs  Lab 07/29/21 1111 07/30/21 0442  CKTOTAL 255* 850*   BNP: Invalid input(s): POCBNP CBG: Recent Labs   Lab 07/29/21 1022 07/29/21 2055 07/30/21 0611  GLUCAP 122* 117* 137*   HbA1C: Recent Labs    07/29/21 1111  HGBA1C 6.9*   Urine analysis:    Component Value Date/Time   COLORURINE YELLOW 07/29/2021 1038   APPEARANCEUR CLEAR 07/29/2021 1038   LABSPEC 1.025 07/29/2021 1038   PHURINE 5.5 07/29/2021 1038   GLUCOSEU >=500 (A) 07/29/2021 1038   HGBUR TRACE (A) 07/29/2021 1038   BILIRUBINUR NEGATIVE 07/29/2021 1038   KETONESUR 15 (A) 07/29/2021 1038   PROTEINUR NEGATIVE 07/29/2021 1038   NITRITE NEGATIVE 07/29/2021 1038   LEUKOCYTESUR TRACE (A) 07/29/2021 1038   Sepsis Labs: @LABRCNTIP (procalcitonin:4,lacticidven:4) ) Recent Results (from the past 240 hour(s))  Resp Panel by RT-PCR (Flu A&B, Covid) Nasopharyngeal Swab     Status: None   Collection Time: 07/29/21 10:38 AM   Specimen: Nasopharyngeal Swab; Nasopharyngeal(NP) swabs in vial transport medium  Result Value Ref Range Status  SARS Coronavirus 2 by RT PCR NEGATIVE NEGATIVE Final    Comment: (NOTE) SARS-CoV-2 target nucleic acids are NOT DETECTED.  The SARS-CoV-2 RNA is generally detectable in upper respiratory specimens during the acute phase of infection. The lowest concentration of SARS-CoV-2 viral copies this assay can detect is 138 copies/mL. A negative result does not preclude SARS-Cov-2 infection and should not be used as the sole basis for treatment or other patient management decisions. A negative result may occur with  improper specimen collection/handling, submission of specimen other than nasopharyngeal swab, presence of viral mutation(s) within the areas targeted by this assay, and inadequate number of viral copies(<138 copies/mL). A negative result must be combined with clinical observations, patient history, and epidemiological information. The expected result is Negative.  Fact Sheet for Patients:  EntrepreneurPulse.com.au  Fact Sheet for Healthcare Providers:   IncredibleEmployment.be  This test is no t yet approved or cleared by the [place] FDA and  has been authorized for detection and/or diagnosis of SARS-CoV-2 by FDA under an Emergency Use Authorization (EUA). This EUA will remain  in effect (meaning this test can be used) for the duration of the COVID-19 declaration under Section 564(b)(1) of the Act, 21 U.S.C.section 360bbb-3(b)(1), unless the authorization is terminated  or revoked sooner.       Influenza A by PCR NEGATIVE NEGATIVE Final   Influenza B by PCR NEGATIVE NEGATIVE Final    Comment: (NOTE) The Xpert Xpress SARS-CoV-2/FLU/RSV plus assay is intended as an aid in the diagnosis of influenza from Nasopharyngeal swab specimens and should not be used as a sole basis for treatment. Nasal washings and aspirates are unacceptable for Xpert Xpress SARS-CoV-2/FLU/RSV testing.  Fact Sheet for Patients: EntrepreneurPulse.com.au  Fact Sheet for Healthcare Providers: IncredibleEmployment.be  This test is not yet approved or cleared by the [place] FDA and has been authorized for detection and/or diagnosis of SARS-CoV-2 by FDA under an Emergency Use Authorization (EUA). This EUA will remain in effect (meaning this test can be used) for the duration of the COVID-19 declaration under Section 564(b)(1) of the Act, 21 U.S.C. section 360bbb-3(b)(1), unless the authorization is terminated or revoked.  Performed at [facility], [address] [place], [place] [address]      Scheduled Meds:  aspirin EC  81 mg Oral Daily   atorvastatin  10 mg Oral Daily   empagliflozin  25 mg Oral Daily   enoxaparin (LOVENOX) injection  40 mg Subcutaneous Q24H   insulin aspart  0-15 Units Subcutaneous TID WC   insulin aspart  0-5 Units Subcutaneous QHS   insulin glargine  20 Units Subcutaneous Daily   PARoxetine  30 mg Oral Daily   sodium chloride flush  3 mL Intravenous Q12H    Continuous Infusions:  azithromycin     cefTRIAXone (ROCEPHIN)  IV     lactated ringers 125 mL/hr at 07/30/21 2831    Procedures/Studies: CT Head Wo Contrast  Result Date: 07/29/2021 CLINICAL DATA:  Patient found on floor by husband. Nausea and vomiting beginning this morning. Possible neck trauma. Confusion. EXAM: CT HEAD WITHOUT CONTRAST CT CERVICAL SPINE WITHOUT CONTRAST TECHNIQUE: Multidetector CT imaging of the head and cervical spine was performed following the standard protocol without intravenous contrast. Multiplanar CT image reconstructions of the cervical spine were also generated. RADIATION DOSE REDUCTION: This exam was performed according to the departmental dose-optimization program which includes automated exposure control, adjustment of the mA and/or kV according to patient size and/or use of iterative reconstruction technique. COMPARISON:  None. FINDINGS: CT HEAD FINDINGS Brain: No evidence of acute infarction, hemorrhage, hydrocephalus, extra-axial collection or mass lesion/mass effect. Vascular: No hyperdense vessel or unexpected calcification. Skull: Normal. Negative for fracture or focal lesion. Sinuses/Orbits: No acute finding. Other: None. CT CERVICAL SPINE FINDINGS Alignment: Normal. Skull base and vertebrae: Vertebral body heights are maintained. There is mild spondylosis throughout the cervical spine. No acute fracture. Atlantoaxial articulation is unremarkable. Bilateral neural foraminal narrowing at the C6-7 level. Mild left-sided neural foraminal narrowing at the C3-4 level. Soft tissues and spinal canal: Prevertebral soft tissues are normal. Subtle canal stenosis at the C6-7 level. Disc levels: Minimal disc space narrowing at the C5-6 and C6-7 levels. Upper chest: No acute findings. Other: None. IMPRESSION: 1. No acute intracranial findings. 2. No acute cervical spine fracture or subluxation. 3. Mild spondylosis throughout the cervical spine with minimal disc disease at the  C5-6 and C6-7 levels. Bilateral neural foraminal narrowing at the C6-7 level and mild left-sided neural foraminal narrowing at the C3-4 level. Subtle canal stenosis at the C6-7 level. Electronically Signed   By: Marin Olp M.D.   On: 07/29/2021 12:14   CT Cervical Spine Wo Contrast  Result Date: 07/29/2021 CLINICAL DATA:  Patient found on floor by husband. Nausea and vomiting beginning this morning. Possible neck trauma. Confusion. EXAM: CT HEAD WITHOUT CONTRAST CT CERVICAL SPINE WITHOUT CONTRAST TECHNIQUE: Multidetector CT imaging of the head and cervical spine was performed following the standard protocol without intravenous contrast. Multiplanar CT image reconstructions of the cervical spine were also generated. RADIATION DOSE REDUCTION: This exam was performed according to the departmental dose-optimization program which includes automated exposure control, adjustment of the mA and/or kV according to patient size and/or use of iterative reconstruction technique. COMPARISON:  None. FINDINGS: CT HEAD FINDINGS Brain: No evidence of acute infarction, hemorrhage, hydrocephalus, extra-axial collection or mass lesion/mass effect. Vascular: No hyperdense vessel or unexpected calcification. Skull: Normal. Negative for fracture or focal lesion. Sinuses/Orbits: No acute finding. Other: None. CT CERVICAL SPINE FINDINGS Alignment: Normal. Skull base and vertebrae: Vertebral body heights are maintained. There is mild spondylosis throughout the cervical spine. No acute fracture. Atlantoaxial articulation is unremarkable. Bilateral neural foraminal narrowing at the C6-7 level. Mild left-sided neural foraminal narrowing at the C3-4 level. Soft tissues and spinal canal: Prevertebral soft tissues are normal. Subtle canal stenosis at the C6-7 level. Disc levels: Minimal disc space narrowing at the C5-6 and C6-7 levels. Upper chest: No acute findings. Other: None. IMPRESSION: 1. No acute intracranial findings. 2. No acute  cervical spine fracture or subluxation. 3. Mild spondylosis throughout the cervical spine with minimal disc disease at the C5-6 and C6-7 levels. Bilateral neural foraminal narrowing at the C6-7 level and mild left-sided neural foraminal narrowing at the C3-4 level. Subtle canal stenosis at the C6-7 level. Electronically Signed   By: Marin Olp M.D.   On: 07/29/2021 12:14   DG Chest Port 1 View  Result Date: 07/29/2021 CLINICAL DATA:  Fall, confusion EXAM: PORTABLE CHEST 1 VIEW COMPARISON:  None. FINDINGS: The heart size and mediastinal contours are within normal limits. Slightly increased interstitial markings within the left lung base. Lungs are otherwise clear. No pleural effusion or pneumothorax. The visualized skeletal structures are unremarkable. IMPRESSION: Slightly increased interstitial markings within the left lung base, which could represent atelectasis versus developing infiltrate. Otherwise, no acute cardiopulmonary findings. Electronically Signed   By: Davina Poke D.O.   On: 07/29/2021 11:03   MM 3D SCREEN BREAST BILATERAL  Result Date: 07/20/2021  CLINICAL DATA:  Screening. EXAM: DIGITAL SCREENING BILATERAL MAMMOGRAM WITH TOMOSYNTHESIS AND CAD TECHNIQUE: Bilateral screening digital craniocaudal and mediolateral oblique mammograms were obtained. Bilateral screening digital breast tomosynthesis was performed. The images were evaluated with computer-aided detection. COMPARISON:  Previous exam(s). ACR Breast Density Category b: There are scattered areas of fibroglandular density. FINDINGS: There are no findings suspicious for malignancy. IMPRESSION: No mammographic evidence of malignancy. A result letter of this screening mammogram will be mailed directly to the patient. RECOMMENDATION: Screening mammogram in one year. (Code:SM-B-01Y) BI-RADS CATEGORY  1: Negative. Electronically Signed   By: Everlean Alstrom M.D.   On: 07/20/2021 09:08    Orson Eva, DO  [facility]  If 7PM-7AM,  please contact night-coverage [email or web address] Password [email or web address] 07/30/2021, 8:30 AM   LOS: 0 days

## 2021-07-30 NOTE — Assessment & Plan Note (Addendum)
Traumatic from fall CPK 255>>850>>583>>306 Continued IV fluids

## 2021-07-31 DIAGNOSIS — I951 Orthostatic hypotension: Secondary | ICD-10-CM

## 2021-07-31 DIAGNOSIS — E1169 Type 2 diabetes mellitus with other specified complication: Secondary | ICD-10-CM | POA: Diagnosis not present

## 2021-07-31 DIAGNOSIS — E876 Hypokalemia: Secondary | ICD-10-CM

## 2021-07-31 DIAGNOSIS — D72823 Leukemoid reaction: Secondary | ICD-10-CM | POA: Diagnosis not present

## 2021-07-31 LAB — BASIC METABOLIC PANEL
Anion gap: 5 (ref 5–15)
BUN: 8 mg/dL (ref 8–23)
CO2: 27 mmol/L (ref 22–32)
Calcium: 8.3 mg/dL — ABNORMAL LOW (ref 8.9–10.3)
Chloride: 109 mmol/L (ref 98–111)
Creatinine, Ser: 0.72 mg/dL (ref 0.44–1.00)
GFR, Estimated: >60 mL/min (ref >60)
Glucose, Bld: 118 mg/dL — ABNORMAL HIGH (ref 70–99)
Potassium: 4.1 mmol/L (ref 3.5–5.1)
Sodium: 141 mmol/L (ref 135–145)

## 2021-07-31 LAB — GLUCOSE, CAPILLARY
Glucose-Capillary: 121 mg/dL — ABNORMAL HIGH (ref 70–99)
Glucose-Capillary: 106 mg/dL — ABNORMAL HIGH (ref 70–99)
Glucose-Capillary: 118 mg/dL — ABNORMAL HIGH (ref 70–99)
Glucose-Capillary: 149 mg/dL — ABNORMAL HIGH (ref 70–99)
Glucose-Capillary: 115 mg/dL — ABNORMAL HIGH (ref 70–99)

## 2021-07-31 LAB — CBC
HCT: 36.9 % (ref 36.0–46.0)
Hemoglobin: 12.2 g/dL (ref 12.0–15.0)
MCH: 32.4 pg (ref 26.0–34.0)
MCHC: 33.1 g/dL (ref 30.0–36.0)
MCV: 98.1 fL (ref 80.0–100.0)
Platelets: 345 10*3/uL (ref 150–400)
RBC: 3.76 MIL/uL — ABNORMAL LOW (ref 3.87–5.11)
RDW: 13.3 % (ref 11.5–15.5)
WBC: 10.3 10*3/uL (ref 4.0–10.5)
nRBC: 0 % (ref 0.0–0.2)

## 2021-07-31 LAB — MAGNESIUM: Magnesium: 1.8 mg/dL (ref 1.7–2.4)

## 2021-07-31 LAB — CK: Total CK: 583 U/L — ABNORMAL HIGH (ref 38–234)

## 2021-07-31 NOTE — Progress Notes (Signed)
PROGRESS NOTE  Carrie [name] OIN:[id number] DOB: [birth year] DOA: 07/29/2021 PCP: [name], DO  Brief History:  61 year old female with a history of diabetes mellitus, depression, gastroparesis, hypertension, hyperlipidemia presenting after a syncopal episode.  The patient was getting ready for work in the bathroom on 07/29/2021 when she lost consciousness and fell to the ground.  Notably, the patient has had nausea, vomiting, and diarrhea on 07/27/2021.  She stated that her entire family had some type of GI illness.  She did not have any fevers, chills but did have some abdominal cramping.  She states that she has gradually improved and has not had any further diarrhea since 07/27/2021 although she did have 1 episode of emesis in the ambulance to the hospital. Regarding her syncopal episode, the patient stated that she has been feeling dizzy for the past 2 days.  She remembers waking up and calling her husband on her cell phone.  She stated that she felt confused at that time and did not remember much of what happened even after she woke up.  Apparently, the patient was somewhat confused when her husband found her.  EMS was activated.  Apparently the patient was orthostatic and given a 500 cc bolus via EMS.  The patient stated that she bit her lip and did have bowel and bladder incontinence.  She states that she has not had any syncope or seizure in the past.  She denies any new medications in the past month.  She does not use any illicit drugs and drinks alcohol socially.  She quit smoking 18 years ago after 30-pack-year history.  She denies any chest pain, shortness of breath, hemoptysis, dysuria, hematuria.  CT brain neg.  She states that her nausea, vomiting, and diarrhea have improved.  In the ED, WBC 24.8, hemoglobin 14.0, platelets 395.  BMP showed sodium 140, potassium 4.5, bicarbonate 25, serum creatinine 0.70.  CPK 255>>850.  Pt started on IV fluid.      Assessment/Plan:   Principal Problem:   Syncope Active Problems:   Rhabdomyolysis   Leukemoid reaction   Hypertension associated with diabetes ([place])   Morbid obesity ([place])   Hyperlipidemia associated with type 2 diabetes mellitus ([place])   Gastroenteritis   Diabetes mellitus type 2, controlled, with complications ([place])   Hypokalemia  Assessment and Plan: * Syncope- (present on admission) Concerned about seizure Certainly, volume depletion and orthostasis contributed to her syncope Patient may also have a component of diabetic dysautonomia Case discussed with neurology--okay for close observation and outpatient work-up if CT brain unremarkable; hold off on AEDs Monitor clinically for another 24 hours Repeat orthostatics Echo EF 60-65%, no WMA, no major valvular abnormaliites Continue IV fluids -UDS neg I discussed with patient that she cannot drive until she follow up with neurology and is cleared  Rhabdomyolysis Traumatic from fall CPK 255>>850>>583 Continue IV fluids Check CPK in the morning  Leukemoid reaction Presented with WBC 24.8>>10.3 Likely stress demargination UA negative for pyuria PCT < 0.10 Lactic acid 1.7 Monitor off antibiotic  Hypokalemia Replete IV and po Add KCl to IVF Check mag--1.8  Diabetes mellitus type 2, controlled, with complications ([place]) 12/11/6281 hemoglobin A1c 6.9 Start reduced dose Lantus NovoLog sliding scale CBGs controlled  Gastroenteritis- (present on admission) Patient did have nausea, vomiting, and diarrhea on 07/27/2021 Appears to have resolved Start diet and monitor clinically>>>tolerating Continue PPI  Hyperlipidemia associated with type 2 diabetes mellitus ([place])- (present on admission) Continue  statin  Morbid obesity ([place])- (present on admission) BMI 35.42 Lifestyle modification  Hypertension associated with diabetes ([place])- (present on admission) Holding ARB/HCTZ combo due to soft blood  pressure       Status is: inpatient The patient will require care spanning > 2 midnights and should be moved to inpatient because: requiring IVF                   Family Communication:   spouse updated at bedside 2/25   Consultants:  neurology on phone   Code Status:  FULL    DVT Prophylaxis:  [place] Lovenox     Procedures: As Listed in Progress Note Above   Antibiotics: None           Subjective:  Patient denies fevers, chills, headache, chest pain, dyspnea, nausea, vomiting, diarrhea, abdominal pain, dysuria, hematuria, hematochezia, and melena. States her leg pain is improving Objective: Vitals:   07/30/21 2056 07/31/21 0500 07/31/21 0545 07/31/21 1243  BP: (!) 103/58  (!) 107/58 (!) 142/78  Pulse: 80  78 74  Resp: 18  18 15   Temp: 98.3 F (36.8 C)  98.4 F (36.9 C)   TempSrc: Oral  Oral   SpO2: 95%  100% 97%  Weight:  92 kg    Height:        Intake/Output Summary (Last 24 hours) at 07/31/2021 1657 Last data filed at 07/31/2021 1300 Gross per 24 hour  Intake 1080 ml  Output 2500 ml  Net -1420 ml   Weight change: 4.909 kg Exam:  General:  Pt is alert, follows commands appropriately, not in acute distress HEENT: No icterus, No thrush, No neck mass, [place]/AT Cardiovascular: RRR, S1/S2, no rubs, no gallops Respiratory: CTA bilaterally, no wheezing, no crackles, no rhonchi Abdomen: Soft/+BS, non tender, non distended, no guarding Extremities: No edema, No lymphangitis, No petechiae, No rashes, no synovitis   Data Reviewed: I have personally reviewed following labs and imaging studies Basic Metabolic Panel: Recent Labs  Lab 07/29/21 1111 07/30/21 0442 07/31/21 0520  NA 140 142 141  K 3.5 3.1* 4.1  CL 106 111 109  CO2 25 26 27   GLUCOSE 110* 58* 118*  BUN 15 11 8   CREATININE 0.70 0.75 0.72  CALCIUM 8.7* 8.0* 8.3*  MG 2.3  --  1.8   Liver Function Tests: Recent Labs  Lab 07/29/21 1111  AST 26  ALT 24  ALKPHOS 69  BILITOT 1.2   PROT 7.3  ALBUMIN 4.1   Recent Labs  Lab 07/29/21 1111  LIPASE 38   Recent Labs  Lab 07/29/21 1111  AMMONIA <10   Coagulation Profile: No results for input(s): INR, PROTIME in the last 168 hours. CBC: Recent Labs  Lab 07/29/21 1111 07/30/21 0442 07/31/21 0520  WBC 24.8* 11.4* 10.3  NEUTROABS 22.1*  --   --   HGB 14.0 11.8* 12.2  HCT 42.6 37.3 36.9  MCV 94.7 96.6 98.1  PLT 395 333 345   Cardiac Enzymes: Recent Labs  Lab 07/29/21 1111 07/30/21 0442 07/31/21 0520  CKTOTAL 255* 850* 583*   BNP: Invalid input(s): POCBNP CBG: Recent Labs  Lab 07/30/21 2057 07/31/21 0529 07/31/21 0715 07/31/21 1111 07/31/21 1602  GLUCAP 128* 121* 106* 118* 149*   HbA1C: Recent Labs    07/29/21 1111  HGBA1C 6.9*   Urine analysis:    Component Value Date/Time   COLORURINE YELLOW 07/29/2021 1038   APPEARANCEUR CLEAR 07/29/2021 1038   LABSPEC 1.025 07/29/2021 1038   PHURINE 5.5  07/29/2021 1038   GLUCOSEU >=500 (A) 07/29/2021 1038   HGBUR TRACE (A) 07/29/2021 1038   BILIRUBINUR NEGATIVE 07/29/2021 1038   KETONESUR 15 (A) 07/29/2021 1038   PROTEINUR NEGATIVE 07/29/2021 1038   NITRITE NEGATIVE 07/29/2021 1038   LEUKOCYTESUR TRACE (A) 07/29/2021 1038   Sepsis Labs: @LABRCNTIP (procalcitonin:4,lacticidven:4) ) Recent Results (from the past 240 hour(s))  Resp Panel by RT-PCR (Flu A&B, Covid) Nasopharyngeal Swab     Status: None   Collection Time: 07/29/21 10:38 AM   Specimen: Nasopharyngeal Swab; Nasopharyngeal(NP) swabs in vial transport medium  Result Value Ref Range Status   SARS Coronavirus 2 by RT PCR NEGATIVE NEGATIVE Final    Comment: (NOTE) SARS-CoV-2 target nucleic acids are NOT DETECTED.  The SARS-CoV-2 RNA is generally detectable in upper respiratory specimens during the acute phase of infection. The lowest concentration of SARS-CoV-2 viral copies this assay can detect is 138 copies/mL. A negative result does not preclude SARS-Cov-2 infection and should  not be used as the sole basis for treatment or other patient management decisions. A negative result may occur with  improper specimen collection/handling, submission of specimen other than nasopharyngeal swab, presence of viral mutation(s) within the areas targeted by this assay, and inadequate number of viral copies(<138 copies/mL). A negative result must be combined with clinical observations, patient history, and epidemiological information. The expected result is Negative.  Fact Sheet for Patients:  EntrepreneurPulse.com.au  Fact Sheet for Healthcare Providers:  IncredibleEmployment.be  This test is no t yet approved or cleared by the [place] FDA and  has been authorized for detection and/or diagnosis of SARS-CoV-2 by FDA under an Emergency Use Authorization (EUA). This EUA will remain  in effect (meaning this test can be used) for the duration of the COVID-19 declaration under Section 564(b)(1) of the Act, 21 U.S.C.section 360bbb-3(b)(1), unless the authorization is terminated  or revoked sooner.       Influenza A by PCR NEGATIVE NEGATIVE Final   Influenza B by PCR NEGATIVE NEGATIVE Final    Comment: (NOTE) The Xpert Xpress SARS-CoV-2/FLU/RSV plus assay is intended as an aid in the diagnosis of influenza from Nasopharyngeal swab specimens and should not be used as a sole basis for treatment. Nasal washings and aspirates are unacceptable for Xpert Xpress SARS-CoV-2/FLU/RSV testing.  Fact Sheet for Patients: EntrepreneurPulse.com.au  Fact Sheet for Healthcare Providers: IncredibleEmployment.be  This test is not yet approved or cleared by the [place] FDA and has been authorized for detection and/or diagnosis of SARS-CoV-2 by FDA under an Emergency Use Authorization (EUA). This EUA will remain in effect (meaning this test can be used) for the duration of the COVID-19 declaration under  Section 564(b)(1) of the Act, 21 U.S.C. section 360bbb-3(b)(1), unless the authorization is terminated or revoked.  Performed at [facility], [address] [place], [place] [address]      Scheduled Meds:  aspirin EC  81 mg Oral Daily   atorvastatin  10 mg Oral Daily   empagliflozin  25 mg Oral Daily   enoxaparin (LOVENOX) injection  40 mg Subcutaneous Q24H   insulin aspart  0-15 Units Subcutaneous TID WC   insulin aspart  0-5 Units Subcutaneous QHS   insulin glargine-yfgn  20 Units Subcutaneous Daily   PARoxetine  30 mg Oral Daily   sodium chloride flush  3 mL Intravenous Q12H   Continuous Infusions:  0.9 % NaCl with KCl 20 mEq / L 100 mL/hr at 07/31/21 1228    Procedures/Studies: CT Head Wo Contrast  Result  Date: 07/29/2021 CLINICAL DATA:  Patient found on floor by husband. Nausea and vomiting beginning this morning. Possible neck trauma. Confusion. EXAM: CT HEAD WITHOUT CONTRAST CT CERVICAL SPINE WITHOUT CONTRAST TECHNIQUE: Multidetector CT imaging of the head and cervical spine was performed following the standard protocol without intravenous contrast. Multiplanar CT image reconstructions of the cervical spine were also generated. RADIATION DOSE REDUCTION: This exam was performed according to the departmental dose-optimization program which includes automated exposure control, adjustment of the mA and/or kV according to patient size and/or use of iterative reconstruction technique. COMPARISON:  None. FINDINGS: CT HEAD FINDINGS Brain: No evidence of acute infarction, hemorrhage, hydrocephalus, extra-axial collection or mass lesion/mass effect. Vascular: No hyperdense vessel or unexpected calcification. Skull: Normal. Negative for fracture or focal lesion. Sinuses/Orbits: No acute finding. Other: None. CT CERVICAL SPINE FINDINGS Alignment: Normal. Skull base and vertebrae: Vertebral body heights are maintained. There is mild spondylosis throughout the cervical spine. No acute fracture.  Atlantoaxial articulation is unremarkable. Bilateral neural foraminal narrowing at the C6-7 level. Mild left-sided neural foraminal narrowing at the C3-4 level. Soft tissues and spinal canal: Prevertebral soft tissues are normal. Subtle canal stenosis at the C6-7 level. Disc levels: Minimal disc space narrowing at the C5-6 and C6-7 levels. Upper chest: No acute findings. Other: None. IMPRESSION: 1. No acute intracranial findings. 2. No acute cervical spine fracture or subluxation. 3. Mild spondylosis throughout the cervical spine with minimal disc disease at the C5-6 and C6-7 levels. Bilateral neural foraminal narrowing at the C6-7 level and mild left-sided neural foraminal narrowing at the C3-4 level. Subtle canal stenosis at the C6-7 level. Electronically Signed   By: Marin Olp M.D.   On: 07/29/2021 12:14   CT Cervical Spine Wo Contrast  Result Date: 07/29/2021 CLINICAL DATA:  Patient found on floor by husband. Nausea and vomiting beginning this morning. Possible neck trauma. Confusion. EXAM: CT HEAD WITHOUT CONTRAST CT CERVICAL SPINE WITHOUT CONTRAST TECHNIQUE: Multidetector CT imaging of the head and cervical spine was performed following the standard protocol without intravenous contrast. Multiplanar CT image reconstructions of the cervical spine were also generated. RADIATION DOSE REDUCTION: This exam was performed according to the departmental dose-optimization program which includes automated exposure control, adjustment of the mA and/or kV according to patient size and/or use of iterative reconstruction technique. COMPARISON:  None. FINDINGS: CT HEAD FINDINGS Brain: No evidence of acute infarction, hemorrhage, hydrocephalus, extra-axial collection or mass lesion/mass effect. Vascular: No hyperdense vessel or unexpected calcification. Skull: Normal. Negative for fracture or focal lesion. Sinuses/Orbits: No acute finding. Other: None. CT CERVICAL SPINE FINDINGS Alignment: Normal. Skull base and  vertebrae: Vertebral body heights are maintained. There is mild spondylosis throughout the cervical spine. No acute fracture. Atlantoaxial articulation is unremarkable. Bilateral neural foraminal narrowing at the C6-7 level. Mild left-sided neural foraminal narrowing at the C3-4 level. Soft tissues and spinal canal: Prevertebral soft tissues are normal. Subtle canal stenosis at the C6-7 level. Disc levels: Minimal disc space narrowing at the C5-6 and C6-7 levels. Upper chest: No acute findings. Other: None. IMPRESSION: 1. No acute intracranial findings. 2. No acute cervical spine fracture or subluxation. 3. Mild spondylosis throughout the cervical spine with minimal disc disease at the C5-6 and C6-7 levels. Bilateral neural foraminal narrowing at the C6-7 level and mild left-sided neural foraminal narrowing at the C3-4 level. Subtle canal stenosis at the C6-7 level. Electronically Signed   By: Marin Olp M.D.   On: 07/29/2021 12:14   DG Chest [facility] 1 View  Result  Date: 07/29/2021 CLINICAL DATA:  Fall, confusion EXAM: PORTABLE CHEST 1 VIEW COMPARISON:  None. FINDINGS: The heart size and mediastinal contours are within normal limits. Slightly increased interstitial markings within the left lung base. Lungs are otherwise clear. No pleural effusion or pneumothorax. The visualized skeletal structures are unremarkable. IMPRESSION: Slightly increased interstitial markings within the left lung base, which could represent atelectasis versus developing infiltrate. Otherwise, no acute cardiopulmonary findings. Electronically Signed   By: Davina Poke D.O.   On: 07/29/2021 11:03   MM 3D SCREEN BREAST BILATERAL  Result Date: 07/20/2021 CLINICAL DATA:  Screening. EXAM: DIGITAL SCREENING BILATERAL MAMMOGRAM WITH TOMOSYNTHESIS AND CAD TECHNIQUE: Bilateral screening digital craniocaudal and mediolateral oblique mammograms were obtained. Bilateral screening digital breast tomosynthesis was performed. The images were  evaluated with computer-aided detection. COMPARISON:  Previous exam(s). ACR Breast Density Category b: There are scattered areas of fibroglandular density. FINDINGS: There are no findings suspicious for malignancy. IMPRESSION: No mammographic evidence of malignancy. A result letter of this screening mammogram will be mailed directly to the patient. RECOMMENDATION: Screening mammogram in one year. (Code:SM-B-01Y) BI-RADS CATEGORY  1: Negative. Electronically Signed   By: Everlean Alstrom M.D.   On: 07/20/2021 09:08   ECHOCARDIOGRAM COMPLETE  Result Date: 07/30/2021    ECHOCARDIOGRAM REPORT   Patient Name:   SHANDORA KOOGLER Date of Exam: 07/30/2021 Medical Rec #:  [id number]     Height:       64.0 in Accession #:    [id number]    Weight:       206.3 lb Date of Birth:  1961/03/21      BSA:          1.983 m Patient Age:    73 years      BP:           97/48 mmHg Patient Gender: F             HR:           78 bpm. Exam Location:  [facility] Procedure: 2D Echo, Cardiac Doppler and Color Doppler Indications:    Syncope  History:        Patient has no prior history of Echocardiogram examinations.  Sonographer:    Merrie Roof RDCS Referring Phys: [place]  1. Left ventricular ejection fraction, by estimation, is 60 to 65%. The left ventricle has normal function. The left ventricle has no regional wall motion abnormalities. There is mild concentric left ventricular hypertrophy. Left ventricular diastolic parameters were normal.  2. Right ventricular systolic function is normal. The right ventricular size is normal. There is normal pulmonary artery systolic pressure.  3. The mitral valve is normal in structure. No evidence of mitral valve regurgitation. No evidence of mitral stenosis.  4. The aortic valve is normal in structure. Aortic valve regurgitation is not visualized. No aortic stenosis is present.  5. The inferior vena cava is normal in size with greater than 50% respiratory variability, suggesting  right atrial pressure of 3 mmHg. FINDINGS  Left Ventricle: Left ventricular ejection fraction, by estimation, is 60 to 65%. The left ventricle has normal function. The left ventricle has no regional wall motion abnormalities. The left ventricular internal cavity size was normal in size. There is  mild concentric left ventricular hypertrophy. Left ventricular diastolic parameters were normal. Normal left ventricular filling pressure. Right Ventricle: The right ventricular size is normal. No increase in right ventricular wall thickness. Right ventricular systolic function is normal. There is normal  pulmonary artery systolic pressure. The tricuspid regurgitant velocity is 2.54 m/s, and  with an assumed right atrial pressure of 3 mmHg, the estimated right ventricular systolic pressure is 60.6 mmHg. Left Atrium: Left atrial size was normal in size. Right Atrium: Right atrial size was normal in size. Pericardium: There is no evidence of pericardial effusion. Mitral Valve: The mitral valve is normal in structure. No evidence of mitral valve regurgitation. No evidence of mitral valve stenosis. Tricuspid Valve: The tricuspid valve is normal in structure. Tricuspid valve regurgitation is trivial. No evidence of tricuspid stenosis. Aortic Valve: The aortic valve is normal in structure. Aortic valve regurgitation is not visualized. No aortic stenosis is present. Aortic valve mean gradient measures 4.0 mmHg. Aortic valve peak gradient measures 6.8 mmHg. Aortic valve area, by VTI measures 2.35 cm. Pulmonic Valve: The pulmonic valve was normal in structure. Pulmonic valve regurgitation is not visualized. No evidence of pulmonic stenosis. Aorta: The aortic root is normal in size and structure. Venous: The inferior vena cava is normal in size with greater than 50% respiratory variability, suggesting right atrial pressure of 3 mmHg. IAS/Shunts: No atrial level shunt detected by color flow Doppler.  LEFT VENTRICLE PLAX 2D LVIDd:          3.70 cm   Diastology LV PW:         1.10 cm   LV e' medial:    10.30 cm/s LV IVS:        1.20 cm   LV E/e' medial:  11.0 LVOT diam:     2.00 cm   LV e' lateral:   10.20 cm/s LV SV:         63        LV E/e' lateral: 11.1 LV SV Index:   32 LVOT Area:     3.14 cm  RIGHT VENTRICLE RV Basal diam:  2.80 cm LEFT ATRIUM             Index        RIGHT ATRIUM           Index LA diam:        3.90 cm 1.97 cm/m   RA Area:     13.10 cm LA Vol (A2C):   33.8 ml 17.05 ml/m  RA Volume:   31.40 ml  15.84 ml/m LA Vol (A4C):   59.3 ml 29.91 ml/m LA Biplane Vol: 47.2 ml 23.81 ml/m  AORTIC VALVE AV Area (Vmax):    2.34 cm AV Area (Vmean):   2.35 cm AV Area (VTI):     2.35 cm AV Vmax:           130.00 cm/s AV Vmean:          88.400 cm/s AV VTI:            0.270 m AV Peak Grad:      6.8 mmHg AV Mean Grad:      4.0 mmHg LVOT Vmax:         97.00 cm/s LVOT Vmean:        66.000 cm/s LVOT VTI:          0.202 m LVOT/AV VTI ratio: 0.75  AORTA Ao Root diam: 3.00 cm Ao Asc diam:  3.00 cm MITRAL VALVE                TRICUSPID VALVE MV Area (PHT): 4.21 cm     TR Peak grad:   25.8 mmHg MV Decel Time: 180 msec  TR Vmax:        254.00 cm/s MV E velocity: 113.00 cm/s MV A velocity: 88.80 cm/s   SHUNTS MV E/A ratio:  1.27         Systemic VTI:  0.20 m                             Systemic Diam: 2.00 cm Fransico Him MD Electronically signed by Fransico Him MD Signature Date/Time: 07/30/2021/11:49:24 AM    Final     Orson Eva, DO  [facility]  If 7PM-7AM, please contact night-coverage [email or web address] Password [facility] 07/31/2021, 4:57 PM   LOS: 1 day

## 2021-07-31 NOTE — TOC Progression Note (Signed)
Transition of Care [facility]) - Progression Note    Patient Details  Name: JACKQULYN MENDEL MRN: [id number] Date of Birth: 1961/02/07  Transition of Care [facility]) CM/SW Contact  Salome Arnt, [place] Phone Number: 07/31/2021, 10:55 AM  Clinical Narrative:    Transition of Care [facility]) Screening Note   Patient Details  Name: TYRESA PRINDIVILLE Date of Birth: 09-Aug-1960   Transition of Care [facility]) CM/SW Contact:    Salome Arnt, [place] Phone Number: 07/31/2021, 10:55 AM    Transition of Care Department [facility]) has reviewed patient and no TOC needs have been identified at this time. We will continue to monitor patient advancement through interdisciplinary progression rounds. If new patient transition needs arise, please place a TOC consult.           Expected Discharge Plan and Services                                                 Social Determinants of Health (SDOH) Interventions    Readmission Risk Interventions No flowsheet data found.

## 2021-08-01 ENCOUNTER — Inpatient Hospital Stay (HOSPITAL_COMMUNITY): Payer: BC Managed Care – PPO

## 2021-08-01 ENCOUNTER — Inpatient Hospital Stay (HOSPITAL_COMMUNITY)
Admit: 2021-08-01 | Discharge: 2021-08-01 | Disposition: A | Discharge status: Home / Self Care | Payer: BC Managed Care – PPO | Attending: Internal Medicine | Admitting: Internal Medicine

## 2021-08-01 DIAGNOSIS — R55 Syncope and collapse: Secondary | ICD-10-CM

## 2021-08-01 DIAGNOSIS — E876 Hypokalemia: Secondary | ICD-10-CM | POA: Diagnosis not present

## 2021-08-01 DIAGNOSIS — K529 Noninfective gastroenteritis and colitis, unspecified: Secondary | ICD-10-CM | POA: Diagnosis not present

## 2021-08-01 DIAGNOSIS — D72823 Leukemoid reaction: Secondary | ICD-10-CM | POA: Diagnosis not present

## 2021-08-01 LAB — BASIC METABOLIC PANEL
Anion gap: 7 (ref 5–15)
BUN: 8 mg/dL (ref 8–23)
CO2: 26 mmol/L (ref 22–32)
Calcium: 8.6 mg/dL — ABNORMAL LOW (ref 8.9–10.3)
Chloride: 107 mmol/L (ref 98–111)
Creatinine, Ser: 0.68 mg/dL (ref 0.44–1.00)
GFR, Estimated: >60 mL/min (ref >60)
Glucose, Bld: 124 mg/dL — ABNORMAL HIGH (ref 70–99)
Potassium: 3.9 mmol/L (ref 3.5–5.1)
Sodium: 140 mmol/L (ref 135–145)

## 2021-08-01 LAB — LEGIONELLA PNEUMOPHILA SEROGP 1 UR AG: L. pneumophila Serogp 1 Ur Ag: NEGATIVE

## 2021-08-01 LAB — GLUCOSE, CAPILLARY
Glucose-Capillary: 119 mg/dL — ABNORMAL HIGH (ref 70–99)
Glucose-Capillary: 130 mg/dL — ABNORMAL HIGH (ref 70–99)
Glucose-Capillary: 129 mg/dL — ABNORMAL HIGH (ref 70–99)
Glucose-Capillary: 117 mg/dL — ABNORMAL HIGH (ref 70–99)

## 2021-08-01 LAB — MAGNESIUM: Magnesium: 1.9 mg/dL (ref 1.7–2.4)

## 2021-08-01 LAB — CK: Total CK: 306 U/L — ABNORMAL HIGH (ref 38–234)

## 2021-08-01 NOTE — Discharge Summary (Signed)
Physician Discharge Summary   Patient: Carrie [name] MRN: [id number] DOB: [birth year]  Admit date:     07/29/2021  Discharge date: 08/01/21  Discharge Physician: Shanon Brow Ella Golomb   PCP: [name], DO   Recommendations at discharge:   Please follow up with primary care provider within 1-2 weeks  Please repeat BMP and CBC in one week    Hospital Course: 61 year old female with a history of diabetes mellitus, depression, gastroparesis, hypertension, hyperlipidemia presenting after a syncopal episode.  The patient was getting ready for work in the bathroom on 07/29/2021 when she lost consciousness and fell to the ground.  Notably, the patient has had nausea, vomiting, and diarrhea on 07/27/2021.  She stated that her entire family had some type of GI illness.  She did not have any fevers, chills but did have some abdominal cramping.  She states that she has gradually improved and has not had any further diarrhea since 07/27/2021 although she did have 1 episode of emesis in the ambulance to the hospital. Regarding her syncopal episode, the patient stated that she has been feeling dizzy for the past 2 days.  She remembers waking up and calling her husband on her cell phone.  She stated that she felt confused at that time and did not remember much of what happened even after she woke up.  Apparently, the patient was somewhat confused when her husband found her.  EMS was activated.  Apparently the patient was orthostatic and given a 500 cc bolus via EMS.  The patient stated that she bit her lip and did have bowel and bladder incontinence.  She states that she has not had any syncope or seizure in the past.  She denies any new medications in the past month.  She does not use any illicit drugs and drinks alcohol socially.  She quit smoking 18 years ago after 30-pack-year history.  She denies any chest pain, shortness of breath, hemoptysis, dysuria, hematuria.  CT brain neg.  She states that her nausea, vomiting,  and diarrhea have improved.  In the ED, WBC 24.8, hemoglobin 14.0, platelets 395.  BMP showed sodium 140, potassium 4.5, bicarbonate 25, serum creatinine 0.70.  CPK 255>>850.  Pt started on IV fluid.    Assessment and Plan: * Syncope- (present on admission) Concerned about seizure Certainly, volume depletion and orthostasis contributed to her syncope Patient may also have a component of diabetic dysautonomia Case discussed with neurology--okay for close observation and outpatient work-up if CT brain unremarkable; hold off on AEDs Echo EF 60-65%, no WMA, no major valvular abnormaliites Continued IV fluids>>synmptomatically improved -UDS neg -no seizures during hospitalization -2/27 MRI brain--Minor burden of nonspecific gliosis/demyelination in the cerebral white matter I discussed with patient that she cannot drive until she follow up with neurology and is cleared EEG results pending at time of d/c Outpatient neurology follow up--referral made--pt has appt on 08/04/21  Rhabdomyolysis Traumatic from fall CPK 255>>850>>583>>306 Continued IV fluids  Leukemoid reaction Presented with WBC 24.8>>10.3 Likely stress demargination UA negative for pyuria PCT < 0.10 Lactic acid 1.7 Monitor off antibiotic--pt remained stable  Hypokalemia Replete IV and po Add KCl to IVF Check mag--1.8  Diabetes mellitus type 2, controlled, with complications ([place]) 08/26/4008 hemoglobin A1c 6.9 Start reduced dose Lantus NovoLog sliding scale CBGs controlled  Gastroenteritis- (present on admission) Patient did have nausea, vomiting, and diarrhea on 07/27/2021 Appears to have resolved Start diet and monitor clinically>>>tolerating Continue PPI  Hyperlipidemia associated with type 2 diabetes mellitus ([place])- (  present on admission) Continue statin  Morbid obesity ([place])- (present on admission) BMI 35.42 Lifestyle modification  Hypertension associated with diabetes ([place])- (present on  admission) Holding ARB/HCTZ combo due to soft blood pressure Restart after d/c as BP climbing back up           Consultants: neurology on phone Procedures performed: EEG  Disposition: Home Diet recommendation:  Carb modified diet  DISCHARGE MEDICATION: Allergies as of 08/01/2021   No Known Allergies      Medication List     STOP taking these medications    azelastine 0.1 % nasal spray Commonly known as: ASTELIN   ciclopirox 8 % solution Commonly known as: PENLAC   famotidine 20 MG tablet Commonly known as: Pepcid   gabapentin 300 MG capsule Commonly known as: NEURONTIN   levocetirizine 5 MG tablet Commonly known as: XYZAL   lidocaine 5 % Commonly known as: Lidoderm   magic mouthwash (nystatin, lidocaine, diphenhydrAMINE, alum & mag hydroxide) suspension   methocarbamol 500 MG tablet Commonly known as: ROBAXIN   omeprazole 40 MG capsule Commonly known as: PRILOSEC       TAKE these medications    albuterol 108 (90 Base) MCG/ACT inhaler Commonly known as: VENTOLIN HFA Inhale 2 puffs into the lungs every 6 (six) hours as needed for wheezing or shortness of breath.   aspirin EC 81 MG tablet Take 81 mg by mouth daily. Swallow whole.   atorvastatin 10 MG tablet Commonly known as: LIPITOR Take 1 tablet by mouth once daily   cholecalciferol 25 MCG (1000 UNIT) tablet Commonly known as: VITAMIN D3 Take 1,000 Units by mouth daily.   Insulin Pen Needle 32G X 4 MM Misc Commonly known as: ReliOn Pen Needles USE TO INJECT MEDICATION ONCE DAILY Dx 10.9   Jardiance 25 MG Tabs tablet Generic drug: empagliflozin TAKE 1 TABLET BY MOUTH BEFORE BREAKFAST   ondansetron 8 MG disintegrating tablet Commonly known as: Zofran ODT Take 1 tablet (8 mg total) by mouth every 8 (eight) hours as needed for nausea or vomiting.   PARoxetine 30 MG tablet Commonly known as: PAXIL Take 1 tablet by mouth once daily   Semglee (yfgn) 100 UNIT/ML Solostar Pen Generic  drug: insulin glargine Inject 70 Units into the skin daily.   SUMAtriptan 100 MG tablet Commonly known as: IMITREX TAKE 1 TABLET BY MOUTH EVERY 2 HOURS AS NEEDED FOR MIGRAINE HEADACHE   telmisartan-hydrochlorothiazide 40-12.5 MG tablet Commonly known as: MICARDIS HCT Take 0.5 tablets by mouth daily.   tirzepatide 7.5 MG/0.5ML Pen Commonly known as: MOUNJARO Inject 7.5 mg into the skin once a week. Start in February What changed: Another medication with the same name was removed. Continue taking this medication, and follow the directions you see here.   valACYclovir 1000 MG tablet Commonly known as: VALTREX Take 1,000 mg by mouth 2 (two) times daily.        Follow-up Information     Cameron Sprang, MD Follow up in 1 week(s).   Specialty: Neurology Contact information: [place] [address] [phone]                 Discharge Exam: Filed Weights   07/30/21 0500 07/31/21 0500 08/01/21 0538  Weight: 93.6 kg 92 kg 91.8 kg   HEENT:  [place]/AT, No thrush, no icterus CV:  RRR, no rub, no S3, no S4 Lung:  CTA, no wheeze, no rhonchi Abd:  soft/+BS, NT Ext:  No edema, no lymphangitis, no  synovitis, no rash Neuro:  CN II-XII intact, strength 4/5 in RUE, RLE, strength 4/5 LUE, LLE; sensation intact bilateral; no dysmetria; babinski equivocal   Condition at discharge: stable  The results of significant diagnostics from this hospitalization (including imaging, microbiology, ancillary and laboratory) are listed below for reference.   Imaging Studies: CT Head Wo Contrast  Result Date: 07/29/2021 CLINICAL DATA:  Patient found on floor by husband. Nausea and vomiting beginning this morning. Possible neck trauma. Confusion. EXAM: CT HEAD WITHOUT CONTRAST CT CERVICAL SPINE WITHOUT CONTRAST TECHNIQUE: Multidetector CT imaging of the head and cervical spine was performed following the standard protocol without intravenous contrast. Multiplanar CT image  reconstructions of the cervical spine were also generated. RADIATION DOSE REDUCTION: This exam was performed according to the departmental dose-optimization program which includes automated exposure control, adjustment of the mA and/or kV according to patient size and/or use of iterative reconstruction technique. COMPARISON:  None. FINDINGS: CT HEAD FINDINGS Brain: No evidence of acute infarction, hemorrhage, hydrocephalus, extra-axial collection or mass lesion/mass effect. Vascular: No hyperdense vessel or unexpected calcification. Skull: Normal. Negative for fracture or focal lesion. Sinuses/Orbits: No acute finding. Other: None. CT CERVICAL SPINE FINDINGS Alignment: Normal. Skull base and vertebrae: Vertebral body heights are maintained. There is mild spondylosis throughout the cervical spine. No acute fracture. Atlantoaxial articulation is unremarkable. Bilateral neural foraminal narrowing at the C6-7 level. Mild left-sided neural foraminal narrowing at the C3-4 level. Soft tissues and spinal canal: Prevertebral soft tissues are normal. Subtle canal stenosis at the C6-7 level. Disc levels: Minimal disc space narrowing at the C5-6 and C6-7 levels. Upper chest: No acute findings. Other: None. IMPRESSION: 1. No acute intracranial findings. 2. No acute cervical spine fracture or subluxation. 3. Mild spondylosis throughout the cervical spine with minimal disc disease at the C5-6 and C6-7 levels. Bilateral neural foraminal narrowing at the C6-7 level and mild left-sided neural foraminal narrowing at the C3-4 level. Subtle canal stenosis at the C6-7 level. Electronically Signed   By: Marin Olp M.D.   On: 07/29/2021 12:14   CT Cervical Spine Wo Contrast  Result Date: 07/29/2021 CLINICAL DATA:  Patient found on floor by husband. Nausea and vomiting beginning this morning. Possible neck trauma. Confusion. EXAM: CT HEAD WITHOUT CONTRAST CT CERVICAL SPINE WITHOUT CONTRAST TECHNIQUE: Multidetector CT imaging of the  head and cervical spine was performed following the standard protocol without intravenous contrast. Multiplanar CT image reconstructions of the cervical spine were also generated. RADIATION DOSE REDUCTION: This exam was performed according to the departmental dose-optimization program which includes automated exposure control, adjustment of the mA and/or kV according to patient size and/or use of iterative reconstruction technique. COMPARISON:  None. FINDINGS: CT HEAD FINDINGS Brain: No evidence of acute infarction, hemorrhage, hydrocephalus, extra-axial collection or mass lesion/mass effect. Vascular: No hyperdense vessel or unexpected calcification. Skull: Normal. Negative for fracture or focal lesion. Sinuses/Orbits: No acute finding. Other: None. CT CERVICAL SPINE FINDINGS Alignment: Normal. Skull base and vertebrae: Vertebral body heights are maintained. There is mild spondylosis throughout the cervical spine. No acute fracture. Atlantoaxial articulation is unremarkable. Bilateral neural foraminal narrowing at the C6-7 level. Mild left-sided neural foraminal narrowing at the C3-4 level. Soft tissues and spinal canal: Prevertebral soft tissues are normal. Subtle canal stenosis at the C6-7 level. Disc levels: Minimal disc space narrowing at the C5-6 and C6-7 levels. Upper chest: No acute findings. Other: None. IMPRESSION: 1. No acute intracranial findings. 2. No acute cervical spine fracture or subluxation. 3. Mild spondylosis throughout the  cervical spine with minimal disc disease at the C5-6 and C6-7 levels. Bilateral neural foraminal narrowing at the C6-7 level and mild left-sided neural foraminal narrowing at the C3-4 level. Subtle canal stenosis at the C6-7 level. Electronically Signed   By: Marin Olp M.D.   On: 07/29/2021 12:14   MR BRAIN WO CONTRAST  Result Date: 08/01/2021 CLINICAL DATA:  Seizure, new-onset, no history of trauma EXAM: MRI HEAD WITHOUT CONTRAST TECHNIQUE: Multiplanar, multiecho  pulse sequences of the brain and surrounding structures were obtained without intravenous contrast. COMPARISON:  None. FINDINGS: Brain: There is no acute infarction or intracranial hemorrhage. There is no intracranial mass, mass effect, or edema. There is no hydrocephalus or extra-axial fluid collection. Ventricles and sulci are normal in size and configuration. A few small foci of T2 hyperintensity are present in the supratentorial white matter likely reflecting minor nonspecific gliosis/demyelination. Vascular: Major vessel flow voids at the skull base are preserved. Skull and upper cervical spine: Normal marrow signal is preserved. Sinuses/Orbits: Paranasal sinuses are aerated. Orbits are unremarkable. Other: Sella is unremarkable.  Mastoid air cells are clear. IMPRESSION: No evidence of recent infarction, hemorrhage, or mass. Minor burden of nonspecific gliosis/demyelination in the cerebral white matter. Electronically Signed   By: Macy Mis M.D.   On: 08/01/2021 10:09   DG Chest Port 1 View  Result Date: 07/29/2021 CLINICAL DATA:  Fall, confusion EXAM: PORTABLE CHEST 1 VIEW COMPARISON:  None. FINDINGS: The heart size and mediastinal contours are within normal limits. Slightly increased interstitial markings within the left lung base. Lungs are otherwise clear. No pleural effusion or pneumothorax. The visualized skeletal structures are unremarkable. IMPRESSION: Slightly increased interstitial markings within the left lung base, which could represent atelectasis versus developing infiltrate. Otherwise, no acute cardiopulmonary findings. Electronically Signed   By: Davina Poke D.O.   On: 07/29/2021 11:03   MM 3D SCREEN BREAST BILATERAL  Result Date: 07/20/2021 CLINICAL DATA:  Screening. EXAM: DIGITAL SCREENING BILATERAL MAMMOGRAM WITH TOMOSYNTHESIS AND CAD TECHNIQUE: Bilateral screening digital craniocaudal and mediolateral oblique mammograms were obtained. Bilateral screening digital breast  tomosynthesis was performed. The images were evaluated with computer-aided detection. COMPARISON:  Previous exam(s). ACR Breast Density Category b: There are scattered areas of fibroglandular density. FINDINGS: There are no findings suspicious for malignancy. IMPRESSION: No mammographic evidence of malignancy. A result letter of this screening mammogram will be mailed directly to the patient. RECOMMENDATION: Screening mammogram in one year. (Code:SM-B-01Y) BI-RADS CATEGORY  1: Negative. Electronically Signed   By: Everlean Alstrom M.D.   On: 07/20/2021 09:08   ECHOCARDIOGRAM COMPLETE  Result Date: 07/30/2021    ECHOCARDIOGRAM REPORT   Patient Name:   CELINE DISHMAN Date of Exam: 07/30/2021 Medical Rec #:  [id number]     Height:       64.0 in Accession #:    [id number]    Weight:       206.3 lb Date of Birth:  04-16-1961      BSA:          1.983 m Patient Age:    20 years      BP:           97/48 mmHg Patient Gender: F             HR:           78 bpm. Exam Location:  [facility] Procedure: 2D Echo, Cardiac Doppler and Color Doppler Indications:    Syncope  History:  Patient has no prior history of Echocardiogram examinations.  Sonographer:    Merrie Roof RDCS Referring Phys: [place]  1. Left ventricular ejection fraction, by estimation, is 60 to 65%. The left ventricle has normal function. The left ventricle has no regional wall motion abnormalities. There is mild concentric left ventricular hypertrophy. Left ventricular diastolic parameters were normal.  2. Right ventricular systolic function is normal. The right ventricular size is normal. There is normal pulmonary artery systolic pressure.  3. The mitral valve is normal in structure. No evidence of mitral valve regurgitation. No evidence of mitral stenosis.  4. The aortic valve is normal in structure. Aortic valve regurgitation is not visualized. No aortic stenosis is present.  5. The inferior vena cava is normal in size with greater  than 50% respiratory variability, suggesting right atrial pressure of 3 mmHg. FINDINGS  Left Ventricle: Left ventricular ejection fraction, by estimation, is 60 to 65%. The left ventricle has normal function. The left ventricle has no regional wall motion abnormalities. The left ventricular internal cavity size was normal in size. There is  mild concentric left ventricular hypertrophy. Left ventricular diastolic parameters were normal. Normal left ventricular filling pressure. Right Ventricle: The right ventricular size is normal. No increase in right ventricular wall thickness. Right ventricular systolic function is normal. There is normal pulmonary artery systolic pressure. The tricuspid regurgitant velocity is 2.54 m/s, and  with an assumed right atrial pressure of 3 mmHg, the estimated right ventricular systolic pressure is 38.1 mmHg. Left Atrium: Left atrial size was normal in size. Right Atrium: Right atrial size was normal in size. Pericardium: There is no evidence of pericardial effusion. Mitral Valve: The mitral valve is normal in structure. No evidence of mitral valve regurgitation. No evidence of mitral valve stenosis. Tricuspid Valve: The tricuspid valve is normal in structure. Tricuspid valve regurgitation is trivial. No evidence of tricuspid stenosis. Aortic Valve: The aortic valve is normal in structure. Aortic valve regurgitation is not visualized. No aortic stenosis is present. Aortic valve mean gradient measures 4.0 mmHg. Aortic valve peak gradient measures 6.8 mmHg. Aortic valve area, by VTI measures 2.35 cm. Pulmonic Valve: The pulmonic valve was normal in structure. Pulmonic valve regurgitation is not visualized. No evidence of pulmonic stenosis. Aorta: The aortic root is normal in size and structure. Venous: The inferior vena cava is normal in size with greater than 50% respiratory variability, suggesting right atrial pressure of 3 mmHg. IAS/Shunts: No atrial level shunt detected by color flow  Doppler.  LEFT VENTRICLE PLAX 2D LVIDd:         3.70 cm   Diastology LV PW:         1.10 cm   LV e' medial:    10.30 cm/s LV IVS:        1.20 cm   LV E/e' medial:  11.0 LVOT diam:     2.00 cm   LV e' lateral:   10.20 cm/s LV SV:         63        LV E/e' lateral: 11.1 LV SV Index:   32 LVOT Area:     3.14 cm  RIGHT VENTRICLE RV Basal diam:  2.80 cm LEFT ATRIUM             Index        RIGHT ATRIUM           Index LA diam:        3.90 cm 1.97  cm/m   RA Area:     13.10 cm LA Vol (A2C):   33.8 ml 17.05 ml/m  RA Volume:   31.40 ml  15.84 ml/m LA Vol (A4C):   59.3 ml 29.91 ml/m LA Biplane Vol: 47.2 ml 23.81 ml/m  AORTIC VALVE AV Area (Vmax):    2.34 cm AV Area (Vmean):   2.35 cm AV Area (VTI):     2.35 cm AV Vmax:           130.00 cm/s AV Vmean:          88.400 cm/s AV VTI:            0.270 m AV Peak Grad:      6.8 mmHg AV Mean Grad:      4.0 mmHg LVOT Vmax:         97.00 cm/s LVOT Vmean:        66.000 cm/s LVOT VTI:          0.202 m LVOT/AV VTI ratio: 0.75  AORTA Ao Root diam: 3.00 cm Ao Asc diam:  3.00 cm MITRAL VALVE                TRICUSPID VALVE MV Area (PHT): 4.21 cm     TR Peak grad:   25.8 mmHg MV Decel Time: 180 msec     TR Vmax:        254.00 cm/s MV E velocity: 113.00 cm/s MV A velocity: 88.80 cm/s   SHUNTS MV E/A ratio:  1.27         Systemic VTI:  0.20 m                             Systemic Diam: 2.00 cm Fransico Him MD Electronically signed by Fransico Him MD Signature Date/Time: 07/30/2021/11:49:24 AM    Final     Microbiology: Results for orders placed or performed during the hospital encounter of 07/29/21  Resp Panel by RT-PCR (Flu A&B, Covid) Nasopharyngeal Swab     Status: None   Collection Time: 07/29/21 10:38 AM   Specimen: Nasopharyngeal Swab; Nasopharyngeal(NP) swabs in vial transport medium  Result Value Ref Range Status   SARS Coronavirus 2 by RT PCR NEGATIVE NEGATIVE Final    Comment: (NOTE) SARS-CoV-2 target nucleic acids are NOT DETECTED.  The SARS-CoV-2 RNA is generally  detectable in upper respiratory specimens during the acute phase of infection. The lowest concentration of SARS-CoV-2 viral copies this assay can detect is 138 copies/mL. A negative result does not preclude SARS-Cov-2 infection and should not be used as the sole basis for treatment or other patient management decisions. A negative result may occur with  improper specimen collection/handling, submission of specimen other than nasopharyngeal swab, presence of viral mutation(s) within the areas targeted by this assay, and inadequate number of viral copies(<138 copies/mL). A negative result must be combined with clinical observations, patient history, and epidemiological information. The expected result is Negative.  Fact Sheet for Patients:  EntrepreneurPulse.com.au  Fact Sheet for Healthcare Providers:  IncredibleEmployment.be  This test is no t yet approved or cleared by the [place] FDA and  has been authorized for detection and/or diagnosis of SARS-CoV-2 by FDA under an Emergency Use Authorization (EUA). This EUA will remain  in effect (meaning this test can be used) for the duration of the COVID-19 declaration under Section 564(b)(1) of the Act, 21 U.S.C.section 360bbb-3(b)(1), unless the authorization is terminated  or revoked sooner.  Influenza A by PCR NEGATIVE NEGATIVE Final   Influenza B by PCR NEGATIVE NEGATIVE Final    Comment: (NOTE) The Xpert Xpress SARS-CoV-2/FLU/RSV plus assay is intended as an aid in the diagnosis of influenza from Nasopharyngeal swab specimens and should not be used as a sole basis for treatment. Nasal washings and aspirates are unacceptable for Xpert Xpress SARS-CoV-2/FLU/RSV testing.  Fact Sheet for Patients: EntrepreneurPulse.com.au  Fact Sheet for Healthcare Providers: IncredibleEmployment.be  This test is not yet approved or cleared by the [place] FDA  and has been authorized for detection and/or diagnosis of SARS-CoV-2 by FDA under an Emergency Use Authorization (EUA). This EUA will remain in effect (meaning this test can be used) for the duration of the COVID-19 declaration under Section 564(b)(1) of the Act, 21 U.S.C. section 360bbb-3(b)(1), unless the authorization is terminated or revoked.  Performed at [facility], [address], [place], [place] [address]     Labs: CBC: Recent Labs  Lab 07/29/21 1111 07/30/21 0442 07/31/21 0520  WBC 24.8* 11.4* 10.3  NEUTROABS 22.1*  --   --   HGB 14.0 11.8* 12.2  HCT 42.6 37.3 36.9  MCV 94.7 96.6 98.1  PLT 395 333 235   Basic Metabolic Panel: Recent Labs  Lab 07/29/21 1111 07/30/21 0442 07/31/21 0520 08/01/21 0523  NA 140 142 141 140  K 3.5 3.1* 4.1 3.9  CL 106 111 109 107  CO2 25 26 27 26   GLUCOSE 110* 58* 118* 124*  BUN 15 11 8 8   CREATININE 0.70 0.75 0.72 0.68  CALCIUM 8.7* 8.0* 8.3* 8.6*  MG 2.3  --  1.8 1.9   Liver Function Tests: Recent Labs  Lab 07/29/21 1111  AST 26  ALT 24  ALKPHOS 69  BILITOT 1.2  PROT 7.3  ALBUMIN 4.1   CBG: Recent Labs  Lab 07/31/21 2051 08/01/21 0634 08/01/21 0717 08/01/21 1109 08/01/21 1604  GLUCAP 115* 130* 119* 129* 117*    Discharge time spent: greater than 30 minutes.  Signed: Orson Eva, MD [facility] 08/01/2021

## 2021-08-01 NOTE — Progress Notes (Signed)
EEG complete - results pending 

## 2021-08-01 NOTE — Procedures (Signed)
Patient Name: Carrie [name]  MRN: [id number]  Epilepsy Attending: Lora Havens  Referring Physician/Provider: Orson Eva, MD Date: 08/01/2021 Duration: 21.39 mins  Patient history: 89AN f with syncope. EEG to evaluate for seizure  Level of alertness: Awake  AEDs during EEG study: None  Technical aspects: This EEG study was done with scalp electrodes positioned according to the 10-20 International system of electrode placement. Electrical activity was acquired at a sampling rate of 500Hz  and reviewed with a high frequency filter of 70Hz  and a low frequency filter of 1Hz . EEG data were recorded continuously and digitally stored.   Description: The posterior dominant rhythm consists of 9-10 Hz activity of moderate voltage (25-35 uV) seen predominantly in posterior head regions, symmetric and reactive to eye opening and eye closing. Hyperventilation and photic stimulation were not performed.     IMPRESSION: This study is within normal limits. No seizures or epileptiform discharges were seen throughout the recording.  [name] Barbra Sarks

## 2021-08-01 NOTE — Progress Notes (Signed)
Discharge instructions given pt verbalized understanding. Discharged via wheelchair to private vehicle.

## 2021-08-02 LAB — FOLATE RBC
Folate, Hemolysate: 430 ng/mL
Folate, RBC: 1201 ng/mL (ref >498)
Hematocrit: 35.8 % (ref 34.0–46.6)

## 2021-08-03 ENCOUNTER — Encounter: Payer: Self-pay | Admitting: Family Medicine

## 2021-08-03 ENCOUNTER — Ambulatory Visit: Payer: BC Managed Care – PPO | Admitting: Family Medicine

## 2021-08-03 VITALS — BP 110/77 | HR 93 | Temp 97.8°F | Ht 64.0 in | Wt 196.2 lb

## 2021-08-03 DIAGNOSIS — Z09 Encounter for follow-up examination after completed treatment for conditions other than malignant neoplasm: Secondary | ICD-10-CM

## 2021-08-03 DIAGNOSIS — R569 Unspecified convulsions: Secondary | ICD-10-CM

## 2021-08-03 MED ORDER — RELION PEN NEEDLES 32G X 4 MM MISC: 3 refills | Status: DC

## 2021-08-03 NOTE — Progress Notes (Signed)
? ?Subjective: ?[id number] dc ?PCP: [name], DO ?VZD:GLOV BELLAROSE BURTT is a 61 y.o. female presenting to clinic today for: ? ?1.  Hospital discharge follow-up for seizure-like activity ?Patient was hospitalized after having what sounds like seizure-like activity.  She apparently had total loss of bowel and bladder continence as well as bit her tongue.  She does not recall any time during the event but may have been down for about 30 minutes.  She had been ill with some type of GI issue the day prior.  She was hydrated in the hospital.  Head scan of the brain which demonstrated no significant findings except for some mild nonspecific changes suggestive of demyelination in the white matter.  She has a follow-up visit with neurology tomorrow, Dr. Delice Lesch, to discuss issues further.  EEG was nonrevealing.  She is not currently being treated with any antiseizure medication but there is talk of potentially doing a continuous EEG to further evaluate.  She has questions about when she can go back to work and or drive. ? ? ?ROS: Per HPI ? ?No Known Allergies ?Past Medical History:  ?Diagnosis Date  ? Depression   ? Diabetes mellitus without complication ([place])   ? Fever blister   ? Gastroparesis   ? Hyperlipidemia   ? Hypertension   ? Migraine   ? ? ?Current Outpatient Medications:  ?  aspirin EC 81 MG tablet, Take 81 mg by mouth daily. Swallow whole., Disp: , Rfl:  ?  atorvastatin (LIPITOR) 10 MG tablet, Take 1 tablet by mouth once daily, Disp: 90 tablet, Rfl: 0 ?  cholecalciferol (VITAMIN D3) 25 MCG (1000 UNIT) tablet, Take 1,000 Units by mouth daily., Disp: , Rfl:  ?  Insulin Pen Needle (RELION PEN NEEDLES) 32G X 4 MM MISC, USE TO INJECT MEDICATION ONCE DAILY Dx 10.9, Disp: 100 each, Rfl: 3 ?  JARDIANCE 25 MG TABS tablet, TAKE 1 TABLET BY MOUTH BEFORE BREAKFAST, Disp: 90 tablet, Rfl: 0 ?  ondansetron (ZOFRAN ODT) 8 MG disintegrating tablet, Take 1 tablet (8 mg total) by mouth every 8 (eight) hours as needed for  nausea or vomiting., Disp: 20 tablet, Rfl: 0 ?  PARoxetine (PAXIL) 30 MG tablet, Take 1 tablet by mouth once daily, Disp: 90 tablet, Rfl: 3 ?  SUMAtriptan (IMITREX) 100 MG tablet, TAKE 1 TABLET BY MOUTH EVERY 2 HOURS AS NEEDED FOR MIGRAINE HEADACHE, Disp: 10 tablet, Rfl: 3 ?  telmisartan-hydrochlorothiazide (MICARDIS HCT) 40-12.5 MG tablet, Take 0.5 tablets by mouth daily., Disp: 45 tablet, Rfl: 1 ?  tirzepatide (MOUNJARO) 7.5 MG/0.5ML Pen, Inject 7.5 mg into the skin once a week. Start in February, Disp: 2 mL, Rfl: 0 ?  valACYclovir (VALTREX) 1000 MG tablet, Take 1,000 mg by mouth 2 (two) times daily., Disp: , Rfl:  ?  albuterol (VENTOLIN HFA) 108 (90 Base) MCG/ACT inhaler, Inhale 2 puffs into the lungs every 6 (six) hours as needed for wheezing or shortness of breath. (Patient not taking: Reported on 08/03/2021), Disp: 8 g, Rfl: 0 ?  insulin glargine (SEMGLEE) 100 UNIT/ML Solostar Pen, Inject 70 Units into the skin daily., Disp: 60 mL, Rfl: 3 ?Social History  ? ?Socioeconomic History  ? Marital status: Married  ?  Spouse name: Not on file  ? Number of children: 2  ? Years of education: Not on file  ? Highest education level: Not on file  ?Occupational History  ? Occupation: customer service  ?Tobacco Use  ? Smoking status: Former  ?  Packs/day: 0.50  ?  Years: 40.00  ?  Pack years: 20.00  ?  Types: Cigarettes  ?  Quit date: 2006  ?  Years since quitting: 17.1  ? Smokeless tobacco: Never  ?Vaping Use  ? Vaping Use: Never used  ?Substance and Sexual Activity  ? Alcohol use: Not Currently  ?  Comment: occasional   ? Drug use: No  ? Sexual activity: Not on file  ?Other Topics Concern  ? Not on file  ?Social History Narrative  ? .  ?   ? ?Social Determinants of Health  ? ?Financial Resource Strain: Not on file  ?Food Insecurity: Not on file  ?Transportation Needs: Not on file  ?Physical Activity: Not on file  ?Stress: Not on file  ?Social Connections: Not on file  ?Intimate Partner Violence: Not on file  ? ?Family  History  ?Problem Relation Age of Onset  ? Ovarian cancer Mother   ? Mental illness Daughter   ? Migraines Daughter   ? Colon cancer Neg Hx   ? Rectal cancer Neg Hx   ? Throat cancer Neg Hx   ? ? ?Objective: ?Office vital signs reviewed. ?BP 110/77   Pulse 93   Temp 97.8 ?F (36.6 ?C)   Ht 5\' 4"  (1.626 m)   Wt 196 lb 3.2 oz (89 kg)   SpO2 96%   BMI 33.68 kg/m?  ? ?Physical Examination:  ?General: Awake, alert, well nourished, nontoxic-appearing female, No acute distress ?HEENT: Sclera white. ?Cardio: regular rate and rhythm ?Pulm: normal work of breathing on room air ?Neuro: No focal neurologic deficits. ?Psych: Mood is stable but she is intermittently tearful when recounting the events ? ?Assessment/ Plan: ?61 y.o. female  ? ?Seizure-like activity ([place]) ? ?Hospital discharge follow-up ? ?I am eagerly awaiting what neurology's assessment will be.  I am also having our clinical pharmacist investigate if there is any connection to seizure-like activity and Mounjaro, which we started in December.  Thus far have not been able to find anything in the literature but she is actually going to reach out to the manufacturer about this. ? ?I am asking the patient to hold her blood pressure medicine since she is on the low side of normal.  I do question if potentially hypotension had precipitated events.  She is currently taking her blood pressure medicine every other day but knows that she should hold blood pressure medicine for now.  Would like her to monitor blood pressures daily and goal blood pressure is to remain below 140/90.  Assuming that her blood pressure remains below goal she does not need to resume her BP meds.  She will for now continue all medications otherwise as directed. ? ? ?No orders of the defined types were placed in this encounter. ? ?No orders of the defined types were placed in this encounter. ? ? ? ?[name], DO ?[place] ?[phone] ? ?

## 2021-08-04 ENCOUNTER — Ambulatory Visit: Payer: BC Managed Care – PPO | Admitting: Neurology

## 2021-08-04 ENCOUNTER — Other Ambulatory Visit: Payer: Self-pay | Admitting: Family Medicine

## 2021-08-04 ENCOUNTER — Other Ambulatory Visit: Payer: Self-pay

## 2021-08-04 ENCOUNTER — Ambulatory Visit: Payer: BC Managed Care – PPO | Admitting: Family Medicine

## 2021-08-04 ENCOUNTER — Encounter: Payer: Self-pay | Admitting: Neurology

## 2021-08-04 VITALS — BP 129/77 | HR 85 | Ht 66.0 in | Wt 196.0 lb

## 2021-08-04 DIAGNOSIS — R55 Syncope and collapse: Secondary | ICD-10-CM | POA: Diagnosis not present

## 2021-08-04 DIAGNOSIS — E1169 Type 2 diabetes mellitus with other specified complication: Secondary | ICD-10-CM

## 2021-08-04 MED ORDER — TIRZEPATIDE 10 MG/0.5ML ~~LOC~~ SOAJ: 10.0000 mg | SUBCUTANEOUS | 0 refills | Status: DC
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
## 2021-08-04 NOTE — Patient Instructions (Signed)
Good to meet you! ? ?Schedule 24-hour EEG ? ?2. Follow-up after EEG, call for any changes ? ? ?Seizure Precautions: ?1. If medication has been prescribed for you to prevent seizures, take it exactly as directed.  Do not stop taking the medicine without talking to your doctor first, even if you have not had a seizure in a long time.  ? ?2. Avoid activities in which a seizure would cause danger to yourself or to others.  Don't operate dangerous machinery, swim alone, or climb in high or dangerous places, such as on ladders, roofs, or girders.  Do not drive unless your doctor says you may. ? ?3. If you have any warning that you may have a seizure, lay down in a safe place where you can't hurt yourself.   ? ?4.  No driving for 6 months from last seizure, as per [facility].   Please refer to the following link on the [facility] website for more information: http://[email or web address]/[email or web address]/drivingu.cfm  ? ?5.  Maintain good sleep hygiene.  ? ?6.  Contact your doctor if you have any problems that may be related to the medicine you are taking. ? ?7.  Call 911 and bring the patient back to the ED if: ?      ? A.  The seizure lasts longer than 5 minutes.      ? B.  The patient doesn't awaken shortly after the seizure ? C.  The patient has new problems such as difficulty seeing, speaking or moving ? D.  The patient was injured during the seizure ? E.  The patient has a temperature over 102 F (39C) ? F.  The patient vomited and now is having trouble breathing ?      ? ?

## 2021-08-07 ENCOUNTER — Other Ambulatory Visit: Payer: Self-pay | Admitting: Family Medicine

## 2021-08-07 DIAGNOSIS — E1159 Type 2 diabetes mellitus with other circulatory complications: Secondary | ICD-10-CM

## 2021-08-07 DIAGNOSIS — E1169 Type 2 diabetes mellitus with other specified complication: Secondary | ICD-10-CM

## 2021-08-19 ENCOUNTER — Other Ambulatory Visit: Payer: Self-pay | Admitting: Family Medicine

## 2021-08-23 ENCOUNTER — Encounter: Payer: Self-pay | Admitting: Family Medicine

## 2021-08-26 ENCOUNTER — Ambulatory Visit (INDEPENDENT_AMBULATORY_CARE_PROVIDER_SITE_OTHER): Payer: BC Managed Care – PPO | Admitting: Family Medicine

## 2021-08-26 DIAGNOSIS — E1169 Type 2 diabetes mellitus with other specified complication: Secondary | ICD-10-CM

## 2021-08-26 MED ORDER — SEMAGLUTIDE (1 MG/DOSE) 4 MG/3ML ~~LOC~~ SOPN: 1.0000 mg | PEN_INJECTOR | SUBCUTANEOUS | 4 refills | Status: DC

## 2021-08-26 MED ORDER — ONDANSETRON 4 MG PO TBDP: 4.0000 mg | ORAL_TABLET | Freq: Three times a day (TID) | ORAL | 0 refills | Status: DC | PRN

## 2021-08-26 NOTE — Progress Notes (Signed)
Telephone visit ? ?Subjective: ?CC: DM ?PCP: [name], DO ?GXQ:JJHE LISMARY KIEHN is a 61 y.o. female calls for telephone consult today. Patient provides verbal consent for consult held via phone. ? ?Due to COVID-19 pandemic this visit was conducted virtually. This visit type was conducted due to national recommendations for restrictions regarding the COVID-19 Pandemic (e.g. social distancing, sheltering in place) in an effort to limit this patient's exposure and mitigate transmission in our community. All issues noted in this document were discussed and addressed.  A physical exam was not performed with this format.  ? ?Location of patient: home ?Location of provider: [facility] ?Others present for call: none ? ?1. DM ?Yaiza reports that BGs have been variable.  She reports she had a low BG 60s.  On average 120-130s.  She is compliant with Mounjaro but is interested in transitioning off of this since her weight loss has not been as pronounced that she is hoping and she is really starting to experience more nausea related to the medication than it is benefiting her from a weight loss standpoint.  In fact, nausea and abdominal discomfort became so severe that she has had to miss work for this.  She would like to get back to Ozempic which she was tolerating with no difficulty ? ?ROS: Per HPI ? ?No Known Allergies ?Past Medical History:  ?Diagnosis Date  ? Depression   ? Diabetes mellitus without complication ([place])   ? Fever blister   ? Gastroparesis   ? Hyperlipidemia   ? Hypertension   ? Migraine   ? ? ?Current Outpatient Medications:  ?  aspirin EC 81 MG tablet, Take 81 mg by mouth daily. Swallow whole., Disp: , Rfl:  ?  atorvastatin (LIPITOR) 10 MG tablet, Take 1 tablet by mouth once daily, Disp: 90 tablet, Rfl: 0 ?  cholecalciferol (VITAMIN D3) 25 MCG (1000 UNIT) tablet, Take 1,000 Units by mouth daily., Disp: , Rfl:  ?  insulin glargine (SEMGLEE) 100 UNIT/ML Solostar Pen, Inject 70 Units into the skin daily.,  Disp: 60 mL, Rfl: 3 ?  Insulin Pen Needle (RELION PEN NEEDLES) 32G X 4 MM MISC, USE TO INJECT MEDICATION ONCE DAILY Dx 10.9, Disp: 100 each, Rfl: 3 ?  JARDIANCE 25 MG TABS tablet, TAKE 1 TABLET BY MOUTH BEFORE BREAKFAST, Disp: 90 tablet, Rfl: 0 ?  ondansetron (ZOFRAN ODT) 8 MG disintegrating tablet, Take 1 tablet (8 mg total) by mouth every 8 (eight) hours as needed for nausea or vomiting., Disp: 20 tablet, Rfl: 0 ?  PARoxetine (PAXIL) 30 MG tablet, Take 1 tablet by mouth once daily, Disp: 90 tablet, Rfl: 3 ?  SEMGLEE, YFGN, 100 UNIT/ML Pen, INJECT 0.7 MLS (70 UNITS TOTAL) SUBCUTANEOUSLY ONCE DAILY, Disp: 60 mL, Rfl: 0 ?  SUMAtriptan (IMITREX) 100 MG tablet, TAKE 1 TABLET BY MOUTH EVERY 2 HOURS AS NEEDED FOR MIGRAINE HEADACHE, Disp: 10 tablet, Rfl: 3 ?  telmisartan-hydrochlorothiazide (MICARDIS HCT) 40-12.5 MG tablet, Take 1/2 (one-half) tablet by mouth once daily, Disp: 45 tablet, Rfl: 0 ?  tirzepatide (MOUNJARO) 10 MG/0.5ML Pen, Inject 10 mg into the skin once a week., Disp: 6 mL, Rfl: 0 ?  valACYclovir (VALTREX) 1000 MG tablet, Take 1,000 mg by mouth 2 (two) times daily., Disp: , Rfl:  ? ?Assessment/ Plan: ?61 y.o. female  ? ?Type 2 diabetes mellitus with other specified complication, without long-term current use of insulin (HCC) - Plan: Semaglutide, 1 MG/DOSE, 4 MG/3ML SOPN ? ?Discontinue Mounjaro.  Sample of Ozempic 0.5 mg provided.  She may use that for the next 2 weeks and then advance to the 1 mg dose which was working well for her prior to transition over to [employer].  She will follow-up as needed or at her next diabetic check ? ?Start time: 9:19am ?End time: 9:26am ? ?Total time spent on patient care (including telephone call/ virtual visit): 7 minutes ? ?[name], DO ?[place] ?[phone] ? ?

## 2021-09-01 ENCOUNTER — Ambulatory Visit: Payer: BC Managed Care – PPO | Admitting: Neurology

## 2021-09-01 ENCOUNTER — Other Ambulatory Visit: Payer: BC Managed Care – PPO

## 2021-09-01 DIAGNOSIS — R55 Syncope and collapse: Secondary | ICD-10-CM | POA: Diagnosis not present

## 2021-09-12 NOTE — Procedures (Signed)
ELECTROENCEPHALOGRAM REPORT ? ?Dates of Recording: 09/01/2021 10:46 AM to 09/02/2021 10:48 AM ? ?Patient's Name: Carrie [name] ?MRN: [id number] ?Date of Birth: Aug 29, 1960 ? ?Referring Provider: Dr. Ellouise Newer ? ?Procedure: 24-hour ambulatory video EEG ? ?History:  This is a 61 year old woman with unwitnessed episode of loss of consciousness on 07/29/2021. EEG for classification ? ?Medications:  ?aspirin EC 81 MG tablet ?LIPITOR 10 MG tablet ?VITAMIN D3 25 MCG (1000 UNIT) tablet ?SEMGLEE 100 UNIT/ML Solostar Pen ?JARDIANCE 25 MG TABS tablet ?ZOFRAN ODT 8 MG disintegrating tablet ?PAXIL 30 MG tablet ?IMITREX 100 MG tablet ?MICARDIS HCT 40-12.5 MG tablet ?MOUNJARO 7.5 MG/0.5ML Pen ?VALTREX 1000 MG tablet ? ?Technical Summary: ?This is a 24-hour multichannel digital video EEG recording measured by the international 10-20 system with electrodes applied with paste and impedances below 5000 ohms performed as portable with EKG monitoring.  The digital EEG was referentially recorded, reformatted, and digitally filtered in a variety of bipolar and referential montages for optimal display.   ? ?DESCRIPTION OF RECORDING: ?During maximal wakefulness, the background activity consisted of a symmetric 10 Hz posterior dominant rhythm which was reactive to eye opening.  There were no epileptiform discharges or focal slowing seen in wakefulness. ? ?During the recording, the patient progresses through wakefulness, drowsiness, and Stage 2 sleep.  Again, there were no epileptiform discharges seen. ? ?Events: ?There were no push button events. ? ?There were no electrographic seizures seen.  EKG lead was unremarkable. ? ?IMPRESSION: This 24-hour ambulatory video EEG study is normal.   ? ?CLINICAL CORRELATION: A normal EEG does not exclude a clinical diagnosis of epilepsy. Typical events were not captured. If further clinical questions remain, inpatient video EEG monitoring may be helpful. ? ? ?Ellouise Newer, M.D. ? ?

## 2021-09-16 ENCOUNTER — Telehealth: Payer: Self-pay | Admitting: Neurology

## 2021-09-16 NOTE — Telephone Encounter (Signed)
Patient called, wants her results of her EEG. Said she has not heard back from anyone. ?The number we have for her work is [phone], she told me [phone]. She wants to be called at her job. ? ?

## 2021-09-16 NOTE — Telephone Encounter (Signed)
I was able to see in the procedure visit that patients EEG was normal called patient and let her know Dr. Delice Lesch is out of town until Tuesday so I do not know any further steps of care at this time. Patient appreciative to get results and will wait to her from Dr. Delice Lesch when she returns  ?

## 2021-09-21 ENCOUNTER — Encounter: Payer: Self-pay | Admitting: Neurology

## 2021-09-21 ENCOUNTER — Ambulatory Visit: Payer: BC Managed Care – PPO | Admitting: Neurology

## 2021-09-21 VITALS — BP 131/86 | HR 73 | Ht 66.0 in | Wt 194.0 lb

## 2021-09-21 DIAGNOSIS — R55 Syncope and collapse: Secondary | ICD-10-CM | POA: Diagnosis not present
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
## 2021-09-21 NOTE — Patient Instructions (Signed)
Good to see you doing well. Follow-up as needed, call for any changes. ?

## 2021-09-30 ENCOUNTER — Ambulatory Visit: Payer: BC Managed Care – PPO | Admitting: Gastroenterology

## 2021-09-30 ENCOUNTER — Encounter: Payer: Self-pay | Admitting: Gastroenterology

## 2021-09-30 VITALS — BP 140/100 | HR 81 | Ht 64.0 in | Wt 197.2 lb

## 2021-09-30 DIAGNOSIS — K3184 Gastroparesis: Secondary | ICD-10-CM | POA: Diagnosis not present

## 2021-09-30 DIAGNOSIS — Z1211 Encounter for screening for malignant neoplasm of colon: Secondary | ICD-10-CM | POA: Diagnosis not present

## 2021-09-30 DIAGNOSIS — Z6833 Body mass index (BMI) 33.0-33.9, adult: Secondary | ICD-10-CM | POA: Diagnosis not present

## 2021-09-30 DIAGNOSIS — E669 Obesity, unspecified: Secondary | ICD-10-CM | POA: Diagnosis not present

## 2021-09-30 MED ORDER — PLENVU 140 G PO SOLR: 1.0000 | ORAL | 0 refills | Status: DC

## 2021-09-30 MED ORDER — METOCLOPRAMIDE HCL 5 MG PO TABS
ORAL_TABLET | ORAL | 0 refills | Status: DC
Start: 2021-09-30 — End: 2021-11-10

## 2021-09-30 NOTE — Patient Instructions (Addendum)
We have sent the following medications to your pharmacy for you to pick up at your convenience: ?Reglan 5 mg three times daily before meals ? ?We will refer you to Dr Johnette Abraham at [facility]. If you have not heard from their office regarding an appointment within the next 2 weeks, please call us at [phone]. ? ?You have been scheduled for a colonoscopy. Please follow written instructions given to you at your visit today.  ?Please pick up your prep supplies at the pharmacy within the next 1-3 days. ?If you use inhalers (even only as needed), please bring them with you on the day of your procedure. ? ?If you are age 18 or older, your body mass index should be between 23-30. Your Body mass index is 33.86 kg/m?Marland Kitchen If this is out of the aforementioned range listed, please consider follow up with your Primary Care Provider. ? ?If you are age 10 or younger, your body mass index should be between 19-25. Your Body mass index is 33.86 kg/m?Marland Kitchen If this is out of the aformentioned range listed, please consider follow up with your Primary Care Provider.  ? ?________________________________________________________ ? ?The [facility] providers would like to encourage you to use [facility] to communicate with providers for non-urgent requests or questions.  Due to long hold times on the telephone, sending your provider a message by [facility] may be a faster and more efficient way to get a response.  Please allow 48 business hours for a response.  Please remember that this is for non-urgent requests.  ?_______________________________________________________ ? ?Due to recent changes in healthcare laws, you may see the results of your imaging and laboratory studies on MyChart before your provider has had a chance to review them.  We understand that in some cases there may be results that are confusing or concerning to you. Not all laboratory results come back in the same time frame and the provider may be waiting for  multiple results in order to interpret others.  Please give [place] 48 hours in order for your provider to thoroughly review all the results before contacting the office for clarification of your results.  ? ?

## 2021-09-30 NOTE — Progress Notes (Signed)
? ?HPI :  ?61 year old female history of diabetes, gastroparesis, obesity, altered bowel habits, here to reestablish care for some of these issues, I last saw her in August 2018. ? ?Recall she has a history of gastroparesis that was confirmed with a gastric emptying study in June 2018.  She initially had an EGD in June 2018 which was concerning for retained food.  Her stomach seemed normal but full evaluation of possible.  I had recommended Reglan at the time for her symptoms but she is not sure if she actually took it, have not seen her since that time. ? ?She states her diabetes is doing better but she has been on and off regimens to include Ozempic and Mounjaro.  More recently she is back on Ozempic for the past 2 weeks and thinks she tolerates it better than Mounjaro.  She does have nausea frequently that bothers her.  She has a vomiting episode sporadically.  She thinks her symptoms have worsened in recent months.  She uses Tums as needed and Maalox as needed which do not really help too much.  She denies any pyrosis or reflux symptoms otherwise.  She has been on Zofran and she is not sure if that is helped in the past, she does not really take it regularly.  Her A1c is most recently 6.9. ? ?She had a very difficult time losing weight, her body mass index is about 34.  She reports frustrations and difficulty losing weight for her diabetes. ? ?She last had a colonoscopy in May 2013 which was normal without polyps, notable for internal hemorrhoids.  She previously had some loose stools but more recently she goes about once per day without blood.  Her bowels are now bothering her too much right now.  No family history of colon cancer. ? ?She denies any cardiopulmonary symptoms at this time.  She had a syncopal episode in February that was worked up by neurology, ultimately thought to been due to dehydration.  She had echocardiogram, MRI of her brain, EEG etc.  She otherwise has been feeling well ? ? ?Prior  work-up: ?Colonoscopy 10/2011 - no polyps, hemorrhoids ?  ?EGD 11/06/2016 - normal esophagus, retained food in the stomach concerning for gastroparesis, small bowel not evaluated due to retained food. Biopsies taken for H pylori and negative ?  ?Gastric emptying study - 11/21/16 - mildy delayed emptying ? ?Echo 07/30/21: ?EF 60-65% ? ? ?Past Medical History:  ?Diagnosis Date  ? Depression   ? Diabetes mellitus without complication ([place])   ? Fever blister   ? Gastroparesis   ? Hyperlipidemia   ? Hypertension   ? Migraine   ? ? ? ?Past Surgical History:  ?Procedure Laterality Date  ? BACK SURGERY    ? CHOLECYSTECTOMY  2004  ? ?Family History  ?Problem Relation Age of Onset  ? Ovarian cancer Mother   ? Mental illness Daughter   ? Migraines Daughter   ? Colon cancer Neg Hx   ? Rectal cancer Neg Hx   ? Throat cancer Neg Hx   ? ?Social History  ? ?Tobacco Use  ? Smoking status: Former  ?  Packs/day: 0.50  ?  Years: 40.00  ?  Pack years: 20.00  ?  Types: Cigarettes  ?  Quit date: 2006  ?  Years since quitting: 17.3  ? Smokeless tobacco: Never  ?Vaping Use  ? Vaping Use: Never used  ?Substance Use Topics  ? Alcohol use: Yes  ?  Comment: occasional   ?  Drug use: No  ? ?Current Outpatient Medications  ?Medication Sig Dispense Refill  ? aspirin EC 81 MG tablet Take 81 mg by mouth daily. Swallow whole.    ? atorvastatin (LIPITOR) 10 MG tablet Take 1 tablet by mouth once daily 90 tablet 0  ? cholecalciferol (VITAMIN D3) 25 MCG (1000 UNIT) tablet Take 1,000 Units by mouth daily.    ? Insulin Pen Needle (RELION PEN NEEDLES) 32G X 4 MM MISC USE TO INJECT MEDICATION ONCE DAILY Dx 10.9 100 each 3  ? JARDIANCE 25 MG TABS tablet TAKE 1 TABLET BY MOUTH BEFORE BREAKFAST 90 tablet 0  ? ondansetron (ZOFRAN ODT) 8 MG disintegrating tablet Take 1 tablet (8 mg total) by mouth every 8 (eight) hours as needed for nausea or vomiting. 20 tablet 0  ? ondansetron (ZOFRAN-ODT) 4 MG disintegrating tablet Take 1 tablet (4 mg total) by mouth every 8 (eight)  hours as needed for nausea or vomiting. 20 tablet 0  ? PARoxetine (PAXIL) 30 MG tablet Take 1 tablet by mouth once daily 90 tablet 3  ? Semaglutide, 1 MG/DOSE, 4 MG/3ML SOPN Inject 1 mg as directed once a week. 9 mL 4  ? SEMGLEE, YFGN, 100 UNIT/ML Pen INJECT 0.7 MLS (70 UNITS TOTAL) SUBCUTANEOUSLY ONCE DAILY 60 mL 0  ? SUMAtriptan (IMITREX) 100 MG tablet TAKE 1 TABLET BY MOUTH EVERY 2 HOURS AS NEEDED FOR MIGRAINE HEADACHE 10 tablet 3  ? telmisartan-hydrochlorothiazide (MICARDIS HCT) 40-12.5 MG tablet Take 1/2 (one-half) tablet by mouth once daily 45 tablet 0  ? valACYclovir (VALTREX) 1000 MG tablet Take 1,000 mg by mouth 2 (two) times daily.    ? insulin glargine (SEMGLEE) 100 UNIT/ML Solostar Pen Inject 70 Units into the skin daily. 60 mL 3  ? ?No current facility-administered medications for this visit.  ? ?No Known Allergies ? ? ?Review of Systems: ?All systems reviewed and negative except where noted in HPI.  ? ?Lab Results  ?Component Value Date  ? WBC 10.3 07/31/2021  ? HGB 12.2 07/31/2021  ? HCT 36.9 07/31/2021  ? MCV 98.1 07/31/2021  ? PLT 345 07/31/2021  ? ? ?Lab Results  ?Component Value Date  ? CREATININE 0.68 08/01/2021  ? BUN 8 08/01/2021  ? NA 140 08/01/2021  ? K 3.9 08/01/2021  ? CL 107 08/01/2021  ? CO2 26 08/01/2021  ? ? ?Lab Results  ?Component Value Date  ? ALT 24 07/29/2021  ? AST 26 07/29/2021  ? ALKPHOS 69 07/29/2021  ? BILITOT 1.2 07/29/2021  ? ? ? ?Physical Exam: ?BP (!) 140/100   Pulse 81   Ht '5\' 4"'$  (1.626 m)   Wt 197 lb 4 oz (89.5 kg)   SpO2 96%   BMI 33.86 kg/m?  ?Constitutional: Pleasant,well-developed, female in no acute distress. ?HEENT: Normocephalic and atraumatic. Conjunctivae are normal. No scleral icterus. ?Neck supple.  ?Cardiovascular: Normal rate, regular rhythm.  ?Pulmonary/chest: Effort normal and breath sounds normal.  ?Abdominal: Soft, nondistended, nontender.  There are no masses palpable.  ?Extremities: no edema ?Lymphadenopathy: No cervical adenopathy  noted. ?Neurological: Alert and oriented to person place and time. ?Skin: Skin is warm and dry. No rashes noted. ?Psychiatric: Normal mood and affect. Behavior is normal. ? ? ?ASSESSMENT AND PLAN: ?61 year old female here to reestablish care for the following: ? ?Gastroparesis ?Obesity ?Colon cancer screening ? ?Patient with symptoms very consistent with gastroparesis.  This has been ongoing, she has been on a variety of regimens to include Ozempic and Mounjaro which could be making symptoms  worse.  She had an EGD in the past but this was limited exam due to retained food.  I am recommending a EGD to make sure we clear her upper tract and include no gastric outlet obstruction/PUD etc.  I discussed risks and benefits of this as well as that of anesthesia and she wanted to proceed.  In the interim I do think we should try her on some Reglan, she has not tried this in the past when it was last recommended.  Discussed risks of Reglan, risk of tardive dyskinesia is very rare, especially at low-dose.  We will start her on 5 mg p.o. 3 times daily for 1 month trial to see how she does. ? ?With improvement of her diabetes hopefully her stomach will function better.  She has had a difficult time losing weight, I offered her referral to bariatrics at [facility], Dr. Karen Chafe, she wanted to proceed with that. ? ?Otherwise due for screening colonoscopy.  This can be done at the same time as her upper endoscopy, counseled her on risks and benefits of the exam and anesthesia and she wants to proceed.  Further recommendations pending results. ? ?Plan: ?- schedule for EGD and colonoscopy ?- trial of Reglan '5mg'$  TID prior to meals - 1 month supply to start ?- referral to Dr. Johnette Abraham - bariatrics [facility] - for weight loss ? ?Jolly Mango, MD ?[facility] ? ?CC: ?[name], DO ? ?

## 2021-09-30 NOTE — Progress Notes (Signed)
17 pages of demographics and records faxed to Dr Johnette Abraham @ [facility] for weight management referral. Will await appointment information. ?

## 2021-10-24 NOTE — Progress Notes (Signed)
I have contacted Dr Marga Melnick office to inquire about an appointment for patient. According to the office, they have attempted to reach the patient by phone x 10 days and have sent a letter to her home address. No return correspondence has been noted to date.

## 2021-11-05 ENCOUNTER — Other Ambulatory Visit: Payer: Self-pay | Admitting: Family Medicine

## 2021-11-10 ENCOUNTER — Other Ambulatory Visit: Payer: Self-pay | Admitting: Gastroenterology

## 2021-11-23 ENCOUNTER — Encounter: Payer: Self-pay | Admitting: Family Medicine

## 2021-11-23 ENCOUNTER — Telehealth: Payer: Self-pay | Admitting: Family Medicine

## 2021-11-23 MED ORDER — INSULIN GLARGINE-YFGN 100 UNIT/ML ~~LOC~~ SOPN: 70.0000 [IU] | PEN_INJECTOR | Freq: Every day | SUBCUTANEOUS | 0 refills | Status: DC

## 2021-11-23 NOTE — Telephone Encounter (Signed)
LMTCB TO SCHEDULE APPT LETTER MAILED 

## 2021-11-23 NOTE — Telephone Encounter (Signed)
Pt scheduled for 7/25 next available with [facility] but will need a refill before then

## 2021-11-23 NOTE — Telephone Encounter (Signed)
Pt aware refill sent to pharmacy 

## 2021-11-23 NOTE — Telephone Encounter (Signed)
[facility] NTBS 30 days given 11/07/21

## 2021-11-23 NOTE — Addendum Note (Signed)
Addended by: Antonietta Barcelona D on: 11/23/2021 04:40 PM   Modules accepted: Orders

## 2021-12-01 ENCOUNTER — Ambulatory Visit (AMBULATORY_SURGERY_CENTER): Payer: BC Managed Care – PPO | Admitting: Gastroenterology

## 2021-12-01 ENCOUNTER — Encounter: Payer: Self-pay | Admitting: Gastroenterology

## 2021-12-01 VITALS — BP 116/74 | HR 72 | Temp 96.6°F | Resp 14 | Ht 64.0 in | Wt 197.0 lb

## 2021-12-01 DIAGNOSIS — Z1211 Encounter for screening for malignant neoplasm of colon: Secondary | ICD-10-CM

## 2021-12-01 DIAGNOSIS — K209 Esophagitis, unspecified without bleeding: Secondary | ICD-10-CM

## 2021-12-01 DIAGNOSIS — K2100 Gastro-esophageal reflux disease with esophagitis, without bleeding: Secondary | ICD-10-CM | POA: Diagnosis not present

## 2021-12-01 DIAGNOSIS — D125 Benign neoplasm of sigmoid colon: Secondary | ICD-10-CM | POA: Diagnosis not present

## 2021-12-01 DIAGNOSIS — K3184 Gastroparesis: Secondary | ICD-10-CM

## 2021-12-01 MED ORDER — SODIUM CHLORIDE 0.9 % IV SOLN: 500.0000 mL | INTRAVENOUS | Status: DC

## 2021-12-01 NOTE — Patient Instructions (Addendum)
Please read handouts provided. Continue present medications, including Reglan. Await pathology results. Pepcid 20 mg/day or omeprazole '20mg'$ /day for 4 weeks and then as needed.   YOU HAD AN ENDOSCOPIC PROCEDURE TODAY AT [facility]:   Refer to the procedure report that was given to you for any specific questions about what was found during the examination.  If the procedure report does not answer your questions, please call your gastroenterologist to clarify.  If you requested that your care partner not be given the details of your procedure findings, then the procedure report has been included in a sealed envelope for you to review at your convenience later.  YOU SHOULD EXPECT: Some feelings of bloating in the abdomen. Passage of more gas than usual.  Walking can help get rid of the air that was put into your GI tract during the procedure and reduce the bloating. If you had a lower endoscopy (such as a colonoscopy or flexible sigmoidoscopy) you may notice spotting of blood in your stool or on the toilet paper. If you underwent a bowel prep for your procedure, you may not have a normal bowel movement for a few days.  Please Note:  You might notice some irritation and congestion in your nose or some drainage.  This is from the oxygen used during your procedure.  There is no need for concern and it should clear up in a day or so.  SYMPTOMS TO REPORT IMMEDIATELY:  Following lower endoscopy (colonoscopy or flexible sigmoidoscopy):  Excessive amounts of blood in the stool  Significant tenderness or worsening of abdominal pains  Swelling of the abdomen that is new, acute  Fever of 100F or higher  Following upper endoscopy (EGD)  Vomiting of blood or coffee ground material  New chest pain or pain under the shoulder blades  Painful or persistently difficult swallowing  New shortness of breath  Fever of 100F or higher  Black, tarry-looking stools  For urgent or emergent issues, a  gastroenterologist can be reached at any hour by calling [phone]. Do not use MyChart messaging for urgent concerns.    DIET:  We do recommend a small meal at first, but then you may proceed to your regular diet.  Drink plenty of fluids but you should avoid alcoholic beverages for 24 hours.  ACTIVITY:  You should plan to take it easy for the rest of today and you should NOT DRIVE or use heavy machinery until tomorrow (because of the sedation medicines used during the test).    FOLLOW UP: Our staff will call the number listed on your records the next business day following your procedure.  We will call around 7:15- 8:00 am to check on you and address any questions or concerns that you may have regarding the information given to you following your procedure. If we do not reach you, we will leave a message.  If you develop any symptoms (ie: fever, flu-like symptoms, shortness of breath, cough etc.) before then, please call [phone].  If you test positive for Covid 19 in the 2 weeks post procedure, please call and report this information to [place].    If any biopsies were taken you will be contacted by phone or by letter within the next 1-3 weeks.  Please call us at [phone] if you have not heard about the biopsies in 3 weeks.    SIGNATURES/CONFIDENTIALITY: You and/or your care partner have signed paperwork which will be entered into your electronic medical record.  These  signatures attest to the fact that that the information above on your After Visit Summary has been reviewed and is understood.  Full responsibility of the confidentiality of this discharge information lies with you and/or your care-partner.

## 2021-12-01 NOTE — Progress Notes (Signed)
Report to PACU, RN, vss, BBS= Clear.  

## 2021-12-01 NOTE — Op Note (Signed)
[facility] Patient Name: Carrie [name] Procedure Date: 12/01/2021 7:49 AM MRN: [id number] Endoscopist: Remo Lipps P. Havery Moros , MD Age: 61 Referring MD:  Date of Birth: 22-Dec-1960 Gender: Female Account #: [id number] Procedure:                Colonoscopy Indications:              Screening for colorectal malignant neoplasm Medicines:                Monitored Anesthesia Care Procedure:                Pre-Anesthesia Assessment:                           - Prior to the procedure, a History and Physical                            was performed, and patient medications and                            allergies were reviewed. The patient's tolerance of                            previous anesthesia was also reviewed. The risks                            and benefits of the procedure and the sedation                            options and risks were discussed with the patient.                            All questions were answered, and informed consent                            was obtained. Prior Anticoagulants: The patient has                            taken no previous anticoagulant or antiplatelet                            agents. ASA Grade Assessment: II - A patient with                            mild systemic disease. After reviewing the risks                            and benefits, the patient was deemed in                            satisfactory condition to undergo the procedure.                           After obtaining informed consent, the colonoscope  was passed under direct vision. Throughout the                            procedure, the patient's blood pressure, pulse, and                            oxygen saturations were monitored continuously. The                            CF [id number] #[id number] was introduced through the anus                            and advanced to the the cecum, identified by                            appendiceal  orifice and ileocecal valve. The                            colonoscopy was performed without difficulty. The                            patient tolerated the procedure well. The quality                            of the bowel preparation was good. The ileocecal                            valve, appendiceal orifice, and rectum were                            photographed. Scope In: 8:09:46 AM Scope Out: 8:29:04 AM Scope Withdrawal Time: 0 hours 13 minutes 55 seconds  Total Procedure Duration: 0 hours 19 minutes 18 seconds  Findings:                 The perianal and digital rectal examinations were                            normal.                           A few small-mouthed diverticula were found in the                            sigmoid colon.                           A 3 mm polyp was found in the sigmoid colon. The                            polyp was sessile. The polyp was removed with a                            cold snare. Resection and retrieval were complete.  There was a medium-sized lipoma, in the cecum.                           Internal hemorrhoids were found during                            retroflexion. The hemorrhoids were small.                           There was some looping in the right colon. The exam                            was otherwise without abnormality. Complications:            No immediate complications. Estimated blood loss:                            Minimal. Estimated Blood Loss:     Estimated blood loss was minimal. Impression:               - Diverticulosis in the sigmoid colon.                           - One 3 mm polyp in the sigmoid colon, removed with                            a cold snare. Resected and retrieved.                           - Medium-sized lipoma in the cecum.                           - Internal hemorrhoids.                           - The examination was otherwise normal. Recommendation:           -  Patient has a contact number available for                            emergencies. The signs and symptoms of potential                            delayed complications were discussed with the                            patient. Return to normal activities tomorrow.                            Written discharge instructions were provided to the                            patient.                           - Resume previous diet.                           -  Continue present medications.                           - Await pathology results. Remo Lipps P. Zevin Nevares, MD 12/01/2021 8:32:47 AM This report has been signed electronically.

## 2021-12-01 NOTE — Op Note (Signed)
[facility] Patient Name: Carrie [name] Procedure Date: 12/01/2021 7:49 AM MRN: [id number] Endoscopist: Remo Lipps P. Havery Moros , MD Age: 61 Referring MD:  Date of Birth: 03-23-1961 Gender: Female Account #: [id number] Procedure:                Upper GI endoscopy Indications:              Suspected gastroparesis - last EGD limited by                            residual food in the stomach. On Ozempic, improved                            with Reglan recently Medicines:                Monitored Anesthesia Care Procedure:                Pre-Anesthesia Assessment:                           - Prior to the procedure, a History and Physical                            was performed, and patient medications and                            allergies were reviewed. The patient's tolerance of                            previous anesthesia was also reviewed. The risks                            and benefits of the procedure and the sedation                            options and risks were discussed with the patient.                            All questions were answered, and informed consent                            was obtained. Prior Anticoagulants: The patient has                            taken no previous anticoagulant or antiplatelet                            agents. ASA Grade Assessment: II - A patient with                            mild systemic disease. After reviewing the risks                            and benefits, the patient was deemed in  satisfactory condition to undergo the procedure.                           After obtaining informed consent, the endoscope was                            passed under direct vision. Throughout the                            procedure, the patient's blood pressure, pulse, and                            oxygen saturations were monitored continuously. The                            Endoscope was introduced through  the mouth, and                            advanced to the second part of duodenum. The upper                            GI endoscopy was accomplished without difficulty.                            The patient tolerated the procedure well. Scope In: Scope Out: Findings:                 Esophagogastric landmarks were identified: the                            Z-line was found at 37 cm, the gastroesophageal                            junction was found at 37 cm and the upper extent of                            the gastric folds was found at 37 cm from the                            incisors.                           LA Grade A esophagitis was found at the GEJ.                           Areas of benign ectopic gastric mucosa were found                            in the upper third of the esophagus.                           The exam of the esophagus was otherwise normal.  The entire examined stomach was normal. No outlet                            obstruction                           The examined duodenum was normal. Complications:            No immediate complications. Estimated blood loss:                            Minimal. Estimated Blood Loss:     Estimated blood loss was minimal. Impression:               - Esophagogastric landmarks identified.                           - LA Grade A reflux esophagitis.                           - Ectopic gastric mucosa in the upper third of the                            esophagus.                           - Normal esophagus otherwise                           - Normal stomach.                           - Normal examined duodenum.                           No outlet obstruction or anatomic abnormality to                            cause symptoms, gastroparesis is the likely                            diagnosis. Improved with Reglan Recommendation:           - Patient has a contact number available for                             emergencies. The signs and symptoms of potential                            delayed complications were discussed with the                            patient. Return to normal activities tomorrow.                            Written discharge instructions were provided to the  patient.                           - Resume previous diet.                           - Continue present medications (Reglan)                           - Pepcid '20mg'$  / day or omeprazole '20mg'$  / day for 4                            weeks and then as needed to treat GERD / esophagitis                           - Await pathology results. Remo Lipps P. Havery Moros, MD 12/01/2021 8:37:58 AM This report has been signed electronically.

## 2021-12-01 NOTE — Progress Notes (Signed)
[facility] History and Physical   Primary Care Physician:  [name], DO   Reason for Procedure:   Colon cancer screening, gastroparesis  Plan:    Colonoscopy and EGD     HPI: Carrie [name] is a 61 y.o. female  here for colonoscopy screening - last exam 10 years ago. Patient denies any bowel symptoms at this time. No family history of colon cancer known. Otherwise history of gastroparesis - last EGD to evaluate this was limited by food in stomach and exam incomplete. Has been on Reglan lately which has helped her a lot. On Ozempic. Otherwise feels well without any cardiopulmonary symptoms. Have discussed risks / benefits and she wishes to proceed.   Past Medical History:  Diagnosis Date   Depression    Diabetes mellitus without complication ([place])    Fever blister    Gastroparesis    Hyperlipidemia    Hypertension    Migraine    Seizures ([place])    Pt states she had 1st seizure in March 2023    Past Surgical History:  Procedure Laterality Date   BACK SURGERY     CHOLECYSTECTOMY  2004    Prior to Admission medications   Medication Sig Start Date End Date Taking? Authorizing Provider  aspirin EC 81 MG tablet Take 81 mg by mouth daily. Swallow whole.   Yes [provider]  atorvastatin (LIPITOR) 10 MG tablet Take 1 tablet by mouth once daily 07/08/21  Yes Gottschalk, Ashly M, DO  cholecalciferol (VITAMIN D3) 25 MCG (1000 UNIT) tablet Take 1,000 Units by mouth daily.   Yes [provider]  insulin glargine-yfgn (SEMGLEE, YFGN,) 100 UNIT/ML Pen Inject 70 Units into the skin daily. 11/23/21  Yes Gottschalk, Ashly M, DO  JARDIANCE 25 MG TABS tablet TAKE 1 TABLET BY MOUTH BEFORE BREAKFAST 08/08/21  Yes Gottschalk, Ashly M, DO  metoCLOPramide (REGLAN) 5 MG tablet TAKE 1 TABLET BY MOUTH THREE TIMES DAILY BEFORE MEAL(S) 11/10/21  Yes Adom Schoeneck, Carlota Raspberry, MD  PARoxetine (PAXIL) 30 MG tablet Take 1 tablet by mouth once daily 06/08/21  Yes Gottschalk, Ashly  M, DO  Insulin Pen Needle (RELION PEN NEEDLES) 32G X 4 MM MISC USE TO INJECT MEDICATION ONCE DAILY Dx 10.9 08/03/21   Gottschalk, Ashly M, DO  ondansetron (ZOFRAN ODT) 8 MG disintegrating tablet Take 1 tablet (8 mg total) by mouth every 8 (eight) hours as needed for nausea or vomiting. 08/07/18   Terald Sleeper, PA-C  ondansetron (ZOFRAN-ODT) 4 MG disintegrating tablet Take 1 tablet (4 mg total) by mouth every 8 (eight) hours as needed for nausea or vomiting. 08/26/21   [name], DO  Semaglutide, 1 MG/DOSE, 4 MG/3ML SOPN Inject 1 mg as directed once a week. 08/26/21   [name], DO  SUMAtriptan (IMITREX) 100 MG tablet TAKE 1 TABLET BY MOUTH EVERY 2 HOURS AS NEEDED FOR MIGRAINE HEADACHE 01/21/21   Ronnie Doss M, DO  telmisartan-hydrochlorothiazide (MICARDIS HCT) 40-12.5 MG tablet Take 1/2 (one-half) tablet by mouth once daily Patient not taking: Reported on 12/01/2021 08/08/21   [name], DO  valACYclovir (VALTREX) 1000 MG tablet Take 1,000 mg by mouth 2 (two) times daily. 07/06/21   [provider]    Current Outpatient Medications  Medication Sig Dispense Refill   aspirin EC 81 MG tablet Take 81 mg by mouth daily. Swallow whole.     atorvastatin (LIPITOR) 10 MG tablet Take 1 tablet by mouth once daily 90 tablet 0  cholecalciferol (VITAMIN D3) 25 MCG (1000 UNIT) tablet Take 1,000 Units by mouth daily.     insulin glargine-yfgn (SEMGLEE, YFGN,) 100 UNIT/ML Pen Inject 70 Units into the skin daily. 15 mL 0   JARDIANCE 25 MG TABS tablet TAKE 1 TABLET BY MOUTH BEFORE BREAKFAST 90 tablet 0   metoCLOPramide (REGLAN) 5 MG tablet TAKE 1 TABLET BY MOUTH THREE TIMES DAILY BEFORE MEAL(S) 90 tablet 2   PARoxetine (PAXIL) 30 MG tablet Take 1 tablet by mouth once daily 90 tablet 3   Insulin Pen Needle (RELION PEN NEEDLES) 32G X 4 MM MISC USE TO INJECT MEDICATION ONCE DAILY Dx 10.9 100 each 3   ondansetron (ZOFRAN ODT) 8 MG disintegrating tablet Take 1 tablet (8 mg total) by  mouth every 8 (eight) hours as needed for nausea or vomiting. 20 tablet 0   ondansetron (ZOFRAN-ODT) 4 MG disintegrating tablet Take 1 tablet (4 mg total) by mouth every 8 (eight) hours as needed for nausea or vomiting. 20 tablet 0   Semaglutide, 1 MG/DOSE, 4 MG/3ML SOPN Inject 1 mg as directed once a week. 9 mL 4   SUMAtriptan (IMITREX) 100 MG tablet TAKE 1 TABLET BY MOUTH EVERY 2 HOURS AS NEEDED FOR MIGRAINE HEADACHE 10 tablet 3   telmisartan-hydrochlorothiazide (MICARDIS HCT) 40-12.5 MG tablet Take 1/2 (one-half) tablet by mouth once daily (Patient not taking: Reported on 12/01/2021) 45 tablet 0   valACYclovir (VALTREX) 1000 MG tablet Take 1,000 mg by mouth 2 (two) times daily.     Current Facility-Administered Medications  Medication Dose Route Frequency Provider Last Rate Last Admin   0.9 %  sodium chloride infusion  500 mL Intravenous Continuous Katlen Seyer, Carlota Raspberry, MD        Allergies as of 12/01/2021   (No Known Allergies)    Family History  Problem Relation Age of Onset   Ovarian cancer Mother    Mental illness Daughter    Migraines Daughter    Colon cancer Neg Hx    Rectal cancer Neg Hx    Throat cancer Neg Hx     Social History   Socioeconomic History   Marital status: Married    Spouse name: Not on file   Number of children: 2   Years of education: Not on file   Highest education level: Not on file  Occupational History   Occupation: customer service  Tobacco Use   Smoking status: Former    Packs/day: 0.50    Years: 40.00    Total pack years: 20.00    Types: Cigarettes    Quit date: 2006    Years since quitting: 17.5   Smokeless tobacco: Never  Vaping Use   Vaping Use: Never used  Substance and Sexual Activity   Alcohol use: Yes    Comment: occasional    Drug use: No   Sexual activity: Not on file  Other Topics Concern   Not on file  Social History Narrative   .      Right handed    Lives with husband    Social Determinants of Health    Financial Resource Strain: Not on file  Food Insecurity: Not on file  Transportation Needs: Not on file  Physical Activity: Not on file  Stress: Not on file  Social Connections: Not on file  Intimate Partner Violence: Not on file    Review of Systems: All other review of systems negative except as mentioned in the HPI.  Physical Exam: Vital signs BP 101/65  Pulse 81   Temp (!) 96.6 F (35.9 C)   Ht '5\' 4"'$  (1.626 m)   Wt 197 lb (89.4 kg)   SpO2 96%   BMI 33.81 kg/m   General:   Alert,  Well-developed, pleasant and cooperative in NAD Lungs:  Clear throughout to auscultation.   Heart:  Regular rate and rhythm Abdomen:  Soft, nontender and nondistended.   Neuro/Psych:  Alert and cooperative. Normal mood and affect. A and O x 3  Jolly Mango, MD [facility]

## 2021-12-01 NOTE — Progress Notes (Signed)
Called to room to assist during endoscopic procedure.  Patient ID and intended procedure confirmed with present staff. Received instructions for my participation in the procedure from the performing physician.  

## 2021-12-02 ENCOUNTER — Telehealth: Payer: Self-pay | Admitting: *Deleted

## 2021-12-02 NOTE — Telephone Encounter (Signed)
  Follow up Call-     12/01/2021    7:22 AM  Call back number  Post procedure Call Back phone  # [phone]  Permission to leave phone message Yes     Patient questions: Message left to call if necessary.

## 2021-12-07 ENCOUNTER — Telehealth: Payer: Self-pay

## 2021-12-07 NOTE — Telephone Encounter (Signed)
Per 12/01/21 procedure report - Pepcid '20mg'$  / day or omeprazole '20mg'$  / day for 4 weeks and then as needed to treat GERD / esophagitis  No medication prescriptions on file for either medication. I attempted to reach pt. I left her a detailed vm asking her to confirm wether or not she was able to get either medication.

## 2021-12-08 MED ORDER — OMEPRAZOLE 20 MG PO CPDR: DELAYED_RELEASE_CAPSULE | ORAL | 1 refills | Status: DC

## 2021-12-08 NOTE — Telephone Encounter (Signed)
Called and spoke with patient. I asked if she was going to get the recommended medication OTC or if she wanted an RX. Patient reported that an RX is probably cheaper. Pt would like RX for Omeprazole sent to [facility] in [place]. Pt is aware that she will take this daily for 4 weeks and then can take as needed for GERD. Pt verbalized understanding and had no concerns at the end of the call.

## 2021-12-27 ENCOUNTER — Encounter: Payer: Self-pay | Admitting: Family Medicine

## 2021-12-27 ENCOUNTER — Ambulatory Visit: Payer: BC Managed Care – PPO | Admitting: Family Medicine

## 2021-12-27 VITALS — BP 106/70 | HR 82 | Temp 97.1°F | Ht 64.0 in | Wt 197.6 lb

## 2021-12-27 DIAGNOSIS — E785 Hyperlipidemia, unspecified: Secondary | ICD-10-CM

## 2021-12-27 DIAGNOSIS — E1159 Type 2 diabetes mellitus with other circulatory complications: Secondary | ICD-10-CM

## 2021-12-27 DIAGNOSIS — M65332 Trigger finger, left middle finger: Secondary | ICD-10-CM | POA: Diagnosis not present

## 2021-12-27 DIAGNOSIS — M7062 Trochanteric bursitis, left hip: Secondary | ICD-10-CM

## 2021-12-27 DIAGNOSIS — I152 Hypertension secondary to endocrine disorders: Secondary | ICD-10-CM

## 2021-12-27 DIAGNOSIS — E1169 Type 2 diabetes mellitus with other specified complication: Secondary | ICD-10-CM | POA: Diagnosis not present

## 2021-12-27 DIAGNOSIS — R41840 Attention and concentration deficit: Secondary | ICD-10-CM | POA: Diagnosis not present

## 2021-12-27 LAB — BAYER DCA HB A1C WAIVED: HB A1C (BAYER DCA - WAIVED): 6.7 % — ABNORMAL HIGH (ref 4.8–5.6)

## 2021-12-27 MED ORDER — METHYLPREDNISOLONE ACETATE 40 MG/ML IJ SUSP
40.0000 mg | Freq: Once | INTRAMUSCULAR | Status: AC
  Administered 2021-12-27: 40 mg via INTRAMUSCULAR

## 2021-12-27 NOTE — Progress Notes (Signed)
Subjective: CC:DM PCP: [name], DO ONG:EXBM Carrie [name] is a 61 y.o. female presenting to clinic today for:  1. Type 2 Diabetes with hypertension, hyperlipidemia:  Patient reports compliance with all medications.  She worries that her sugar will be elevated today because she has not really been following a strict diet.  Last eye exam: Has not yet had diabetic eye exam Last foot exam: Up-to-date Last A1c:  Lab Results  Component Value Date   HGBA1C 6.7 (H) 12/27/2021   Nephropathy screen indicated?:  Up-to-date Last flu, zoster and/or pneumovax:  Immunization History  Administered Date(s) Administered   Influenza,inj,Quad PF,6+ Mos 07/07/2016, 03/28/2017, 04/02/2018, 04/07/2019, 05/11/2021   Influenza-Unspecified 04/17/2016   Moderna Sars-Covid-2 Vaccination 08/11/2019, 09/09/2019, 06/11/2020   PNEUMOCOCCAL CONJUGATE-20 05/11/2021   Pneumococcal Polysaccharide-23 01/23/2019   Tdap 01/23/2019   Zoster Recombinat (Shingrix) 11/16/2020, 06/02/2021    ROS: No chest pain shortness of breath or dizziness reported but she has been feeling her best.  She continues to have difficulty with memory and focus.  She admits that this seems to be most prominent when she is at work.  She does not really have these issues at home.  She has not yet discussed this with her neurologist but is expected to see her in the next couple of months.  She notes that she was never told that she did not have a true seizure and that this was felt to be more syncopal in nature and that is relieving to her today.  She denies any depression or anxiety type symptoms that may be contributing to memory and focus.  2.  Bilateral hip pain Patient reports bilateral hip pain.  She points to the trochanteric bursa's of bilateral hips.  She notes that the left seems to be worse than the right and is exacerbated by seated positions.  She sits quite a bit for work.  Sometimes it makes it difficult to ambulate and she  has a limp.  Not using any specific treatments for this thus far   ROS: Per HPI  No Known Allergies Past Medical History:  Diagnosis Date   Depression    Diabetes mellitus without complication ([place])    Fever blister    Gastroparesis    Hyperlipidemia    Hypertension    Migraine    Seizures ([place])    Pt states she had 1st seizure in March 2023    Current Outpatient Medications:    aspirin EC 81 MG tablet, Take 81 mg by mouth daily. Swallow whole., Disp: , Rfl:    atorvastatin (LIPITOR) 10 MG tablet, Take 1 tablet by mouth once daily, Disp: 90 tablet, Rfl: 0   cholecalciferol (VITAMIN D3) 25 MCG (1000 UNIT) tablet, Take 1,000 Units by mouth daily., Disp: , Rfl:    insulin glargine-yfgn (SEMGLEE, YFGN,) 100 UNIT/ML Pen, Inject 70 Units into the skin daily., Disp: 15 mL, Rfl: 0   Insulin Pen Needle (RELION PEN NEEDLES) 32G X 4 MM MISC, USE TO INJECT MEDICATION ONCE DAILY Dx 10.9, Disp: 100 each, Rfl: 3   JARDIANCE 25 MG TABS tablet, TAKE 1 TABLET BY MOUTH BEFORE BREAKFAST, Disp: 90 tablet, Rfl: 0   metoCLOPramide (REGLAN) 5 MG tablet, TAKE 1 TABLET BY MOUTH THREE TIMES DAILY BEFORE MEAL(S), Disp: 90 tablet, Rfl: 2   omeprazole (PRILOSEC) 20 MG capsule, Take 1 capsule (20 mg total) by mouth daily, 30-60 minutes before breakfast for 4 weeks. Take as needed after 4 weeks., Disp: 30 capsule, Rfl: 1  ondansetron (ZOFRAN ODT) 8 MG disintegrating tablet, Take 1 tablet (8 mg total) by mouth every 8 (eight) hours as needed for nausea or vomiting., Disp: 20 tablet, Rfl: 0   ondansetron (ZOFRAN-ODT) 4 MG disintegrating tablet, Take 1 tablet (4 mg total) by mouth every 8 (eight) hours as needed for nausea or vomiting., Disp: 20 tablet, Rfl: 0   PARoxetine (PAXIL) 30 MG tablet, Take 1 tablet by mouth once daily, Disp: 90 tablet, Rfl: 3   Semaglutide, 1 MG/DOSE, 4 MG/3ML SOPN, Inject 1 mg as directed once a week., Disp: 9 mL, Rfl: 4   SUMAtriptan (IMITREX) 100 MG tablet, TAKE 1 TABLET BY MOUTH EVERY 2  HOURS AS NEEDED FOR MIGRAINE HEADACHE, Disp: 10 tablet, Rfl: 3   valACYclovir (VALTREX) 1000 MG tablet, Take 1,000 mg by mouth 2 (two) times daily., Disp: , Rfl:    telmisartan-hydrochlorothiazide (MICARDIS HCT) 40-12.5 MG tablet, Take 1/2 (one-half) tablet by mouth once daily (Patient not taking: Reported on 12/01/2021), Disp: 45 tablet, Rfl: 0 Social History   Socioeconomic History   Marital status: Married    Spouse name: Not on file   Number of children: 2   Years of education: Not on file   Highest education level: Not on file  Occupational History   Occupation: customer service  Tobacco Use   Smoking status: Former    Packs/day: 0.50    Years: 40.00    Total pack years: 20.00    Types: Cigarettes    Quit date: 2006    Years since quitting: 17.5   Smokeless tobacco: Never  Vaping Use   Vaping Use: Never used  Substance and Sexual Activity   Alcohol use: Yes    Comment: occasional    Drug use: No   Sexual activity: Not on file  Other Topics Concern   Not on file  Social History Narrative   .      Right handed    Lives with husband    Social Determinants of Health   Financial Resource Strain: Not on file  Food Insecurity: Not on file  Transportation Needs: Not on file  Physical Activity: Not on file  Stress: Not on file  Social Connections: Not on file  Intimate Partner Violence: Not on file   Family History  Problem Relation Age of Onset   Ovarian cancer Mother    Mental illness Daughter    Migraines Daughter    Colon cancer Neg Hx    Rectal cancer Neg Hx    Throat cancer Neg Hx     Objective: Office vital signs reviewed. BP 106/70   Pulse 82   Temp (!) 97.1 F (36.2 C)   Ht '5\' 4"'$  (1.626 m)   Wt 197 lb 9.6 oz (89.6 kg)   SpO2 94%   BMI 33.92 kg/m   Physical Examination:  General: Awake, alert, obese, No acute distress HEENT: White.  Moist mucous membranes Cardio: regular rate and rhythm, S1S2 heard, no murmurs appreciated Pulm: clear to  auscultation bilaterally, no wheezes, rhonchi or rales; normal work of breathing on room air MSK: Trigger finger of left middle finger appreciated.  She has visible clicking.  Left hip: She has tenderness palpation over the trochanteric bursa on the left side somewhat posteriorly.  There are no palpable abnormalities in this area nor appreciable deformities.  She does have a sunburn near the area however.  Assessment/ Plan: 61 y.o. female   Trochanteric bursitis, left hip - Plan: methylPREDNISolone acetate (DEPO-MEDROL) injection 40  mg  Type 2 diabetes mellitus with other specified complication, without long-term current use of insulin (HCC) - Plan: Bayer DCA Hb A1c Waived  Hyperlipidemia associated with type 2 diabetes mellitus ([place])  Hypertension associated with diabetes ([place])  Trigger middle finger of left hand  Difficulty concentrating  Left trochanteric bursa injected today.  Patient tolerated procedure without difficulty.  Advised that this may affect her blood sugars temporarily but that should resolve.  If she finds this to be helpful she is welcome to return I am glad to inject the right hip.  Home care instructions reviewed as well as home physical therapy exercises to alleviate bursitis.  I encouraged her to obtain a standing desk since sitting exacerbates this issue  Sugar shows good control with A1c less than 7 today.  She is to update her diabetic eye exam and have these records sent to our office  Continue blood pressure and cholesterol control as directed  She will set up a an appointment with Dr. Warrick Parisian did have a trigger finger injected.  I discussed with her the pathophysiology of the trigger finger  Initially, uncertain etiology of difficulty with concentration.  She denies any psychological issues including depression or anxiety at this time.  Uncertain if this is related to perhaps an underlying PTSD with regards to what happened with her syncopal episode.  I  considered perhaps adding something like Wellbutrin but I would like to get the approval from her neurologist before starting something that may increase her risk for seizures.  Again it it sounds like her symptoms were in fact syncopal and not seizure in nature.  We will CC her chart to neurology today.  I wonder if perhaps neuropsych might be available at their office for consultation   Orders Placed This Encounter  Procedures   Bayer [place] Hb A1c Waived   No orders of the defined types were placed in this encounter.    [name], DO [place] [phone]

## 2021-12-27 NOTE — Patient Instructions (Addendum)
Hip Bursitis  Hip bursitis is swelling of one or more fluid-filled sacs (bursae) in your hip joint. If the bursa becomes irritated, it can fill with extra fluid and become swollen. This condition can cause pain, and your symptoms may come and go over time. What are the causes? Repeated use of your hip muscles. Injury to the hip. Weak butt muscles. Bone spurs. Infection. In some cases, the cause may not be known. What increases the risk? Having a past hip injury or hip surgery. Having a condition, such as arthritis, gout, diabetes, or thyroid disease. Having spine problems. Having one leg that is shorter than the other. Running a lot or doing long-distance running. Playing sports where there is a risk of injury or falling, such as football, martial arts, or skiing. What are the signs or symptoms? Symptoms may come and go, and they often include: Pain in the hip or groin area. Pain may get worse when you move your hip. Tenderness and swelling of the hip. In rare cases, the bursa may become infected. If this happens, you may: Get a fever. Have warmth and redness in the hip area. How is this treated? This condition is treated by: Resting your hip. Icing your hip. Wrapping the hip area with an elastic bandage (compression wrap). Keeping the hip raised. Other treatments may include: Using crutches, a cane, or a walker. Medicines. Draining fluid out of the bursa. Surgery to take out a bursa. This is rare. Long-term treatment may include: Doing exercises to help your strength and flexibility. Lifestyle changes like losing weight to lessen the strain on your hip. Follow these instructions at home: Managing pain, stiffness, and swelling     If told, put ice on the painful area. To do this: Put ice in a plastic bag. Place a towel between your skin and the bag. Leave the ice on for 20 minutes, 2-3 times a day. Take off the ice if your skin turns bright red. This is very important.  If you cannot feel pain, heat, or cold, you have a greater risk of damage to the area. Raise your hip by putting a pillow under your hips while you lie down. Stop if you feel pain. If told, put heat on the affected area. Do this as often as told by your doctor. Use the heat source that your doctor recommends, such as a moist heat pack or a heating pad. Place a towel between your skin and the heat source. Leave the heat on for 20-30 minutes. Take off the heat if your skin turns bright red. This is very important. If you cannot feel pain, heat, or cold, you have a greater risk of getting burned. Activity Do not use your hip to support your body weight until your doctor says that you can. Use crutches, a cane, or a walker as told by your doctor. If the affected leg is one that you use to drive, ask your doctor if it is safe to drive. Rest and protect your hip as much as you can until you feel better. Return to your normal activities when your doctor says that it is safe. Do exercises as told by your doctor. General instructions Take over-the-counter and prescription medicines only as told by your doctor. Gently rub and stretch your injured area as often as is comfortable. Wear elastic bandages only as told by your doctor. If one of your legs is shorter than the other, get fitted for a shoe insert or orthotic. Keep a healthy  weight. Follow instructions from your doctor. Keep all follow-up visits. How is this prevented? Exercise regularly or as told by your doctor. Wear the right shoes for the sport you play and for daily activities. Warm up and stretch before being active. Cool down and stretch after being active. Take breaks often from repeated activity. Avoid activities that bother your hip or cause pain. Avoid sitting down for a long time. Where to find more information American Academy of [employer]opa[employer]geons: orthoinfo.aaos.org Contact a doctor if: You have a fever. You have new  symptoms. You have trouble walking or doing everyday activities. You have pain that gets worse or does not get better with medicine. The skin around your hip is red. You get a feeling of warmth in your hip area. Get help right away if: You cannot move your hip. You have very bad pain. You cannot control the muscles in your feet. Summary Hip bursitis is swelling of one or more fluid-filled sacs (bursae) in your hip joint. Symptoms often come and go over time. This condition is often treated by resting and icing the hip. It also may help to keep the area raised and wrapped in an elastic bandage. Other treatments may be needed. This information is not intended to replace advice given to you by your health care provider. Make sure you discuss any questions you have with your health care provider.  Trigger Finger  Trigger finger, also called stenosing tenosynovitis,  is a condition that causes a finger to get stuck in a bent position. Each finger has a tendon, which is a tough, cord-like tissue that connects muscle to bone, and each tendon passes through a tunnel of tissue called a tendon sheath. To move your finger, your tendon needs to glide freely through the sheath. Trigger finger happens when the tendon or the sheath thickens, making it difficult to move your finger. Trigger finger can affect any finger or a thumb. It may affect more than one finger. Mild cases may clear up with rest and medicine. Severe cases require more treatment. What are the causes? Trigger finger is caused by a thickened finger tendon or tendon sheath. The cause of this thickening is not known. What increases the risk? The following factors may make you more likely to develop this condition: Doing activities that require a strong grip. Having rheumatoid arthritis, gout, or diabetes. Being 91-23 years old. Being female. What are the signs or symptoms? Symptoms of this condition include: Pain when bending or  straightening your finger. Tenderness or swelling where your finger attaches to the palm of your hand. A lump in the palm of your hand or on the inside of your finger. Hearing a noise like a pop or a snap when you try to straighten your finger. Feeling a catching or locking sensation when you try to straighten your finger. Being unable to straighten your finger. How is this diagnosed? This condition is diagnosed based on your symptoms and a physical exam. How is this treated? This condition may be treated by: Resting your finger and avoiding activities that make symptoms worse. Wearing a finger splint to keep your finger extended. Taking NSAIDs, such as ibuprofen, to relieve pain and swelling. Doing gentle exercises to stretch the finger as told by your health care provider. Having medicine that reduces swelling and inflammation (steroids) injected into the tendon sheath. Injections may need to be repeated. Having surgery to open the tendon sheath. This may be done if other treatments do not work and  you cannot straighten your finger. You may need physical therapy after surgery. Follow these instructions at home: If you have a splint: Wear the splint as told by your health care provider. Remove it only as told by your health care provider. Loosen it if your fingers tingle, become numb, or turn cold and blue. Keep it clean. If the splint is not waterproof: Do not let it get wet. Cover it with a watertight covering when you take a bath or shower. Managing pain, stiffness, and swelling     If directed, apply heat to the affected area as often as told by your health care provider. Use the heat source that your health care provider recommends, such as a moist heat pack or a heating pad. Place a towel between your skin and the heat source. Leave the heat on for 20-30 minutes. Remove the heat if your skin turns bright red. This is especially important if you are unable to feel pain, heat, or  cold. You may have a greater risk of getting burned. If directed, put ice on the painful area. To do this: If you have a removable splint, remove it as told by your health care provider. Put ice in a plastic bag. Place a towel between your skin and the bag or between your splint and the bag. Leave the ice on for 20 minutes, 2-3 times a day.  Activity Rest your finger as told by your health care provider. Avoid activities that make the pain worse. Return to your normal activities as told by your health care provider. Ask your health care provider what activities are safe for you. Do exercises as told by your health care provider. Ask your health care provider when it is safe to drive if you have a splint on your hand. General instructions Take over-the-counter and prescription medicines only as told by your health care provider. Keep all follow-up visits as told by your health care provider. This is important. Contact a health care provider if: Your symptoms are not improving with home care. Summary Trigger finger, also called stenosing tenosynovitis, causes your finger to get stuck in a bent position. This can make it difficult and painful to straighten your finger. This condition develops when a finger tendon or tendon sheath thickens. Treatment may include resting your finger, wearing a splint, and taking medicines. In severe cases, surgery to open the tendon sheath may be needed. This information is not intended to replace advice given to you by your health care provider. Make sure you discuss any questions you have with your health care provider. Document Revised: 10/07/2018 Document Reviewed: 10/07/2018 Elsevier Patient Education  [place] Revised: 05/17/2021 Document Reviewed: 05/17/2021 Elsevier Patient Education  [place].

## 2022-01-02 ENCOUNTER — Encounter: Payer: Self-pay | Admitting: Family Medicine

## 2022-01-05 ENCOUNTER — Other Ambulatory Visit: Payer: Self-pay | Admitting: Family Medicine

## 2022-01-15 ENCOUNTER — Other Ambulatory Visit: Payer: Self-pay | Admitting: Family Medicine

## 2022-01-15 DIAGNOSIS — E1169 Type 2 diabetes mellitus with other specified complication: Secondary | ICD-10-CM

## 2022-01-26 ENCOUNTER — Other Ambulatory Visit: Payer: Self-pay | Admitting: Family Medicine

## 2022-01-27 ENCOUNTER — Other Ambulatory Visit: Payer: Self-pay | Admitting: Gastroenterology

## 2022-02-01 DIAGNOSIS — D225 Melanocytic nevi of trunk: Secondary | ICD-10-CM | POA: Diagnosis not present

## 2022-02-01 DIAGNOSIS — L821 Other seborrheic keratosis: Secondary | ICD-10-CM | POA: Diagnosis not present

## 2022-02-01 DIAGNOSIS — L570 Actinic keratosis: Secondary | ICD-10-CM | POA: Diagnosis not present

## 2022-02-01 DIAGNOSIS — L918 Other hypertrophic disorders of the skin: Secondary | ICD-10-CM | POA: Diagnosis not present

## 2022-02-03 ENCOUNTER — Other Ambulatory Visit: Payer: Self-pay | Admitting: Gastroenterology

## 2022-02-12 ENCOUNTER — Other Ambulatory Visit: Payer: Self-pay | Admitting: Family Medicine

## 2022-02-12 DIAGNOSIS — E1169 Type 2 diabetes mellitus with other specified complication: Secondary | ICD-10-CM

## 2022-02-13 DIAGNOSIS — N952 Postmenopausal atrophic vaginitis: Secondary | ICD-10-CM | POA: Diagnosis not present

## 2022-02-13 DIAGNOSIS — L900 Lichen sclerosus et atrophicus: Secondary | ICD-10-CM | POA: Diagnosis not present

## 2022-02-13 DIAGNOSIS — R102 Pelvic and perineal pain: Secondary | ICD-10-CM | POA: Diagnosis not present

## 2022-02-13 DIAGNOSIS — L293 Anogenital pruritus, unspecified: Secondary | ICD-10-CM | POA: Diagnosis not present

## 2022-02-13 DIAGNOSIS — R32 Unspecified urinary incontinence: Secondary | ICD-10-CM | POA: Diagnosis not present

## 2022-02-15 ENCOUNTER — Other Ambulatory Visit: Payer: Self-pay | Admitting: Family Medicine

## 2022-03-20 DIAGNOSIS — N952 Postmenopausal atrophic vaginitis: Secondary | ICD-10-CM | POA: Diagnosis not present

## 2022-03-20 DIAGNOSIS — N941 Unspecified dyspareunia: Secondary | ICD-10-CM | POA: Diagnosis not present

## 2022-03-20 DIAGNOSIS — N393 Stress incontinence (female) (male): Secondary | ICD-10-CM | POA: Diagnosis not present

## 2022-03-20 DIAGNOSIS — L900 Lichen sclerosus et atrophicus: Secondary | ICD-10-CM | POA: Diagnosis not present

## 2022-03-22 ENCOUNTER — Other Ambulatory Visit: Payer: Self-pay | Admitting: Family Medicine

## 2022-03-22 DIAGNOSIS — E1169 Type 2 diabetes mellitus with other specified complication: Secondary | ICD-10-CM

## 2022-03-25 ENCOUNTER — Other Ambulatory Visit: Payer: Self-pay | Admitting: Family Medicine

## 2022-03-25 DIAGNOSIS — E1169 Type 2 diabetes mellitus with other specified complication: Secondary | ICD-10-CM

## 2022-03-27 ENCOUNTER — Ambulatory Visit: Payer: BC Managed Care – PPO | Admitting: Neurology

## 2022-03-29 ENCOUNTER — Other Ambulatory Visit: Payer: Self-pay | Admitting: Family Medicine

## 2022-05-07 DIAGNOSIS — R051 Acute cough: Secondary | ICD-10-CM | POA: Diagnosis not present

## 2022-05-07 DIAGNOSIS — H66002 Acute suppurative otitis media without spontaneous rupture of ear drum, left ear: Secondary | ICD-10-CM | POA: Diagnosis not present

## 2022-05-07 DIAGNOSIS — J069 Acute upper respiratory infection, unspecified: Secondary | ICD-10-CM | POA: Diagnosis not present

## 2022-05-11 ENCOUNTER — Other Ambulatory Visit: Payer: Self-pay | Admitting: Family Medicine

## 2022-05-11 ENCOUNTER — Encounter: Payer: Self-pay | Admitting: Family Medicine

## 2022-05-11 DIAGNOSIS — E1169 Type 2 diabetes mellitus with other specified complication: Secondary | ICD-10-CM

## 2022-05-11 NOTE — Telephone Encounter (Signed)
[facility] NTBS in Jan for 6 mos FU. Refill sent to pharmacy

## 2022-05-11 NOTE — Telephone Encounter (Signed)
LMTCB TO SCHEDULE APPT LETTER MAILED 

## 2022-06-17 ENCOUNTER — Other Ambulatory Visit: Payer: Self-pay | Admitting: Family Medicine

## 2022-06-17 DIAGNOSIS — G43809 Other migraine, not intractable, without status migrainosus: Secondary | ICD-10-CM

## 2022-06-26 ENCOUNTER — Other Ambulatory Visit: Payer: Self-pay | Admitting: Family Medicine

## 2022-06-26 DIAGNOSIS — G43809 Other migraine, not intractable, without status migrainosus: Secondary | ICD-10-CM

## 2022-07-04 ENCOUNTER — Other Ambulatory Visit: Payer: Self-pay | Admitting: Family Medicine

## 2022-07-04 DIAGNOSIS — F324 Major depressive disorder, single episode, in partial remission: Secondary | ICD-10-CM

## 2022-07-20 ENCOUNTER — Other Ambulatory Visit: Payer: Self-pay | Admitting: Family Medicine

## 2022-07-25 ENCOUNTER — Ambulatory Visit: Payer: BC Managed Care – PPO | Admitting: Obstetrics and Gynecology

## 2022-07-25 ENCOUNTER — Encounter: Payer: Self-pay | Admitting: Obstetrics and Gynecology

## 2022-07-25 VITALS — BP 121/81 | HR 87 | Ht 64.0 in | Wt 196.0 lb

## 2022-07-25 DIAGNOSIS — R350 Frequency of micturition: Secondary | ICD-10-CM | POA: Diagnosis not present

## 2022-07-25 DIAGNOSIS — N3281 Overactive bladder: Secondary | ICD-10-CM | POA: Diagnosis not present

## 2022-07-25 DIAGNOSIS — N816 Rectocele: Secondary | ICD-10-CM

## 2022-07-25 DIAGNOSIS — R351 Nocturia: Secondary | ICD-10-CM

## 2022-07-25 DIAGNOSIS — N393 Stress incontinence (female) (male): Secondary | ICD-10-CM

## 2022-07-25 LAB — POCT URINALYSIS DIPSTICK
Bilirubin, UA: NEGATIVE
Glucose, UA: POSITIVE — AB
Ketones, UA: NEGATIVE
Leukocytes, UA: NEGATIVE
Nitrite, UA: NEGATIVE
Protein, UA: NEGATIVE
Spec Grav, UA: 1.02 (ref 1.010–1.025)
Urobilinogen, UA: 0.2 E.U./dL
pH, UA: 5.5 (ref 5.0–8.0)

## 2022-07-25 MED ORDER — TROSPIUM CHLORIDE ER 60 MG PO CP24: 1.0000 | ORAL_CAPSULE | Freq: Every day | ORAL | 5 refills | Status: DC

## 2022-07-25 NOTE — Progress Notes (Signed)
[facility] New Patient Evaluation and Consultation  Referring Provider: [name], DO PCP: [name], DO Date of Service: 07/25/2022  SUBJECTIVE Chief Complaint: New Patient (Initial Visit) (Carrie [name] is a 62 y.o. female is here for urinary incontinence.)  History of Present Illness: Carrie [name] is a 62 y.o. White or Caucasian female seen in consultation at the request of Dr. Lajuana Ripple for evaluation of incontinence.    Review of records significant for: A1c:  12/07/21: 6.7 05/11/21: 5.9  Nocturia is main problem UUI>SUI  Urinary Symptoms: Leaks urine with cough/ sneeze, laughing, exercise, going from sitting to standing, with a full bladder, with movement to the bathroom, with urgency, and while asleep. SUI is not common.  Leaks 3 time(s) per days.  Pad use: None just changing clothes/underwear She is bothered by her UI symptoms.  Day time voids 8-10.  Nocturia: 2 times per night to void. Voiding dysfunction: she empties her bladder well.  does not use a catheter to empty bladder.  When urinating, she feels dribbling after finishing and the need to urinate multiple times in a row Drinks: 1 cup coffee in the morning, 5 bottles (16.9oz) of water daily, Sprite in evening 1 can per day, seldom alcohol   UTIs: 0 UTI's in the last year.   Denies history of blood in urine, kidney or bladder stones, pyelonephritis, bladder cancer, and kidney cancer  Pelvic Organ Prolapse Symptoms:                  She Denies a feeling of a bulge the vaginal area.  She Denies seeing a bulge.   Bowel Symptom: Bowel movements: 1 time(s) per day Stool consistency: hard Straining: yes.  Splinting: no.  Incomplete evacuation: yes.  She Denies accidental bowel leakage / fecal incontinence Bowel regimen: diet and fiber Last colonoscopy: Date 2023, Results 1 polyp  Sexual Function Sexually active: yes.  Sexual orientation: Straight Pain with sex: Yes, at the  vaginal opening, deep in the pelvis  Pelvic Pain Denies pelvic pain   Past Medical History:  Past Medical History:  Diagnosis Date   Depression    Diabetes mellitus without complication ([place])    Fever blister    Gastroparesis    Hyperlipidemia    Hypertension    Migraine      Past Surgical History:   Past Surgical History:  Procedure Laterality Date   BACK SURGERY     CHOLECYSTECTOMY  2004     Past OB/GYN History: G2 P2 Vaginal deliveries: 2,  Forceps/ Vacuum deliveries: Foprceps on first child, Cesarean section: 0 Menopausal: Yes, at age 36 Last pap smear was 2023.  Any history of abnormal pap smears: no.   Medications: She has a current medication list which includes the following prescription(s): aspirin ec, atorvastatin, cholecalciferol, insulin glargine-yfgn, relion pen needles, jardiance, metoclopramide, omeprazole, paroxetine, semaglutide (1 mg/dose), sumatriptan, telmisartan-hydrochlorothiazide, trospium chloride, and valacyclovir.   Allergies: Patient is allergic to metformin hcl.   Social History:  Social History   Tobacco Use   Smoking status: Former    Packs/day: 0.50    Years: 40.00    Total pack years: 20.00    Types: Cigarettes    Quit date: 2006    Years since quitting: 18.1   Smokeless tobacco: Never  Vaping Use   Vaping Use: Never used  Substance Use Topics   Alcohol use: Yes    Comment: occasional    Drug use: No    Relationship  status: married She lives with spouse .   She is employed Publishing copy. Regular exercise: Yes: walking History of abuse: No  Family History:   Family History  Problem Relation Age of Onset   Ovarian cancer Mother    Mental illness Daughter    Migraines Daughter    Colon cancer Neg Hx    Rectal cancer Neg Hx    Throat cancer Neg Hx      Review of Systems: Review of Systems  Constitutional:  Negative for chills, fever, malaise/fatigue and weight loss.  Respiratory:  Negative for cough,  shortness of breath and wheezing.   Cardiovascular:  Negative for chest pain, palpitations and leg swelling.  Gastrointestinal:  Negative for nausea and vomiting.  Genitourinary:  Positive for frequency and urgency.       +Vaginal d/c  Skin:  Negative for itching and rash.  Neurological:  Negative for dizziness and headaches.  Endo/Heme/Allergies:  Does not bruise/bleed easily.  Psychiatric/Behavioral:  Negative for depression and suicidal ideas. The patient is not nervous/anxious.      OBJECTIVE Physical Exam: Vitals:   07/25/22 0837  BP: 121/81  Pulse: 87  Weight: 196 lb (88.9 kg)  Height: 5' 4"$  (1.626 m)    Physical Exam Vitals and nursing note reviewed. Exam conducted with a chaperone present.  Constitutional:      Appearance: Normal appearance.  Pulmonary:     Effort: Pulmonary effort is normal.  Abdominal:     General: Abdomen is flat.     Palpations: Abdomen is soft.  Genitourinary:    Pubic Area: No rash.      Urethra: No prolapse or urethral swelling.     Vagina: No lesions.     Cervix: No discharge, lesion or cervical bleeding.     Adnexa:        Right: Tenderness present.        Left: Tenderness present.      Comments: +Vaginal atrophy Musculoskeletal:        General: No tenderness.  Skin:    General: Skin is warm.  Neurological:     General: No focal deficit present.     Mental Status: She is alert and oriented to person, place, and time.  Psychiatric:        Mood and Affect: Mood normal.        Behavior: Behavior normal.        Thought Content: Thought content normal.        Judgment: Judgment normal.      GU / Detailed Urogynecologic Evaluation:  Pelvic Exam: Normal external female genitalia; Bartholin's and Skene's glands normal in appearance; urethral meatus normal in appearance, no urethral masses or discharge.   CST: negative   Speculum exam reveals normal vaginal mucosa with atrophy. Cervix normal appearance. Uterus normal single,  nontender. Adnexa normal adnexa.    Pelvic floor strength III/V  Pelvic floor musculature: Right levator tender, Right obturator non-tender, Left levator tender, Left obturator non-tender  POP-Q:   POP-Q  -2                                            Aa   -2  Ba  -6                                              C   4                                            Gh  9                                            Pb  8.5                                            tvl   -1                                            Ap  -1                                            Bp  -7                                              D      Rectal Exam:  Normal sphincter tone, small distal rectocele, enterocoele not present, no rectal masses, no sign of dyssynergia when asking the patient to bear down.  Post-Void Residual (PVR) by Bladder Scan: In order to evaluate bladder emptying, we discussed obtaining a postvoid residual and she agreed to this procedure.  Procedure: The ultrasound unit was placed on the patient's abdomen in the suprapubic region after the patient had voided. A PVR of 54 ml was obtained by bladder scan.  Laboratory Results: POC urine: positive glucose, trace blood   ASSESSMENT AND PLAN Ms. Rowling is a 62 y.o. with:  1. Overactive bladder   2. Nocturia   3. Urinary frequency   4. SUI (stress urinary incontinence, female)   5. Posterior vaginal wall prolapse    OAB/ Nocturia - We discussed the symptoms of overactive bladder (OAB), which include urinary urgency, urinary frequency, nocturia, with or without urge incontinence.  While we do not know the exact etiology of OAB, several treatment options exist. We discussed management including behavioral therapy (decreasing bladder irritants, urge suppression strategies, timed voids, bladder retraining), physical therapy, medication; for refractory cases posterior tibial nerve  stimulation, sacral neuromodulation, and intravesical botulinum toxin injection.  - Will start Trospium 60m ER daily. We discussed it can take a few weeks to work so to give it time. For anticholinergic medications, we discussed the potential side effects of anticholinergics including dry eyes, dry mouth, constipation, cognitive impairment and urinary retention. - Discussed decreasing the amount of fluid she is drinking, as she is currently drinking 90oz  or more daily. She should try to cut back some and avoid drinking 2-3 hours prior to bedtime.  - Wishes to start doing some home exercises for pelvic floor. Information discussed about pelvic floor PT, but for now she wishes to do home workouts. Handouts given.   2. SUI - she is not as bothered by this symptom. Will work on pelvic floor strengthening at home.   3. Stage I anterior, Stage II posterior, Stage I apical prolapse - Not bothersome at this time, will expectantly manage.   Patient to follow up in 6 weeks for medication follow up or sooner if needed.    Jaquita Folds, MD

## 2022-07-25 NOTE — Patient Instructions (Addendum)
  We discussed the symptoms of overactive bladder (OAB), which include urinary urgency, urinary frequency, night-time urination, with or without urge incontinence.  We discussed management including behavioral therapy (decreasing bladder irritants by following a bladder diet, urge suppression strategies, timed voids, bladder retraining), physical therapy, medication; and for refractory cases posterior tibial nerve stimulation, sacral neuromodulation, and intravesical botulinum toxin injection.   For anticholinergic medications, we discussed the potential side effects of anticholinergics including dry eyes, dry mouth, constipation, rare risks of cognitive impairment and urinary retention. You were given Rx for Trospium 74m.  It can take a month to start working so give it time, but if you have bothersome side effects call sooner and we can try a different medication.  Call [place] you have trouble filling the prescription or if it's not covered by your insurance.

## 2022-07-26 ENCOUNTER — Other Ambulatory Visit: Payer: Self-pay | Admitting: Family Medicine

## 2022-07-26 DIAGNOSIS — E785 Hyperlipidemia, unspecified: Secondary | ICD-10-CM

## 2022-08-08 ENCOUNTER — Other Ambulatory Visit: Payer: Self-pay | Admitting: Family Medicine

## 2022-08-08 DIAGNOSIS — E1169 Type 2 diabetes mellitus with other specified complication: Secondary | ICD-10-CM

## 2022-08-09 ENCOUNTER — Other Ambulatory Visit: Payer: Self-pay | Admitting: Family Medicine

## 2022-08-22 ENCOUNTER — Encounter: Payer: Self-pay | Admitting: Family Medicine

## 2022-08-22 ENCOUNTER — Ambulatory Visit: Payer: BC Managed Care – PPO | Admitting: Family Medicine

## 2022-08-22 VITALS — BP 112/72 | HR 87 | Temp 98.4°F | Ht 64.0 in | Wt 195.0 lb

## 2022-08-22 DIAGNOSIS — E1159 Type 2 diabetes mellitus with other circulatory complications: Secondary | ICD-10-CM

## 2022-08-22 DIAGNOSIS — I152 Hypertension secondary to endocrine disorders: Secondary | ICD-10-CM

## 2022-08-22 DIAGNOSIS — G43809 Other migraine, not intractable, without status migrainosus: Secondary | ICD-10-CM | POA: Diagnosis not present

## 2022-08-22 DIAGNOSIS — F331 Major depressive disorder, recurrent, moderate: Secondary | ICD-10-CM

## 2022-08-22 DIAGNOSIS — E1169 Type 2 diabetes mellitus with other specified complication: Secondary | ICD-10-CM

## 2022-08-22 DIAGNOSIS — E785 Hyperlipidemia, unspecified: Secondary | ICD-10-CM

## 2022-08-22 DIAGNOSIS — Z6833 Body mass index (BMI) 33.0-33.9, adult: Secondary | ICD-10-CM

## 2022-08-22 LAB — BAYER DCA HB A1C WAIVED: HB A1C (BAYER DCA - WAIVED): 7.2 % — ABNORMAL HIGH (ref 4.8–5.6)

## 2022-08-22 MED ORDER — TELMISARTAN-HCTZ 40-12.5 MG PO TABS: ORAL_TABLET | ORAL | 3 refills | Status: DC

## 2022-08-22 MED ORDER — ATORVASTATIN CALCIUM 10 MG PO TABS: 10.0000 mg | ORAL_TABLET | Freq: Every day | ORAL | 3 refills | Status: DC

## 2022-08-22 MED ORDER — SEMAGLUTIDE (1 MG/DOSE) 4 MG/3ML ~~LOC~~ SOPN: 1.0000 mg | PEN_INJECTOR | SUBCUTANEOUS | 4 refills | Status: DC

## 2022-08-22 MED ORDER — DESVENLAFAXINE SUCCINATE ER 50 MG PO TB24: 50.0000 mg | ORAL_TABLET | Freq: Every day | ORAL | 0 refills | Status: DC

## 2022-08-22 MED ORDER — EMPAGLIFLOZIN 25 MG PO TABS: ORAL_TABLET | ORAL | 3 refills | Status: DC

## 2022-08-22 MED ORDER — INSULIN GLARGINE-YFGN 100 UNIT/ML ~~LOC~~ SOPN: PEN_INJECTOR | SUBCUTANEOUS | 3 refills | Status: DC

## 2022-08-22 MED ORDER — SUMATRIPTAN SUCCINATE 100 MG PO TABS: ORAL_TABLET | ORAL | 3 refills | Status: AC

## 2022-08-22 MED ORDER — FREESTYLE LIBRE 3 SENSOR MISC: 4 refills | Status: DC

## 2022-08-22 NOTE — Progress Notes (Signed)
Subjective: CC:Dm PCP: Carrie Norlander, DO Carrie [name] Carrie [name] is a 62 y.o. female presenting to clinic today for:  1. Type 2 Diabetes with hypertension, hyperlipidemia:  She reports compliance with her Jardiance, Ozempic and Semglee.  No hypoglycemic episodes reported.  Interested in CGM.  Compliant with antihypertensives and cholesterol medication.  Last eye exam: Needs Last foot exam: Needs Last A1c:  Lab Results  Component Value Date   HGBA1C 6.7 (H) 12/27/2021   Nephropathy screen indicated?:  Needs Last flu, zoster and/or pneumovax:  Immunization History  Administered Date(s) Administered   Influenza,inj,Quad PF,6+ Mos 07/07/2016, 03/28/2017, 04/02/2018, 04/07/2019, 05/11/2021   Influenza-Unspecified 04/17/2016   Moderna Sars-Covid-2 Vaccination 08/11/2019, 09/09/2019, 06/11/2020   PNEUMOCOCCAL CONJUGATE-20 05/11/2021   Pneumococcal Polysaccharide-23 01/23/2019   Tdap 01/23/2019   Zoster Recombinat (Shingrix) 11/16/2020, 06/02/2021    ROS: No chest pain, shortness of breath or edema reported  2.  Depressive disorder Since her neurologic event last year she has really never recovered from an emotional standpoint.  Her Paxil, which was previously efficacious, has not been very effective.  She reports just not feeling herself and having lost interest in things that normally bring her happiness.  Would like to try going on to something else  3.  Migraine headaches She reports roughly 1 migraine headache per month.  Uses Imitrex sparingly but does need refills    ROS: Per HPI  Allergies  Allergen Reactions   Metformin Hcl     Other Reaction(s): upset stomach   Past Medical History:  Diagnosis Date   Depression    Diabetes mellitus without complication (HCC)    Fever blister    Gastroparesis    Hyperlipidemia    Hypertension    Migraine     Current Outpatient Medications:    aspirin EC 81 MG tablet, Take 81 mg by mouth daily. Swallow whole., Disp: , Rfl:     atorvastatin (LIPITOR) 10 MG tablet, Take 1 tablet by mouth once daily, Disp: 90 tablet, Rfl: 0   cholecalciferol (VITAMIN D3) 25 MCG (1000 UNIT) tablet, Take 1,000 Units by mouth daily., Disp: , Rfl:    Insulin Pen Needle (RELION PEN NEEDLES) 32G X 4 MM MISC, USE TO INJECT MEDICATION ONCE DAILY Dx 10.9, Disp: 100 each, Rfl: 3   JARDIANCE 25 MG TABS tablet, TAKE 1 TABLET BY MOUTH BEFORE BREAKFAST, Disp: 90 tablet, Rfl: 0   metoCLOPramide (REGLAN) 5 MG tablet, TAKE 1 TABLET BY MOUTH THREE TIMES DAILY BEFORE MEAL(S), Disp: 90 tablet, Rfl: 3   omeprazole (PRILOSEC) 20 MG capsule, Take 1 capsule (20 mg total) by mouth daily as needed. TAKE 1 CAPSULE BY MOUTH ONCE DAILY 30 TO 60 MINUTES BEFORE BREAKFAST FOR 4 WEEKS THEN TAKE AS NEEDED AFTER 4 WEEKS, Disp: 30 capsule, Rfl: 2   PARoxetine (PAXIL) 30 MG tablet, Take 1 tablet (30 mg total) by mouth daily. (NEEDS TO BE SEEN BEFORE NEXT REFILL), Disp: 30 tablet, Rfl: 0   Semaglutide, 1 MG/DOSE, 4 MG/3ML SOPN, Inject 1 mg as directed once a week., Disp: 9 mL, Rfl: 4   SEMGLEE, YFGN, 100 UNIT/ML Pen, INJECT 70 UNITS ONCE DAILY . APPOINTMENT REQUIRED FOR FUTURE REFILLS, Disp: 15 mL, Rfl: 0   SUMAtriptan (IMITREX) 100 MG tablet, TAKE 1 TABLET BY MOUTH EVERY 2 HOURS AS NEEDED FOR MIGRAINE HEADACHE, Disp: 10 tablet, Rfl: 3   telmisartan-hydrochlorothiazide (MICARDIS HCT) 40-12.5 MG tablet, Take 1/2 (one-half) tablet by mouth once daily, Disp: 45 tablet, Rfl: 0   Trospium Chloride  60 MG CP24, Take 1 capsule (60 mg total) by mouth daily., Disp: 30 capsule, Rfl: 5   valACYclovir (VALTREX) 1000 MG tablet, Take 1,000 mg by mouth 2 (two) times daily., Disp: , Rfl:  Social History   Socioeconomic History   Marital status: Married    Spouse name: Not on file   Number of children: 2   Years of education: Not on file   Highest education level: Not on file  Occupational History   Occupation: customer service  Tobacco Use   Smoking status: Former    Packs/day: 0.50     Years: 40.00    Additional pack years: 0.00    Total pack years: 20.00    Types: Cigarettes    Quit date: 2006    Years since quitting: 18.2   Smokeless tobacco: Never  Vaping Use   Vaping Use: Never used  Substance and Sexual Activity   Alcohol use: Yes    Comment: occasional    Drug use: No   Sexual activity: Yes    Birth control/protection: Post-menopausal  Other Topics Concern   Not on file  Social History Narrative   .      Right handed    Lives with husband    Social Determinants of Health   Financial Resource Strain: Not on file  Food Insecurity: Not on file  Transportation Needs: Not on file  Physical Activity: Not on file  Stress: Not on file  Social Connections: Not on file  Intimate Partner Violence: Not on file   Family History  Problem Relation Age of Onset   Ovarian cancer Mother    Mental illness Daughter    Migraines Daughter    Colon cancer Neg Hx    Rectal cancer Neg Hx    Throat cancer Neg Hx     Objective: Office vital signs reviewed. BP 112/72   Pulse 87   Temp 98.4 F (36.9 C)   Ht 5\' 4"  (1.626 m)   Wt 195 lb (88.5 kg)   SpO2 94%   BMI 33.47 kg/m   Physical Examination:  General: Awake, alert, well nourished, No acute distress HEENT: sclera white, MMM Cardio: regular rate and rhythm, S1S2 heard, no murmurs appreciated Pulm: clear to auscultation bilaterally, no wheezes, rhonchi or rales; normal work of breathing on room air Psych: Mood depressed.  Patient very pleasant, interactive     08/22/2022    4:00 PM 12/27/2021    8:13 AM 08/03/2021   10:30 AM  Depression screen PHQ 2/9  Decreased Interest 1 0 0  Down, Depressed, Hopeless 1 0 0  PHQ - 2 Score 2 0 0  Altered sleeping 1 0 0  Tired, decreased energy 1 3 1   Change in appetite 0 2 1  Feeling bad or failure about yourself  1 0 0  Trouble concentrating 2 3 0  Moving slowly or fidgety/restless 0 0 0  Suicidal thoughts 0 0 0  PHQ-9 Score 7 8 2   Difficult doing  work/chores Extremely dIfficult Not difficult at all Somewhat difficult      08/22/2022    4:00 PM 12/27/2021    8:14 AM 08/03/2021   10:31 AM 05/11/2021    8:57 AM  GAD 7 : Generalized Anxiety Score  Nervous, Anxious, on Edge 1 0 0 0  Control/stop worrying 1 0 0 0  Worry too much - different things 1 0 0 0  Trouble relaxing 0 0 0 0  Restless 0 0 0 0  Easily annoyed or irritable 0 0 0 0  Afraid - awful might happen 0 0 0 0  Total GAD 7 Score 3 0 0 0  Anxiety Difficulty Somewhat difficult Not difficult at all Not difficult at all Not difficult at all    Diabetic Foot Exam - Simple   Simple Foot Form Diabetic Foot exam was performed with the following findings: Yes 08/22/2022  5:00 PM  Visual Inspection No deformities, no ulcerations, no other skin breakdown bilaterally: Yes Sensation Testing See comments: Yes Pulse Check Posterior Tibialis and Dorsalis pulse intact bilaterally: Yes Comments With the exception of her left great toe monofilament sensation totally intact      Assessment/ Plan: 62 y.o. female   Type 2 diabetes mellitus with other specified complication, without long-term current use of insulin (HCC) - Plan: Bayer DCA Hb A1c Waived, Microalbumin / creatinine urine ratio, CMP14+EGFR, Continuous Blood Gluc Sensor (FREESTYLE LIBRE 3 SENSOR) MISC, empagliflozin (JARDIANCE) 25 MG TABS tablet, Semaglutide, 1 MG/DOSE, 4 MG/3ML SOPN  Hyperlipidemia associated with type 2 diabetes mellitus ([place]) - Plan: CMP14+EGFR, LDL Cholesterol, Direct, atorvastatin (LIPITOR) 10 MG tablet  Hypertension associated with diabetes ([place]) - Plan: CMP14+EGFR, telmisartan-hydrochlorothiazide (MICARDIS HCT) 40-12.5 MG tablet  Morbid obesity (HCC) - Plan: CMP14+EGFR, LDL Cholesterol, Direct  Other migraine without status migrainosus, not intractable - Plan: SUMAtriptan (IMITREX) 100 MG tablet  Moderate episode of recurrent major depressive disorder ([place]) - Plan: desvenlafaxine (PRISTIQ) 50 MG 24  hr tablet  Sugar not at goal with A1c at 7.2 today.  She will continue current medication regimen but I am going to place her on a freestyle libre to better monitor her blood sugars.  Hopefully we will see a reduction in her sugar alone without additional medications.  We will see her back again in 3 months for recheck  Continue statin.  Check direct LDL as she is nonfasting.  Check liver enzymes and electrolytes  Blood pressure under good control with half dose of micardis  Continue Imitrex.  Having 1 migraine per month so no prevention at this time  I am going to have her taper Paxil to 15 mg daily for 3 weeks then may stop and transition over to Pristiq 50 mg daily  41-month follow-up recommended for A1c, mood and to cut off skin lesion of concern today    Orders Placed This Encounter  Procedures   Bayer DCA Hb A1c Waived   Microalbumin / creatinine urine ratio   CMP14+EGFR   LDL Cholesterol, Direct   No orders of the defined types were placed in this encounter.    Carrie Norlander, DO [place] [phone]

## 2022-08-23 LAB — CMP14+EGFR
ALT: 19 IU/L (ref 0–32)
AST: 18 IU/L (ref 0–40)
Albumin/Globulin Ratio: 1.8 (ref 1.2–2.2)
Albumin: 4.8 g/dL (ref 3.9–4.9)
Alkaline Phosphatase: 99 IU/L (ref 44–121)
BUN/Creatinine Ratio: 26 (ref 12–28)
BUN: 19 mg/dL (ref 8–27)
Bilirubin Total: 0.6 mg/dL (ref 0.0–1.2)
CO2: 22 mmol/L (ref 20–29)
Calcium: 9.7 mg/dL (ref 8.7–10.3)
Chloride: 98 mmol/L (ref 96–106)
Creatinine, Ser: 0.72 mg/dL (ref 0.57–1.00)
Globulin, Total: 2.6 g/dL (ref 1.5–4.5)
Glucose: 99 mg/dL (ref 70–99)
Potassium: 3.9 mmol/L (ref 3.5–5.2)
Sodium: 138 mmol/L (ref 134–144)
Total Protein: 7.4 g/dL (ref 6.0–8.5)
eGFR: 94 mL/min/{1.73_m2} (ref >59)

## 2022-08-23 LAB — MICROALBUMIN / CREATININE URINE RATIO
Creatinine, Urine: 40.8 mg/dL
Microalb/Creat Ratio: <7 mg/g creat (ref 0–29)
Microalbumin, Urine: <3 ug/mL

## 2022-08-23 LAB — LDL CHOLESTEROL, DIRECT: LDL Direct: 74 mg/dL (ref 0–99)

## 2022-09-05 ENCOUNTER — Encounter: Payer: Self-pay | Admitting: Obstetrics and Gynecology

## 2022-09-05 ENCOUNTER — Ambulatory Visit: Payer: BC Managed Care – PPO | Admitting: Obstetrics and Gynecology

## 2022-09-05 VITALS — BP 120/77 | HR 76

## 2022-09-05 DIAGNOSIS — N898 Other specified noninflammatory disorders of vagina: Secondary | ICD-10-CM

## 2022-09-05 DIAGNOSIS — N3281 Overactive bladder: Secondary | ICD-10-CM | POA: Diagnosis not present

## 2022-09-05 DIAGNOSIS — R351 Nocturia: Secondary | ICD-10-CM

## 2022-09-05 NOTE — Patient Instructions (Addendum)
You can use coconut oil at night for vaginal dryness.   Continue Trospium 60mg  ER daily

## 2022-09-05 NOTE — Progress Notes (Signed)
[facility] Return Visit  SUBJECTIVE  History of Present Illness: Carrie [name] is a 62 y.o. female seen in follow-up for OAB. Plan at last visit was start Trospium 60mg  ER daily.   Has been drinking just enough to take medication at night. Has decreased some of her daily water consumption.   Denies UTI symptoms. Denies medication side effects.   Reports she is overall doing well. Reports she is getting up about once per night which is an improvement for her.   Endorses some vaginal dryness and itching.    Past Medical History: Patient  has a past medical history of Depression, Diabetes mellitus without complication, Fever blister, Gastroparesis, Hyperlipidemia, Hypertension, and Migraine.   Past Surgical History: She  has a past surgical history that includes Back surgery and Cholecystectomy (2004).   Medications: She has a current medication list which includes the following prescription(s): aspirin ec, atorvastatin, cholecalciferol, freestyle libre 3 sensor, desvenlafaxine, empagliflozin, insulin glargine-yfgn, relion pen needles, metoclopramide, omeprazole, semaglutide (1 mg/dose), sumatriptan, telmisartan-hydrochlorothiazide, trospium chloride, valacyclovir, and yuvafem.   Allergies: Patient is allergic to metformin hcl.   Social History: Patient  reports that she quit smoking about 18 years ago. Her smoking use included cigarettes. She has a 20.00 pack-year smoking history. She has never used smokeless tobacco. She reports current alcohol use. She reports that she does not use drugs.      OBJECTIVE     Physical Exam: Vitals:   09/05/22 0842  BP: 120/77  Pulse: 76   Gen: No apparent distress, A&O x 3.  Detailed Urogynecologic Evaluation:  Deferred. Prior exam showed:    ASSESSMENT AND PLAN    Carrie [name] is a 62 y.o. with:  1. Overactive bladder   2. Nocturia   3. Vaginal dryness    Patient to continue on Trospium 60mg  ER daily and continue to  maintain her water reduction methods.  Continue to only do sips of water 2-3 hours before bed with medications.  Patient reports she has vaginal dryness and itching at times.Reports she is currently on Yuvafem but still has some symptoms. Encouraged to use coconut oil in the vagina for lubrication and moisture at night time when not using the Yuvafem.   Patient to follow up in 6 months or sooner if needed.

## 2022-09-08 ENCOUNTER — Other Ambulatory Visit: Payer: Self-pay | Admitting: Family Medicine

## 2022-09-11 ENCOUNTER — Encounter: Payer: Self-pay | Admitting: Family Medicine

## 2022-09-11 DIAGNOSIS — E1169 Type 2 diabetes mellitus with other specified complication: Secondary | ICD-10-CM

## 2022-09-11 MED ORDER — INSULIN GLARGINE-YFGN 100 UNIT/ML ~~LOC~~ SOPN: 70.0000 [IU] | PEN_INJECTOR | Freq: Every day | SUBCUTANEOUS | 4 refills | Status: DC

## 2022-09-15 DIAGNOSIS — R350 Frequency of micturition: Secondary | ICD-10-CM | POA: Diagnosis not present

## 2022-09-15 DIAGNOSIS — B3731 Acute candidiasis of vulva and vagina: Secondary | ICD-10-CM | POA: Diagnosis not present

## 2022-09-15 DIAGNOSIS — N898 Other specified noninflammatory disorders of vagina: Secondary | ICD-10-CM | POA: Diagnosis not present

## 2022-09-15 DIAGNOSIS — R300 Dysuria: Secondary | ICD-10-CM | POA: Diagnosis not present

## 2022-09-26 ENCOUNTER — Other Ambulatory Visit: Payer: Self-pay | Admitting: Family Medicine

## 2022-09-26 DIAGNOSIS — E1169 Type 2 diabetes mellitus with other specified complication: Secondary | ICD-10-CM

## 2022-10-09 ENCOUNTER — Other Ambulatory Visit: Payer: Self-pay | Admitting: Family Medicine

## 2022-10-09 DIAGNOSIS — B9689 Other specified bacterial agents as the cause of diseases classified elsewhere: Secondary | ICD-10-CM

## 2022-10-09 DIAGNOSIS — Z8616 Personal history of COVID-19: Secondary | ICD-10-CM

## 2022-10-12 DIAGNOSIS — N952 Postmenopausal atrophic vaginitis: Secondary | ICD-10-CM | POA: Diagnosis not present

## 2022-10-12 DIAGNOSIS — Z1231 Encounter for screening mammogram for malignant neoplasm of breast: Secondary | ICD-10-CM | POA: Diagnosis not present

## 2022-10-12 DIAGNOSIS — Z01419 Encounter for gynecological examination (general) (routine) without abnormal findings: Secondary | ICD-10-CM | POA: Diagnosis not present

## 2022-10-12 DIAGNOSIS — Z1389 Encounter for screening for other disorder: Secondary | ICD-10-CM | POA: Diagnosis not present

## 2022-10-12 DIAGNOSIS — Z130 Encounter for screening for diseases of the blood and blood-forming organs and certain disorders involving the immune mechanism: Secondary | ICD-10-CM | POA: Diagnosis not present

## 2022-10-13 LAB — HM MAMMOGRAPHY

## 2022-11-09 ENCOUNTER — Other Ambulatory Visit: Payer: Self-pay | Admitting: Gastroenterology

## 2022-11-12 ENCOUNTER — Other Ambulatory Visit: Payer: Self-pay | Admitting: Family Medicine

## 2022-11-22 ENCOUNTER — Ambulatory Visit: Payer: BC Managed Care – PPO | Admitting: Family Medicine

## 2022-11-22 ENCOUNTER — Encounter: Payer: Self-pay | Admitting: Family Medicine

## 2022-11-22 VITALS — BP 124/77 | HR 84 | Temp 98.5°F | Ht 64.0 in | Wt 195.0 lb

## 2022-11-22 DIAGNOSIS — E785 Hyperlipidemia, unspecified: Secondary | ICD-10-CM

## 2022-11-22 DIAGNOSIS — L918 Other hypertrophic disorders of the skin: Secondary | ICD-10-CM

## 2022-11-22 DIAGNOSIS — E1169 Type 2 diabetes mellitus with other specified complication: Secondary | ICD-10-CM

## 2022-11-22 DIAGNOSIS — E1159 Type 2 diabetes mellitus with other circulatory complications: Secondary | ICD-10-CM | POA: Diagnosis not present

## 2022-11-22 DIAGNOSIS — F331 Major depressive disorder, recurrent, moderate: Secondary | ICD-10-CM | POA: Diagnosis not present

## 2022-11-22 DIAGNOSIS — I152 Hypertension secondary to endocrine disorders: Secondary | ICD-10-CM

## 2022-11-22 LAB — BAYER DCA HB A1C WAIVED: HB A1C (BAYER DCA - WAIVED): 7.1 % — ABNORMAL HIGH (ref 4.8–5.6)

## 2022-11-22 MED ORDER — DESVENLAFAXINE SUCCINATE ER 100 MG PO TB24
100.0000 mg | ORAL_TABLET | Freq: Every day | ORAL | 3 refills | Status: AC
Start: 2022-11-22 — End: ?

## 2022-11-22 MED ORDER — INSULIN GLARGINE-YFGN 100 UNIT/ML ~~LOC~~ SOPN: PEN_INJECTOR | SUBCUTANEOUS | 3 refills | Status: DC

## 2022-11-22 NOTE — Progress Notes (Addendum)
Subjective: CC:Dm PCP: Carrie Ip, DO UJW:JXBJ SHARAIN [name] is a 62 y.o. female presenting to clinic today for:  1. Type 2 Diabetes with hypertension, hyperlipidemia:  Patient reports compliance with her medications.  She is taking Lipitor 10 mg daily, Jardiance 25 mg daily, Semglee 70 units subcutaneous daily, Ozempic 1 mg q. 7 days, Micardis half a tablet of the 40-12.5 mg every other day.  She denies any chest pain, shortness of breath, vaginal symptoms.  No dizziness, lower extremity edema reported  Last eye exam: Needs new provider Last foot exam: Up-to-date Last A1c:  Lab Results  Component Value Date   HGBA1C 7.2 (H) 08/22/2022   Nephropathy screen indicated?:  Up-to-date Last flu, zoster and/or pneumovax:  Immunization History  Administered Date(s) Administered   Influenza,inj,Quad PF,6+ Mos 07/07/2016, 03/28/2017, 04/02/2018, 04/07/2019, 05/11/2021   Influenza-Unspecified 04/17/2016   Moderna Sars-Covid-2 Vaccination 08/11/2019, 09/09/2019, 06/11/2020   PNEUMOCOCCAL CONJUGATE-20 05/11/2021   Pneumococcal Polysaccharide-23 01/23/2019   Tdap 01/23/2019   Zoster Recombinat (Shingrix) 11/16/2020, 06/02/2021    2.  Depressive disorder associate with anxiety Patient reports that symptoms have gotten slightly better with transition from Carrie [name] to 50 mg Pristiq but she still has symptoms that are outside of what she hopes to be her baseline.  She is not quite ready to see a therapist or psychiatrist but may be amenable to this if she does not find substantial improvement with increased dose.  3.  Skin tag Patient reports a skin tag on the left back that has been hurting.  It gets easily caught on her clothing and bleeds.  ROS: Per HPI  Allergies  Allergen Reactions   Metformin Hcl     Other Reaction(s): upset stomach   Past Medical History:  Diagnosis Date   Depression    Diabetes mellitus without complication (HCC)    Fever blister    Gastroparesis     Hyperlipidemia    Hypertension    Migraine     Current Outpatient Medications:    aspirin EC 81 MG tablet, Take 81 mg by mouth daily. Swallow whole., Disp: , Rfl:    atorvastatin (LIPITOR) 10 MG tablet, Take 1 tablet (10 mg total) by mouth daily., Disp: 90 tablet, Rfl: 3   cholecalciferol (VITAMIN D3) 25 MCG (1000 UNIT) tablet, Take 1,000 Units by mouth daily., Disp: , Rfl:    Continuous Blood Gluc Sensor (FREESTYLE LIBRE 3 SENSOR) MISC, Place 1 sensor on the skin every 14 days. Use to check glucose continuously E11.69, Disp: 6 each, Rfl: 4   desvenlafaxine (PRISTIQ) 50 MG 24 hr tablet, Take 1 tablet (50 mg total) by mouth daily. Start after Carrie [name] taper completed., Disp: 90 tablet, Rfl: 0   empagliflozin (JARDIANCE) 25 MG TABS tablet, TAKE 1 TABLET BY MOUTH BEFORE BREAKFAST, Disp: 90 tablet, Rfl: 3   Insulin Pen Needle (RELION PEN NEEDLES) 32G X 4 MM MISC, USE TO INJECT MEDICATION ONCE DAILY Dx 10.9, Disp: 100 each, Rfl: 0   metoCLOPramide (REGLAN) 5 MG tablet, TAKE 1 TABLET BY MOUTH THREE TIMES DAILY BEFORE MEAL(S), Disp: 90 tablet, Rfl: 0   omeprazole (PRILOSEC) 20 MG capsule, Take 1 capsule (20 mg total) by mouth daily as needed. TAKE 1 CAPSULE BY MOUTH ONCE DAILY 30 TO 60 MINUTES BEFORE BREAKFAST FOR 4 WEEKS THEN TAKE AS NEEDED AFTER 4 WEEKS, Disp: 30 capsule, Rfl: 2   Semaglutide, 1 MG/DOSE, 4 MG/3ML SOPN, Inject 1 mg as directed once a week., Disp: 9 mL, Rfl: 4  SEMGLEE, YFGN, 100 UNIT/ML Pen, INJECT 70 UNITS SUBCUTANEOUSLY ONCE DAILY, Disp: 15 mL, Rfl: 0   SUMAtriptan (IMITREX) 100 MG tablet, May repeat in 2 hours if headache persists or recurs., Disp: 10 tablet, Rfl: 3   telmisartan-hydrochlorothiazide (MICARDIS HCT) 40-12.5 MG tablet, Take 1/2 (one-half) tablet by mouth once daily, Disp: 45 tablet, Rfl: 3   Trospium Chloride 60 MG CP24, Take 1 capsule (60 mg total) by mouth daily., Disp: 30 capsule, Rfl: 5   valACYclovir (VALTREX) 1000 MG tablet, Take 1,000 mg by mouth 2 (two) times  daily., Disp: , Rfl:    YUVAFEM 10 MCG TABS vaginal tablet, Place vaginally., Disp: , Rfl:  Social History   Socioeconomic History   Marital status: Married    Spouse name: Not on file   Number of children: 2   Years of education: Not on file   Highest education level: Not on file  Occupational History   Occupation: customer service  Tobacco Use   Smoking status: Former    Packs/day: 0.50    Years: 40.00    Additional pack years: 0.00    Total pack years: 20.00    Types: Cigarettes    Quit date: 2006    Years since quitting: 18.4   Smokeless tobacco: Never  Vaping Use   Vaping Use: Never used  Substance and Sexual Activity   Alcohol use: Yes    Comment: occasional    Drug use: No   Sexual activity: Yes    Birth control/protection: Post-menopausal  Other Topics Concern   Not on file  Social History Narrative   .      Right handed    Lives with husband    Social Determinants of Health   Financial Resource Strain: Not on file  Food Insecurity: Not on file  Transportation Needs: Not on file  Physical Activity: Not on file  Stress: Not on file  Social Connections: Not on file  Intimate Partner Violence: Not on file   Family History  Problem Relation Age of Onset   Ovarian cancer Mother    Mental illness Daughter    Migraines Daughter    Colon cancer Neg Hx    Rectal cancer Neg Hx    Throat cancer Neg Hx     Objective: Office vital signs reviewed. BP 124/77   Pulse 84   Temp 98.5 F (36.9 C)   Ht 5\' 4"  (1.626 m)   Wt 195 lb (88.5 kg)   SpO2 92%   BMI 33.47 kg/m   Physical Examination:  General: Awake, alert, well nourished, No acute distress HEENT: sclera white, MMM Cardio: regular rate and rhythm, S1S2 heard, no murmurs appreciated Pulm: clear to auscultation bilaterally, no wheezes, rhonchi or rales; normal work of breathing on room air Psych: Somewhat anxious appearing.  Very pleasant, interactive    08/22/2022    4:00 PM 12/27/2021    8:13 AM  08/03/2021   10:30 AM  Depression screen PHQ 2/9  Decreased Interest 1 0 0  Down, Depressed, Hopeless 1 0 0  PHQ - 2 Score 2 0 0  Altered sleeping 1 0 0  Tired, decreased energy 1 3 1   Change in appetite 0 2 1  Feeling bad or failure about yourself  1 0 0  Trouble concentrating 2 3 0  Moving slowly or fidgety/restless 0 0 0  Suicidal thoughts 0 0 0  PHQ-9 Score 7 8 2   Difficult doing work/chores Extremely dIfficult Not difficult at all Somewhat difficult  08/22/2022    4:00 PM 12/27/2021    8:14 AM 08/03/2021   10:31 AM 05/11/2021    8:57 AM  GAD 7 : Generalized Anxiety Score  Nervous, Anxious, on Edge 1 0 0 0  Control/stop worrying 1 0 0 0  Worry too much - different things 1 0 0 0  Trouble relaxing 0 0 0 0  Restless 0 0 0 0  Easily annoyed or irritable 0 0 0 0  Afraid - awful might happen 0 0 0 0  Total GAD 7 Score 3 0 0 0  Anxiety Difficulty Somewhat difficult Not difficult at all Not difficult at all Not difficult at all   Skin: Inflamed, slightly torn skin tag noted along the left lower mid back.  There is no active bleeding or exudates but there is some mild erythema associated  Cryotherapy Procedure:  Risks and benefits of procedure were reviewed with the patient.  Written consent obtained and scanned into the chart.  Lesion of concern was identified and located on left mid/lower back.  Liquid nitrogen was applied to area of concern and extending out 1 millimeters beyond the border of the lesion.  Treated area was allowed to come back to room temperature before treating it a second time.  Patient tolerated procedure well and there were no immediate complications.  Home care instructions were reviewed with the patient and a handout was provided.  Assessment/ Plan: 62 y.o. female   Type 2 diabetes mellitus with other specified complication, without long-term current use of insulin (HCC) - Plan: Bayer DCA Hb A1c Waived, insulin glargine-yfgn (SEMGLEE, YFGN,) 100 UNIT/ML  Pen, Semaglutide, 2 MG/DOSE, 8 MG/3ML SOPN  Hypertension associated with diabetes (HCC)  Hyperlipidemia associated with type 2 diabetes mellitus (HCC)  Moderate episode of recurrent major depressive disorder (HCC) - Plan: desvenlafaxine (PRISTIQ) 100 MG 24 hr tablet  Inflamed skin tag  Check A1c.  Plan to follow-up in 4 to 6 months, sooner if concerns arise.  Discussed establishing care with new ophthalmologist for retinal screening.  I offered retinal screening here in office but she declined for now  Blood pressure is controlled with current regimen.  No changes  Continue statin.  Advance Pristiq to 100 mg daily.  Discussed integrated behavioral health available here in office.  She will contemplate this  Inflamed skin tag was treated with cryoablation today.  We discussed potential need for blade excision if symptoms are not improved with this method of treatment   No orders of the defined types were placed in this encounter.  No orders of the defined types were placed in this encounter.    Carrie Ip, DO [facility] [phone]

## 2022-11-24 MED ORDER — SEMAGLUTIDE (2 MG/DOSE) 8 MG/3ML ~~LOC~~ SOPN
2.0000 mg | PEN_INJECTOR | SUBCUTANEOUS | 3 refills | Status: DC
Start: 2022-11-24 — End: 2023-10-24

## 2022-11-24 NOTE — Addendum Note (Signed)
Addended by: Raliegh Ip on: 11/24/2022 01:48 PM   Modules accepted: Orders

## 2022-12-08 ENCOUNTER — Other Ambulatory Visit: Payer: Self-pay | Admitting: Gastroenterology

## 2022-12-31 ENCOUNTER — Other Ambulatory Visit: Payer: Self-pay | Admitting: Gastroenterology

## 2023-01-02 ENCOUNTER — Other Ambulatory Visit: Payer: Self-pay | Admitting: Gastroenterology

## 2023-01-04 ENCOUNTER — Telehealth: Payer: Self-pay | Admitting: Gastroenterology

## 2023-01-04 MED ORDER — METOCLOPRAMIDE HCL 5 MG PO TABS: 5.0000 mg | ORAL_TABLET | Freq: Three times a day (TID) | ORAL | 1 refills | Status: DC

## 2023-01-04 NOTE — Telephone Encounter (Signed)
Yes if this is working for her can refill it until she can get to her clinic appointment.  Thanks

## 2023-01-04 NOTE — Telephone Encounter (Signed)
Inbound call from patient requesting a refill for metoclopramide. Is scheduled for 10/17. Please advise, thank you.

## 2023-01-21 ENCOUNTER — Other Ambulatory Visit: Payer: Self-pay | Admitting: Gastroenterology

## 2023-01-30 ENCOUNTER — Other Ambulatory Visit: Payer: Self-pay | Admitting: Obstetrics and Gynecology

## 2023-01-30 DIAGNOSIS — N3281 Overactive bladder: Secondary | ICD-10-CM

## 2023-01-30 DIAGNOSIS — R351 Nocturia: Secondary | ICD-10-CM

## 2023-02-09 DIAGNOSIS — H52223 Regular astigmatism, bilateral: Secondary | ICD-10-CM | POA: Diagnosis not present

## 2023-02-09 DIAGNOSIS — H524 Presbyopia: Secondary | ICD-10-CM | POA: Diagnosis not present

## 2023-02-09 DIAGNOSIS — E119 Type 2 diabetes mellitus without complications: Secondary | ICD-10-CM | POA: Diagnosis not present

## 2023-02-09 DIAGNOSIS — H2513 Age-related nuclear cataract, bilateral: Secondary | ICD-10-CM | POA: Diagnosis not present

## 2023-02-09 LAB — HM DIABETES EYE EXAM

## 2023-03-08 ENCOUNTER — Ambulatory Visit: Payer: BC Managed Care – PPO | Admitting: Obstetrics and Gynecology

## 2023-03-21 ENCOUNTER — Ambulatory Visit: Payer: BC Managed Care – PPO | Admitting: Obstetrics and Gynecology

## 2023-03-22 ENCOUNTER — Ambulatory Visit: Payer: BC Managed Care – PPO | Admitting: Nurse Practitioner

## 2023-03-22 ENCOUNTER — Encounter: Payer: Self-pay | Admitting: Nurse Practitioner

## 2023-03-22 VITALS — BP 90/70 | HR 100 | Ht 64.0 in | Wt 188.2 lb

## 2023-03-22 DIAGNOSIS — K3184 Gastroparesis: Secondary | ICD-10-CM | POA: Diagnosis not present

## 2023-03-22 MED ORDER — METOCLOPRAMIDE HCL 5 MG PO TABS: 5.0000 mg | ORAL_TABLET | Freq: Two times a day (BID) | ORAL | 6 refills | Status: AC

## 2023-03-22 NOTE — Progress Notes (Signed)
Agree with assessment plan as outlined.  Carrie [name] if her insurance covers it we may want to consider metoclopramide nasal spray (Gimoti) which should not have any risk for QT prolongation and there have been no reported cases of tardive dyskinesia that I am aware of.  This may be a safer long-term option for her if covered by insurance.  Otherwise as long as she understands the risks, she is on a low-dose and seems to be tolerating oral Reglan well, can continue it if she finds it really helps her.

## 2023-03-22 NOTE — Progress Notes (Signed)
ASSESSMENT    Brief Narrative:  62 y.o.  female known to Dr. Adela Lank with a past medical history not limited to diabetes, gastroparesis, depression  Gastroparesis  Documented by gastric emptying study and retained food in stomach on upper endoscopy.  She takes Ozempic which can worsen gastroparesis but fortunately symptoms are well-controlled on metoclopramide 5 mg twice daily.   See PMH below for additional history  PLAN   --Discussed at length the potential serious side effects of Metoclopramide such as uncontrolled spasms / movements of the face, neck, arms or legs. Advised to monitor for involuntary lip smacking, movements of the tongue, and abnormal movements of the eyes. If any of these potentially serious side effect occur, stop the medication immediately.  -- She might try to decrease Reglan to once daily before evening meal as her symptoms were previously most bothersome in the evening -- Recommend changing from 3 meals a day to small frequent meals to facilitate gastric emptying --Avoid high fat content foods -- She understands the importance of good glucose control -- Follow-up in 6 months  HPI   Chief complaint : Follow-up on gastroparesis  Carrie [name] has a history of gastroparesis.  She had retained food on upper endoscopy and delayed gastric emptying on gastric emptying study.  She has been on Ozempic for over a year.  She takes Reglan 5 mg twice daily before breakfast and dinner.  Before starting Reglan she was miserable with nausea and vomiting.  Symptoms caused her to miss work.  She has a history of GERD but does not require maintenance medication.  She rarely has to take omeprazole .   Carrie [name] has not noticed any side effects from metoclopramide.  She is eating 3 meals a day, not small frequent meals.  Fasting blood sugars around 120.   Carrie [name] is up-to-date on colonoscopy, last one was in June 2023      Latest Ref Rng & Units 08/22/2022    4:03 PM 07/29/2021    11:11 AM 05/11/2021    8:14 AM  Hepatic Function  Total Protein 6.0 - 8.5 g/dL 7.4  7.3  6.4   Albumin 3.9 - 4.9 g/dL 4.8  4.1  4.3   AST 0 - 40 IU/L 18  26  17    ALT 0 - 32 IU/L 19  24  19    Alk Phosphatase 44 - 121 IU/L 99  69  79   Total Bilirubin 0.0 - 1.2 mg/dL 0.6  1.2  0.6        Latest Ref Rng & Units 07/31/2021    5:20 AM 07/30/2021    6:58 AM 07/30/2021    4:42 AM  CBC  WBC 4.0 - 10.5 K/uL 10.3   11.4   Hemoglobin 12.0 - 15.0 g/dL 40.1   02.7   Hematocrit 36.0 - 46.0 % 36.9  35.8  37.3   Platelets 150 - 400 K/uL 345   333      Past Medical History:  Diagnosis Date   Depression    Diabetes mellitus without complication (HCC)    Fever blister    Gastroparesis    Hyperlipidemia    Hypertension    Migraine     Past Surgical History:  Procedure Laterality Date   BACK SURGERY     CHOLECYSTECTOMY  2004    Family History  Problem Relation Age of Onset   Ovarian cancer Mother    Mental illness Daughter    Migraines Daughter  Colon cancer Neg Hx    Rectal cancer Neg Hx    Throat cancer Neg Hx     Current Medications, Allergies, Family History and Social History were reviewed in [employer] record.     Current Outpatient Medications  Medication Sig Dispense Refill   aspirin EC 81 MG tablet Take 81 mg by mouth daily. Swallow whole.     atorvastatin (LIPITOR) 10 MG tablet Take 1 tablet (10 mg total) by mouth daily. 90 tablet 3   cholecalciferol (VITAMIN D3) 25 MCG (1000 UNIT) tablet Take 1,000 Units by mouth daily.     clobetasol ointment (TEMOVATE) 0.05 % Apply 1 Application topically as needed.     Continuous Blood Gluc Sensor (FREESTYLE LIBRE 3 SENSOR) MISC Place 1 sensor on the skin every 14 days. Use to check glucose continuously E11.69 6 each 4   desvenlafaxine (PRISTIQ) 100 MG 24 hr tablet Take 1 tablet (100 mg total) by mouth daily. 90 tablet 3   empagliflozin (JARDIANCE) 25 MG TABS tablet TAKE 1 TABLET BY MOUTH BEFORE BREAKFAST  90 tablet 3   insulin glargine-yfgn (SEMGLEE, YFGN,) 100 UNIT/ML Pen INJECT 70 UNITS SUBCUTANEOUSLY ONCE DAILY 15 mL 3   Insulin Pen Needle (RELION PEN NEEDLES) 32G X 4 MM MISC USE TO INJECT MEDICATION ONCE DAILY Dx 10.9 100 each 0   omeprazole (PRILOSEC) 20 MG capsule TAKE 1 CAPSULE BY MOUTH ONCE DAILY 30-60 MINUTES BEFORE BREAKFAST AS NEEDED. 30 capsule 0   Semaglutide, 2 MG/DOSE, 8 MG/3ML SOPN Inject 2 mg as directed once a week. Dose change 9 mL 3   SUMAtriptan (IMITREX) 100 MG tablet May repeat in 2 hours if headache persists or recurs. 10 tablet 3   telmisartan-hydrochlorothiazide (MICARDIS HCT) 40-12.5 MG tablet Take 1/2 (one-half) tablet by mouth once daily 45 tablet 3   Trospium Chloride 60 MG CP24 Take 1 capsule by mouth once daily 30 capsule 8   valACYclovir (VALTREX) 1000 MG tablet Take 1,000 mg by mouth 2 (two) times daily.     YUVAFEM 10 MCG TABS vaginal tablet Place vaginally.     metoCLOPramide (REGLAN) 5 MG tablet Take 1 tablet (5 mg total) by mouth in the morning and at bedtime. Please keep your upcoming appointment for any further refills. Thank you 90 tablet 6   No current facility-administered medications for this visit.    Review of Systems: No chest pain. No shortness of breath. No urinary complaints.    Physical Exam  Filed Weights   03/22/23 1424  Weight: 188 lb 4 oz (85.4 kg)   Wt Readings from Last 3 Encounters:  03/22/23 188 lb 4 oz (85.4 kg)  11/22/22 195 lb (88.5 kg)  08/22/22 195 lb (88.5 kg)    BP 90/70 (BP Location: Left Arm, Patient Position: Sitting, Cuff Size: Normal)   Pulse 100   Ht 5\' 4"  (1.626 m) Comment: height measured without shoes  Wt 188 lb 4 oz (85.4 kg)   BMI 32.31 kg/m  Constitutional:  Pleasant, generally well appearing female in no acute distress. Psychiatric: Normal mood and affect. Behavior is normal. EENT: Pupils normal.  Conjunctivae are normal. No scleral icterus. Neck supple.  Cardiovascular: Normal rate, regular rhythm.   Pulmonary/chest: Effort normal and breath sounds normal. No wheezing, rales or rhonchi. Abdominal: Soft, nondistended, nontender. Bowel sounds active throughout. There are no masses palpable. No hepatomegaly. Neurological: Alert and oriented to person place and time.    Willette Cluster, NP  03/22/2023, 4:04 PM  Cc:  Delynn Flavin M, DO

## 2023-03-22 NOTE — Patient Instructions (Addendum)
We have refilled your Reglan   Please give [place] a call to get schedule for a 6 month follow up ( in April ) _______________________________________________________  If your blood pressure at your visit was 140/90 or greater, please contact your primary care physician to follow up on this.  _______________________________________________________  If you are age 63 or older, your body mass index should be between 23-30. Your Body mass index is 32.31 kg/m. If this is out of the aforementioned range listed, please consider follow up with your Primary Care Provider.  If you are age 40 or younger, your body mass index should be between 19-25. Your Body mass index is 32.31 kg/m. If this is out of the aformentioned range listed, please consider follow up with your Primary Care Provider.   ________________________________________________________  The [facility] providers would like to encourage you to use [facility] to communicate with providers for non-urgent requests or questions.  Due to long hold times on the telephone, sending your provider a message by [facility] may be a faster and more efficient way to get a response.  Please allow 48 business hours for a response.  Please remember that this is for non-urgent requests.  _______________________________________________________ It was a pleasure to see you today!  Thank you for trusting me with your gastrointestinal care!

## 2023-03-26 ENCOUNTER — Encounter: Payer: Self-pay | Admitting: Family Medicine

## 2023-03-26 ENCOUNTER — Ambulatory Visit: Payer: BC Managed Care – PPO | Admitting: Family Medicine

## 2023-03-26 VITALS — BP 112/75 | HR 84 | Temp 98.8°F | Ht 64.0 in | Wt 188.0 lb

## 2023-03-26 DIAGNOSIS — E119 Type 2 diabetes mellitus without complications: Secondary | ICD-10-CM | POA: Diagnosis not present

## 2023-03-26 DIAGNOSIS — E1159 Type 2 diabetes mellitus with other circulatory complications: Secondary | ICD-10-CM

## 2023-03-26 DIAGNOSIS — E1169 Type 2 diabetes mellitus with other specified complication: Secondary | ICD-10-CM

## 2023-03-26 DIAGNOSIS — K3184 Gastroparesis: Secondary | ICD-10-CM | POA: Diagnosis not present

## 2023-03-26 DIAGNOSIS — I152 Hypertension secondary to endocrine disorders: Secondary | ICD-10-CM

## 2023-03-26 DIAGNOSIS — Z7985 Long-term (current) use of injectable non-insulin antidiabetic drugs: Secondary | ICD-10-CM

## 2023-03-26 DIAGNOSIS — E785 Hyperlipidemia, unspecified: Secondary | ICD-10-CM

## 2023-03-26 LAB — BAYER DCA HB A1C WAIVED: HB A1C (BAYER DCA - WAIVED): 7.5 % — ABNORMAL HIGH (ref 4.8–5.6)

## 2023-03-26 NOTE — Progress Notes (Signed)
Subjective: CC:DM PCP: Raliegh Ip, DO WUJ:WJXB Carrie [name] is a 62 y.o. female presenting to clinic today for:  1. Type 2 Diabetes with hypertension, hyperlipidemia:  She has not been utilizing her freestyle libre due to cost and that it would frequently sound alarms in the middle the night.  She subsequently only checks her blood sugar via fingerstick maybe once per week.  No hypoglycemic episodes reported.  She is compliant with 70 units of Semglee, 25 mg of Jardiance, 2 mg of Ozempic.  She is compliant with her statin and blood pressure regimen.  She was recently diagnosed with gastroparesis and is now on Reglan.  She is weaned herself down to just once daily and admits that she has not had any breakthrough GI side effects anymore.  She understands that Ozempic could in fact be the cause of her gastroparesis and would be willing to trial off of it if her symptoms return after discontinuation of metoclopramide.  Diabetes Health Maintenance Due  Topic Date Due   HEMOGLOBIN A1C  05/24/2023   FOOT EXAM  08/22/2023   OPHTHALMOLOGY EXAM  02/09/2024    Last A1c:  Lab Results  Component Value Date   HGBA1C 7.1 (H) 11/22/2022    ROS: No chest pain, shortness of breath, blurred vision.  She does get vaginal irritation however and reports recurrent yeast infections that she has been getting treated by another provider.   ROS: Per HPI  Allergies  Allergen Reactions   Metformin Hcl     Other Reaction(s): upset stomach   Past Medical History:  Diagnosis Date   Depression    Diabetes mellitus without complication (HCC)    Fever blister    Gastroparesis    Hyperlipidemia    Hypertension    Migraine     Current Outpatient Medications:    aspirin EC 81 MG tablet, Take 81 mg by mouth daily. Swallow whole., Disp: , Rfl:    atorvastatin (LIPITOR) 10 MG tablet, Take 1 tablet (10 mg total) by mouth daily., Disp: 90 tablet, Rfl: 3   cholecalciferol (VITAMIN D3) 25 MCG (1000 UNIT)  tablet, Take 1,000 Units by mouth daily., Disp: , Rfl:    clobetasol ointment (TEMOVATE) 0.05 %, Apply 1 Application topically as needed., Disp: , Rfl:    Continuous Blood Gluc Sensor (FREESTYLE LIBRE 3 SENSOR) MISC, Place 1 sensor on the skin every 14 days. Use to check glucose continuously E11.69, Disp: 6 each, Rfl: 4   desvenlafaxine (PRISTIQ) 100 MG 24 hr tablet, Take 1 tablet (100 mg total) by mouth daily., Disp: 90 tablet, Rfl: 3   empagliflozin (JARDIANCE) 25 MG TABS tablet, TAKE 1 TABLET BY MOUTH BEFORE BREAKFAST, Disp: 90 tablet, Rfl: 3   insulin glargine-yfgn (SEMGLEE, YFGN,) 100 UNIT/ML Pen, INJECT 70 UNITS SUBCUTANEOUSLY ONCE DAILY, Disp: 15 mL, Rfl: 3   Insulin Pen Needle (RELION PEN NEEDLES) 32G X 4 MM MISC, USE TO INJECT MEDICATION ONCE DAILY Dx 10.9, Disp: 100 each, Rfl: 0   metoCLOPramide (REGLAN) 5 MG tablet, Take 1 tablet (5 mg total) by mouth in the morning and at bedtime. Please keep your upcoming appointment for any further refills. Thank you, Disp: 90 tablet, Rfl: 6   omeprazole (PRILOSEC) 20 MG capsule, TAKE 1 CAPSULE BY MOUTH ONCE DAILY 30-60 MINUTES BEFORE BREAKFAST AS NEEDED., Disp: 30 capsule, Rfl: 0   Semaglutide, 2 MG/DOSE, 8 MG/3ML SOPN, Inject 2 mg as directed once a week. Dose change, Disp: 9 mL, Rfl: 3   SUMAtriptan (  IMITREX) 100 MG tablet, May repeat in 2 hours if headache persists or recurs., Disp: 10 tablet, Rfl: 3   telmisartan-hydrochlorothiazide (MICARDIS HCT) 40-12.5 MG tablet, Take 1/2 (one-half) tablet by mouth once daily, Disp: 45 tablet, Rfl: 3   Trospium Chloride 60 MG CP24, Take 1 capsule by mouth once daily, Disp: 30 capsule, Rfl: 8   valACYclovir (VALTREX) 1000 MG tablet, Take 1,000 mg by mouth 2 (two) times daily., Disp: , Rfl:    YUVAFEM 10 MCG TABS vaginal tablet, Place vaginally., Disp: , Rfl:  Social History   Socioeconomic History   Marital status: Married    Spouse name: Not on file   Number of children: 2   Years of education: Not on file    Highest education level: Not on file  Occupational History   Occupation: customer service  Tobacco Use   Smoking status: Former    Current packs/day: 0.00    Average packs/day: 0.5 packs/day for 40.0 years (20.0 ttl pk-yrs)    Types: Cigarettes    Start date: 6    Quit date: 2006    Years since quitting: 18.8   Smokeless tobacco: Never  Vaping Use   Vaping status: Never Used  Substance and Sexual Activity   Alcohol use: Yes    Comment: occasional    Drug use: No   Sexual activity: Yes    Birth control/protection: Post-menopausal  Other Topics Concern   Not on file  Social History Narrative   .      Right handed    Lives with husband    Social Determinants of Health   Financial Resource Strain: Not on file  Food Insecurity: Not on file  Transportation Needs: Not on file  Physical Activity: Not on file  Stress: Not on file  Social Connections: Unknown (10/18/2021)   Received from [facility]   Social Network    Social Network: Not on file  Intimate Partner Violence: Unknown (09/09/2021)   Received from [facility], [facility]   HITS    Physically Hurt: Not on file    Insult or Talk Down To: Not on file    Threaten Physical Harm: Not on file    Scream or Curse: Not on file   Family History  Problem Relation Age of Onset   Ovarian cancer Mother    Mental illness Daughter    Migraines Daughter    Colon cancer Neg Hx    Rectal cancer Neg Hx    Throat cancer Neg Hx     Objective: Office vital signs reviewed. BP 112/75   Pulse 84   Temp 98.8 F (37.1 C)   Ht 5\' 4"  (1.626 m)   Wt 188 lb (85.3 kg)   SpO2 94%   BMI 32.27 kg/m   Physical Examination:  General: Awake, alert, obese, No acute distress HEENT: sclera white, MMM Cardio: regular rate and rhythm, S1S2 heard, no murmurs appreciated Pulm: clear to auscultation bilaterally, no wheezes, rhonchi or rales; normal work of breathing on room air  Assessment/ Plan: 62 y.o. female    Diabetes mellitus treated with injections of non-insulin medication (HCC) - Plan: Bayer DCA Hb A1c Waived  Hypertension associated with diabetes (HCC)  Hyperlipidemia associated with type 2 diabetes mellitus (HCC)  Gastroparesis   Sugar not at goal with A1c of 7.5 today.  This has gone up despite increasing her diabetes regimen.  I discussed with her that we may need to simply advance her insulin to mealtime  insulin and potentially abandon Ozempic given gastroparesis.  I have highly recommended that she clean up diet as she is really not following a diet at this point.  Blood pressure is controlled.  No changes  Continue statin  Raliegh Ip, DO [facility] [phone]

## 2023-04-23 ENCOUNTER — Telehealth: Payer: Self-pay | Admitting: Family Medicine

## 2023-04-23 NOTE — Telephone Encounter (Signed)
Spoke with pt she would like to wait until G is back tomorrow

## 2023-04-23 NOTE — Telephone Encounter (Signed)
Copied from CRM [id number]. Topic: Clinical - Medical Advice >> Apr 23, 2023 10:35 AM Georgeanna Harrison H wrote: Reason for CRM: PT states she is homesick and wanted to know if she can have nausea meds called in. Please reach out

## 2023-04-24 ENCOUNTER — Encounter: Payer: Self-pay | Admitting: Family Medicine

## 2023-04-24 ENCOUNTER — Telehealth (INDEPENDENT_AMBULATORY_CARE_PROVIDER_SITE_OTHER): Payer: BC Managed Care – PPO | Admitting: Family Medicine

## 2023-04-24 DIAGNOSIS — A084 Viral intestinal infection, unspecified: Secondary | ICD-10-CM | POA: Diagnosis not present

## 2023-04-24 MED ORDER — ONDANSETRON 4 MG PO TBDP
4.0000 mg | ORAL_TABLET | Freq: Three times a day (TID) | ORAL | 0 refills | Status: AC | PRN
Start: 2023-04-24 — End: ?

## 2023-04-24 NOTE — Progress Notes (Signed)
MyChart Video visit  Subjective: CC:GI illness PCP: Carrie [name] Carrie [name] is a 62 y.o. female. Patient provides verbal consent for consult held via video.  Due to COVID-19 pandemic this visit was conducted virtually. This visit type was conducted due to national recommendations for restrictions regarding the COVID-19 Pandemic (e.g. social distancing, sheltering in place) in an effort to limit this patient's exposure and mitigate transmission in our community. All issues noted in this document were discussed and addressed.  A physical exam was not performed with this format.   Location of patient: home Location of provider: [facility] Others present for call: none  1. Nausea She reports vomiting x2 days (she stopped reglan and now she has abdominal pain/ nausea all the time x1 month).  She is worried about resuming use of Reglan given the risks associated with medication.  She reports no known sick contacts.  No blood in stool.  No fevers reported.  Has missed 2 days of work due to illness   ROS: Per HPI  Allergies  Allergen Reactions   Metformin Hcl     Other Reaction(s): upset stomach   Past Medical History:  Diagnosis Date   Depression    Diabetes mellitus without complication (HCC)    Fever blister    Gastroparesis    Hyperlipidemia    Hypertension    Migraine     Current Outpatient Medications:    aspirin EC 81 MG tablet, Take 81 mg by mouth daily. Swallow whole., Disp: , Rfl:    atorvastatin (LIPITOR) 10 MG tablet, Take 1 tablet (10 mg total) by mouth daily., Disp: 90 tablet, Rfl: 3   cholecalciferol (VITAMIN D3) 25 MCG (1000 UNIT) tablet, Take 1,000 Units by mouth daily., Disp: , Rfl:    clobetasol ointment (TEMOVATE) 0.05 %, Apply 1 Application topically as needed., Disp: , Rfl:    Continuous Blood Gluc Sensor (FREESTYLE LIBRE 3 SENSOR) MISC, Place 1 sensor on the skin every 14 days. Use to check glucose continuously E11.69, Disp: 6 each, Rfl: 4    desvenlafaxine (PRISTIQ) 100 MG 24 hr tablet, Take 1 tablet (100 mg total) by mouth daily., Disp: 90 tablet, Rfl: 3   empagliflozin (JARDIANCE) 25 MG TABS tablet, TAKE 1 TABLET BY MOUTH BEFORE BREAKFAST, Disp: 90 tablet, Rfl: 3   insulin glargine-yfgn (SEMGLEE, YFGN,) 100 UNIT/ML Pen, INJECT 70 UNITS SUBCUTANEOUSLY ONCE DAILY, Disp: 15 mL, Rfl: 3   Insulin Pen Needle (RELION PEN NEEDLES) 32G X 4 MM MISC, USE TO INJECT MEDICATION ONCE DAILY Dx 10.9, Disp: 100 each, Rfl: 0   metoCLOPramide (REGLAN) 5 MG tablet, Take 1 tablet (5 mg total) by mouth in the morning and at bedtime. Please keep your upcoming appointment for any further refills. Thank you, Disp: 90 tablet, Rfl: 6   omeprazole (PRILOSEC) 20 MG capsule, TAKE 1 CAPSULE BY MOUTH ONCE DAILY 30-60 MINUTES BEFORE BREAKFAST AS NEEDED., Disp: 30 capsule, Rfl: 0   Semaglutide, 2 MG/DOSE, 8 MG/3ML SOPN, Inject 2 mg as directed once a week. Dose change, Disp: 9 mL, Rfl: 3   SUMAtriptan (IMITREX) 100 MG tablet, May repeat in 2 hours if headache persists or recurs., Disp: 10 tablet, Rfl: 3   telmisartan-hydrochlorothiazide (MICARDIS HCT) 40-12.5 MG tablet, Take 1/2 (one-half) tablet by mouth once daily, Disp: 45 tablet, Rfl: 3   Trospium Chloride 60 MG CP24, Take 1 capsule by mouth once daily, Disp: 30 capsule, Rfl: 8   valACYclovir (VALTREX) 1000 MG tablet, Take 1,000 mg by mouth  2 (two) times daily., Disp: , Rfl:    YUVAFEM 10 MCG TABS vaginal tablet, Place vaginally., Disp: , Rfl:   Gen: Nontoxic female no acute distress.  No scleral icterus appreciated. Neuro: Alert and oriented.  Follows commands  62 y.o. female   Viral gastroenteritis - Plan: ondansetron (ZOFRAN-ODT) 4 MG disintegrating tablet  Question viral gastroenteritis superimposed on gastroparesis?  I am going to trial her on Zofran.  However we discussed that if symptoms [name] not improve with Zofran alone she may need to resume use of Reglan.  I again reiterated the potential risks with  that versus benefits.  Encouraged p.o. hydration, particularly given use of Jardiance.  We discussed red flag signs and symptoms warranting further evaluation and she voiced good understanding  Start time: 12:23p End time: 12:29p  Total time spent on patient care (including video visit/ documentation): 8 minutes  Carrie [name] Carrie Skains, [name] [facility] [phone]

## 2023-04-26 ENCOUNTER — Other Ambulatory Visit: Payer: Self-pay | Admitting: *Deleted

## 2023-04-26 DIAGNOSIS — R112 Nausea with vomiting, unspecified: Secondary | ICD-10-CM

## 2023-06-20 ENCOUNTER — Encounter: Payer: Self-pay | Admitting: *Deleted

## 2023-06-25 DIAGNOSIS — B078 Other viral warts: Secondary | ICD-10-CM | POA: Diagnosis not present

## 2023-06-25 DIAGNOSIS — L570 Actinic keratosis: Secondary | ICD-10-CM | POA: Diagnosis not present

## 2023-06-25 DIAGNOSIS — L918 Other hypertrophic disorders of the skin: Secondary | ICD-10-CM | POA: Diagnosis not present

## 2023-06-25 DIAGNOSIS — D485 Neoplasm of uncertain behavior of skin: Secondary | ICD-10-CM | POA: Diagnosis not present

## 2023-06-25 DIAGNOSIS — D225 Melanocytic nevi of trunk: Secondary | ICD-10-CM | POA: Diagnosis not present

## 2023-06-25 DIAGNOSIS — D2261 Melanocytic nevi of right upper limb, including shoulder: Secondary | ICD-10-CM | POA: Diagnosis not present

## 2023-06-25 DIAGNOSIS — L820 Inflamed seborrheic keratosis: Secondary | ICD-10-CM | POA: Diagnosis not present

## 2023-06-25 DIAGNOSIS — D2272 Melanocytic nevi of left lower limb, including hip: Secondary | ICD-10-CM | POA: Diagnosis not present

## 2023-06-25 DIAGNOSIS — L821 Other seborrheic keratosis: Secondary | ICD-10-CM | POA: Diagnosis not present

## 2023-07-05 ENCOUNTER — Ambulatory Visit: Payer: BC Managed Care – PPO | Admitting: Family Medicine

## 2023-08-02 ENCOUNTER — Ambulatory Visit (INDEPENDENT_AMBULATORY_CARE_PROVIDER_SITE_OTHER): Payer: BC Managed Care – PPO | Admitting: Family Medicine

## 2023-08-02 ENCOUNTER — Encounter: Payer: Self-pay | Admitting: Family Medicine

## 2023-08-02 VITALS — BP 109/77 | HR 93 | Temp 98.5°F | Ht 64.0 in | Wt 183.4 lb

## 2023-08-02 DIAGNOSIS — Z7985 Long-term (current) use of injectable non-insulin antidiabetic drugs: Secondary | ICD-10-CM | POA: Diagnosis not present

## 2023-08-02 DIAGNOSIS — H02826 Cysts of left eye, unspecified eyelid: Secondary | ICD-10-CM

## 2023-08-02 DIAGNOSIS — I152 Hypertension secondary to endocrine disorders: Secondary | ICD-10-CM

## 2023-08-02 DIAGNOSIS — F331 Major depressive disorder, recurrent, moderate: Secondary | ICD-10-CM

## 2023-08-02 DIAGNOSIS — Z0000 Encounter for general adult medical examination without abnormal findings: Secondary | ICD-10-CM

## 2023-08-02 DIAGNOSIS — K3184 Gastroparesis: Secondary | ICD-10-CM

## 2023-08-02 DIAGNOSIS — Z638 Other specified problems related to primary support group: Secondary | ICD-10-CM | POA: Diagnosis not present

## 2023-08-02 DIAGNOSIS — E785 Hyperlipidemia, unspecified: Secondary | ICD-10-CM

## 2023-08-02 DIAGNOSIS — E1169 Type 2 diabetes mellitus with other specified complication: Secondary | ICD-10-CM | POA: Diagnosis not present

## 2023-08-02 DIAGNOSIS — E1159 Type 2 diabetes mellitus with other circulatory complications: Secondary | ICD-10-CM | POA: Diagnosis not present

## 2023-08-02 DIAGNOSIS — E119 Type 2 diabetes mellitus without complications: Secondary | ICD-10-CM | POA: Diagnosis not present

## 2023-08-02 DIAGNOSIS — Z0001 Encounter for general adult medical examination with abnormal findings: Secondary | ICD-10-CM | POA: Diagnosis not present

## 2023-08-02 LAB — LIPID PANEL

## 2023-08-02 LAB — BAYER DCA HB A1C WAIVED: HB A1C (BAYER DCA - WAIVED): 7.1 % — ABNORMAL HIGH (ref 4.8–5.6)

## 2023-08-02 MED ORDER — CARIPRAZINE HCL 1.5 MG PO CAPS: 1.5000 mg | ORAL_CAPSULE | Freq: Every day | ORAL | 0 refills | Status: DC

## 2023-08-02 NOTE — Progress Notes (Addendum)
 Carrie [name] is a 63 y.o. female presents to office today for annual physical exam examination.    Concerns today include: 1. Type 2 Diabetes with hypertension, hyperlipidemia:  Patient reports compliant with all medications.  She reports no hypoglycemic episodes.  She has been having more stress as of late, see below  Last eye exam: Up-to-date Last foot exam: Up-to-date Last A1c:  Lab Results  Component Value Date   HGBA1C 7.5 (H) 03/26/2023   Nephropathy screen indicated?:  Needs Last flu, zoster and/or pneumovax:  Immunization History  Administered Date(s) Administered   Influenza,inj,Quad PF,6+ Mos 07/07/2016, 03/28/2017, 04/02/2018, 04/07/2019, 05/11/2021, 03/20/2023   Influenza-Unspecified 04/17/2016   Moderna Sars-Covid-2 Vaccination 08/11/2019, 09/09/2019, 06/11/2020   PNEUMOCOCCAL CONJUGATE-20 05/11/2021   Pneumococcal Polysaccharide-23 01/23/2019   Tdap 01/23/2019   Zoster Recombinant(Shingrix) 11/16/2020, 06/02/2021    ROS: Denies dizziness, LOC, polyuria, polydipsia, unintended weight loss/gain, foot ulcerations, numbness or tingling in extremities, shortness of breath or chest pain.  2.  Major depressive disorder Previously controlled with Pristiq 100 mg daily.  She notes that over the last several months she has been having increasing symptoms where she is emotionally labile and cries frequently.  She has been having increased stress with the relationship with her oldest daughter and she essentially feels estranged.  She notes that their last encounter was a big disaster where she stormed off.  She is not engaged in any counseling services but notes that she has been having a buildup of various issues over the last several years.  She also reports stress from work as there is uncertainty about security of her job.  Her relationship with her spouse has been good however.  She reports no suicidal intent but does have thoughts of hopelessness and sometimes has felt like  she would be better "just falling off the face to the air".   Occupation: Works for [employer], Marital status: Married, Substance use: None Health Maintenance Due  Topic Date Due   COVID-19 Vaccine (4 - 2024-25 season) 02/04/2023   MAMMOGRAM  07/21/2023   Diabetic kidney evaluation - eGFR measurement  08/22/2023   Diabetic kidney evaluation - Urine ACR  08/22/2023   Refills needed today: None  Immunization History  Administered Date(s) Administered   Influenza,inj,Quad PF,6+ Mos 07/07/2016, 03/28/2017, 04/02/2018, 04/07/2019, 05/11/2021, 03/20/2023   Influenza-Unspecified 04/17/2016   Moderna Sars-Covid-2 Vaccination 08/11/2019, 09/09/2019, 06/11/2020   PNEUMOCOCCAL CONJUGATE-20 05/11/2021   Pneumococcal Polysaccharide-23 01/23/2019   Tdap 01/23/2019   Zoster Recombinant(Shingrix) 11/16/2020, 06/02/2021   Past Medical History:  Diagnosis Date   Depression    Diabetes mellitus without complication (HCC)    Fever blister    Gastroparesis    Hyperlipidemia    Hypertension    Migraine    Social History   Socioeconomic History   Marital status: Married    Spouse name: Not on file   Number of children: 2   Years of education: Not on file   Highest education level: Not on file  Occupational History   Occupation: customer service  Tobacco Use   Smoking status: Former    Current packs/day: 0.00    Average packs/day: 0.5 packs/day for 40.0 years (20.0 ttl pk-yrs)    Types: Cigarettes    Start date: 1966    Quit date: 2006    Years since quitting: 19.1   Smokeless tobacco: Never  Vaping Use   Vaping status: Never Used  Substance and Sexual Activity   Alcohol use: Yes    Comment:  occasional    Drug use: No   Sexual activity: Yes    Birth control/protection: Post-menopausal  Other Topics Concern   Not on file  Social History Narrative   .      Right handed    Lives with husband    Social Drivers of Corporate investment banker Strain: Not on file   Food Insecurity: Not on file  Transportation Needs: Not on file  Physical Activity: Not on file  Stress: Not on file  Social Connections: Unknown (10/18/2021)   Received from [facility]   Social Network    Social Network: Not on file  Intimate Partner Violence: Unknown (09/09/2021)   Received from [facility], [facility]   HITS    Physically Hurt: Not on file    Insult or Talk Down To: Not on file    Threaten Physical Harm: Not on file    Scream or Curse: Not on file   Past Surgical History:  Procedure Laterality Date   BACK SURGERY     CHOLECYSTECTOMY  2004   Family History  Problem Relation Age of Onset   Ovarian cancer Mother    Mental illness Daughter    Migraines Daughter    Colon cancer Neg Hx    Rectal cancer Neg Hx    Throat cancer Neg Hx     Current Outpatient Medications:    aspirin EC 81 MG tablet, Take 81 mg by mouth daily. Swallow whole., Disp: , Rfl:    atorvastatin (LIPITOR) 10 MG tablet, Take 1 tablet (10 mg total) by mouth daily., Disp: 90 tablet, Rfl: 3   cholecalciferol (VITAMIN D3) 25 MCG (1000 UNIT) tablet, Take 1,000 Units by mouth daily., Disp: , Rfl:    clobetasol ointment (TEMOVATE) 0.05 %, Apply 1 Application topically as needed., Disp: , Rfl:    Continuous Blood Gluc Sensor (FREESTYLE LIBRE 3 SENSOR) MISC, Place 1 sensor on the skin every 14 days. Use to check glucose continuously E11.69, Disp: 6 each, Rfl: 4   desvenlafaxine (PRISTIQ) 100 MG 24 hr tablet, Take 1 tablet (100 mg total) by mouth daily., Disp: 90 tablet, Rfl: 3   empagliflozin (JARDIANCE) 25 MG TABS tablet, TAKE 1 TABLET BY MOUTH BEFORE BREAKFAST, Disp: 90 tablet, Rfl: 3   insulin glargine-yfgn (SEMGLEE, YFGN,) 100 UNIT/ML Pen, INJECT 70 UNITS SUBCUTANEOUSLY ONCE DAILY, Disp: 15 mL, Rfl: 3   Insulin Pen Needle (RELION PEN NEEDLES) 32G X 4 MM MISC, USE TO INJECT MEDICATION ONCE DAILY Dx 10.9, Disp: 100 each, Rfl: 0   metoCLOPramide (REGLAN) 5 MG tablet, Take 1  tablet (5 mg total) by mouth in the morning and at bedtime. Please keep your upcoming appointment for any further refills. Thank you, Disp: 90 tablet, Rfl: 6   omeprazole (PRILOSEC) 20 MG capsule, TAKE 1 CAPSULE BY MOUTH ONCE DAILY 30-60 MINUTES BEFORE BREAKFAST AS NEEDED., Disp: 30 capsule, Rfl: 0   ondansetron (ZOFRAN-ODT) 4 MG disintegrating tablet, Take 1 tablet (4 mg total) by mouth every 8 (eight) hours as needed for nausea or vomiting., Disp: 20 tablet, Rfl: 0   Semaglutide, 2 MG/DOSE, 8 MG/3ML SOPN, Inject 2 mg as directed once a week. Dose change, Disp: 9 mL, Rfl: 3   SUMAtriptan (IMITREX) 100 MG tablet, May repeat in 2 hours if headache persists or recurs., Disp: 10 tablet, Rfl: 3   telmisartan-hydrochlorothiazide (MICARDIS HCT) 40-12.5 MG tablet, Take 1/2 (one-half) tablet by mouth once daily, Disp: 45 tablet, Rfl: 3   Trospium  Chloride 60 MG CP24, Take 1 capsule by mouth once daily, Disp: 30 capsule, Rfl: 8   valACYclovir (VALTREX) 1000 MG tablet, Take 1,000 mg by mouth 2 (two) times daily., Disp: , Rfl:    YUVAFEM 10 MCG TABS vaginal tablet, Place vaginally., Disp: , Rfl:   Allergies  Allergen Reactions   Metformin Hcl     Other Reaction(s): upset stomach     ROS: Review of Systems Pertinent items noted in HPI and remainder of comprehensive ROS otherwise negative.    Physical exam BP 109/77   Pulse 93   Temp 98.5 F (36.9 C)   Ht 5\' 4"  (1.626 m)   Wt 183 lb 6.4 oz (83.2 kg)   SpO2 95%   BMI 31.48 kg/m  General appearance: alert, cooperative, appears stated age, no distress, and moderately obese Head: Normocephalic, without obvious abnormality, atraumatic Eyes: negative findings: Left medial upper eyelid with appreciable cyst.  No evidence of secondary infection or complication.  No ptosis.  Lashes normal, conjunctivae and sclerae normal, corneas clear, and pupils equal, round, reactive to light and accomodation Ears: normal TM's and external ear canals both ears Nose:  Nares normal. Septum midline. Mucosa normal. No drainage or sinus tenderness. Throat: lips, mucosa, and tongue normal; teeth and gums normal Neck: no adenopathy, supple, symmetrical, trachea midline, and thyroid not enlarged, symmetric, no tenderness/mass/nodules Back: symmetric, no curvature. ROM normal. No CVA tenderness. Lungs: clear to auscultation bilaterally Heart: regular rate and rhythm, S1, S2 normal, no murmur, click, rub or gallop Abdomen: soft, non-tender; bowel sounds normal; no masses,  no organomegaly Extremities: extremities normal, atraumatic, no cyanosis or edema Pulses: 2+ and symmetric Skin: Skin color, texture, turgor normal. No rashes or lesions Lymph nodes: Cervical, supraclavicular, and axillary nodes normal. Neurologic: Grossly normal  Psych: Very tearful.  Mood depressed     08/02/2023    8:36 AM 11/22/2022    4:24 PM 08/22/2022    4:00 PM  Depression screen PHQ 2/9  Decreased Interest 0 1 1  Down, Depressed, Hopeless 2 1 1   PHQ - 2 Score 2 2 2   Altered sleeping 3 1 1   Tired, decreased energy 1 1 1   Change in appetite 1 0 0  Feeling bad or failure about yourself  2 0 1  Trouble concentrating 0 0 2  Moving slowly or fidgety/restless 0 0 0  Suicidal thoughts 0 0 0  PHQ-9 Score 9 4 7   Difficult doing work/chores  Somewhat difficult Extremely dIfficult      08/02/2023    8:36 AM 11/22/2022    4:23 PM 08/22/2022    4:00 PM 12/27/2021    8:14 AM  GAD 7 : Generalized Anxiety Score  Nervous, Anxious, on Edge 1 1 1  0  Control/stop worrying 1 1 1  0  Worry too much - different things 1 1 1  0  Trouble relaxing 1 2 0 0  Restless 1 0 0 0  Easily annoyed or irritable 1 0 0 0  Afraid - awful might happen 1  0 0  Total GAD 7 Score 7  3 0  Anxiety Difficulty Somewhat difficult Somewhat difficult Somewhat difficult Not difficult at all     Assessment/ Plan: Valente David here for annual physical exam.   Annual physical exam  Diabetes mellitus treated with  injections of non-insulin medication (HCC) - Plan: CMP14+EGFR, Bayer DCA Hb A1c Waived, Microalbumin / creatinine urine ratio  Hypertension associated with diabetes (HCC) - Plan: CMP14+EGFR  Hyperlipidemia associated with  type 2 diabetes mellitus (HCC) - Plan: CMP14+EGFR, Lipid Panel, TSH  Gastroparesis - Plan: CBC  Moderate episode of recurrent major depressive disorder (HCC) - Plan: cariprazine (VRAYLAR) 1.5 MG capsule, Ambulatory referral to Psychology  Stress due to family tension - Plan: Ambulatory referral to Psychology  Eyelid cyst, left  A1c has come down some since last visit but still technically uncontrolled at 7.1.  Because of the stress reaction that she is having I am hesitant to make any major medical changes today except for mood medicines as below.  For now we will continue current regimen and I will see her closely back in 6 weeks for interval follow-up  Her blood pressure was controlled.  Could consider reducing Micardis even further  Lipid panel collected along with liver function test.  She will continue statin  Check CBC given history of gastroparesis  I am adding Vraylar to see if perhaps this might help improve the functionality of her Pristiq.  I gave her a months worth of samples as well as a coupon and sent it to pharmacy for her.  I am placing a referral to psychology as I do think that she needs some assistance with therapy.  The other sounds like there is quite a bit of on dealt with issues between her and her daughter.  Hopefully we can get her a female provider as this was her preference.  She was also noted to have a cyst of the left medial upper eyelid.  She is going to get in touch with her ophthalmologist to talk about resection as it has been causing some issues  Counseled on healthy lifestyle choices, including diet (rich in fruits, vegetables and lean meats and low in salt and simple carbohydrates) and exercise (at least 30 minutes of moderate physical  activity daily).  Patient to follow up 6weeks  Jani Ploeger M. Nadine Counts, DO

## 2023-08-03 ENCOUNTER — Encounter: Payer: Self-pay | Admitting: Family Medicine

## 2023-08-03 LAB — CMP14+EGFR
ALT: 23 IU/L (ref 0–32)
AST: 24 IU/L (ref 0–40)
Albumin: 4.4 g/dL (ref 3.9–4.9)
Alkaline Phosphatase: 91 IU/L (ref 44–121)
BUN/Creatinine Ratio: 19 (ref 12–28)
BUN: 15 mg/dL (ref 8–27)
Bilirubin Total: 0.8 mg/dL (ref 0.0–1.2)
CO2: 22 mmol/L (ref 20–29)
Calcium: 9.5 mg/dL (ref 8.7–10.3)
Chloride: 99 mmol/L (ref 96–106)
Creatinine, Ser: 0.78 mg/dL (ref 0.57–1.00)
Globulin, Total: 2.3 g/dL (ref 1.5–4.5)
Glucose: 126 mg/dL — ABNORMAL HIGH (ref 70–99)
Potassium: 4.1 mmol/L (ref 3.5–5.2)
Sodium: 140 mmol/L (ref 134–144)
Total Protein: 6.7 g/dL (ref 6.0–8.5)
eGFR: 85 mL/min/{1.73_m2} (ref >59)

## 2023-08-03 LAB — MICROALBUMIN / CREATININE URINE RATIO
Creatinine, Urine: 78.1 mg/dL
Microalb/Creat Ratio: <4 mg/g{creat} (ref 0–29)
Microalbumin, Urine: <3 ug/mL

## 2023-08-03 LAB — CBC
Hematocrit: 44.4 % (ref 34.0–46.6)
Hemoglobin: 14.8 g/dL (ref 11.1–15.9)
MCH: 31 pg (ref 26.6–33.0)
MCHC: 33.3 g/dL (ref 31.5–35.7)
MCV: 93 fL (ref 79–97)
Platelets: 512 10*3/uL — ABNORMAL HIGH (ref 150–450)
RBC: 4.78 x10E6/uL (ref 3.77–5.28)
RDW: 12 % (ref 11.7–15.4)
WBC: 8.4 10*3/uL (ref 3.4–10.8)

## 2023-08-03 LAB — LIPID PANEL
Cholesterol, Total: 154 mg/dL (ref 100–199)
HDL: 51 mg/dL (ref >39)
LDL CALC COMMENT:: 3 ratio (ref 0.0–4.4)
LDL Chol Calc (NIH): 78 mg/dL (ref 0–99)
Triglycerides: 145 mg/dL (ref 0–149)
VLDL Cholesterol Cal: 25 mg/dL (ref 5–40)

## 2023-08-03 LAB — TSH: TSH: 2.62 u[IU]/mL (ref 0.450–4.500)

## 2023-08-25 ENCOUNTER — Other Ambulatory Visit: Payer: Self-pay | Admitting: Family Medicine

## 2023-08-25 DIAGNOSIS — E1169 Type 2 diabetes mellitus with other specified complication: Secondary | ICD-10-CM

## 2023-09-04 ENCOUNTER — Other Ambulatory Visit: Payer: Self-pay | Admitting: Family Medicine

## 2023-09-04 DIAGNOSIS — I152 Hypertension secondary to endocrine disorders: Secondary | ICD-10-CM

## 2023-09-04 DIAGNOSIS — E1159 Type 2 diabetes mellitus with other circulatory complications: Secondary | ICD-10-CM

## 2023-09-05 NOTE — Telephone Encounter (Signed)
 Please make sure pt ok with change. If does not want to change, she can get it for $45 for a 85m supply at [facility] with [facility]. Let me know what she wants to do.

## 2023-09-05 NOTE — Telephone Encounter (Signed)
 Name from pharmacy: TELMISA HCTZ 40-12.5MG  TAB  Pharmacy comment: NDC not covered by insurance.  losartan/hctz preferred

## 2023-09-05 NOTE — Telephone Encounter (Signed)
 Please see below.

## 2023-09-05 NOTE — Telephone Encounter (Signed)
 See below

## 2023-09-13 ENCOUNTER — Ambulatory Visit: Payer: BC Managed Care – PPO | Admitting: Family Medicine

## 2023-09-13 ENCOUNTER — Encounter: Payer: Self-pay | Admitting: Family Medicine

## 2023-09-13 VITALS — BP 112/77 | HR 83 | Temp 98.6°F | Ht 64.0 in | Wt 186.0 lb

## 2023-09-13 DIAGNOSIS — Z7985 Long-term (current) use of injectable non-insulin antidiabetic drugs: Secondary | ICD-10-CM

## 2023-09-13 DIAGNOSIS — E1159 Type 2 diabetes mellitus with other circulatory complications: Secondary | ICD-10-CM | POA: Diagnosis not present

## 2023-09-13 DIAGNOSIS — E119 Type 2 diabetes mellitus without complications: Secondary | ICD-10-CM

## 2023-09-13 DIAGNOSIS — Z638 Other specified problems related to primary support group: Secondary | ICD-10-CM

## 2023-09-13 DIAGNOSIS — F331 Major depressive disorder, recurrent, moderate: Secondary | ICD-10-CM | POA: Diagnosis not present

## 2023-09-13 DIAGNOSIS — G478 Other sleep disorders: Secondary | ICD-10-CM

## 2023-09-13 DIAGNOSIS — Z7984 Long term (current) use of oral hypoglycemic drugs: Secondary | ICD-10-CM

## 2023-09-13 DIAGNOSIS — M65332 Trigger finger, left middle finger: Secondary | ICD-10-CM | POA: Diagnosis not present

## 2023-09-13 DIAGNOSIS — J301 Allergic rhinitis due to pollen: Secondary | ICD-10-CM

## 2023-09-13 DIAGNOSIS — I152 Hypertension secondary to endocrine disorders: Secondary | ICD-10-CM

## 2023-09-13 DIAGNOSIS — R5382 Chronic fatigue, unspecified: Secondary | ICD-10-CM

## 2023-09-13 DIAGNOSIS — R0683 Snoring: Secondary | ICD-10-CM

## 2023-09-13 MED ORDER — FREESTYLE LIBRE 3 PLUS SENSOR MISC: Status: DC

## 2023-09-13 MED ORDER — DESLORATADINE 5 MG PO TABS
5.0000 mg | ORAL_TABLET | Freq: Every day | ORAL | 3 refills | Status: AC
Start: 2023-09-13 — End: ?

## 2023-09-13 MED ORDER — LOSARTAN POTASSIUM-HCTZ 50-12.5 MG PO TABS: 0.5000 | ORAL_TABLET | Freq: Every day | ORAL | 3 refills | Status: DC

## 2023-09-13 NOTE — Progress Notes (Signed)
 Subjective: CC: Follow-up major depressive disorder PCP: Raliegh Ip, DO ZOX:WRUE JONIAH BEDNARSKI is a 63 y.o. female presenting to clinic today for:  1.  Major depressive disorder Patient reports that she has been doing a bit better with the addition of the Vraylar.  She continues to take Pristiq as prescribed.  She never did hear from the therapist to set up counseling appointments.  She still has her good days and her bad days.  She reports multiple nighttime awakenings, chronic fatigue.  She is always had difficulty sleeping.  Sometimes she takes ibuprofen in efforts to sleep.  She reports no adverse side effects that she knows of from the [place].  She admits to snoring but no gasping for breath  2.  Allergic rhinitis She was taking some old Xyzal but notes allergy symptoms are not controlled.  3.  Type 2 diabetes that she would hypertension, hyperlipidemia and obesity She reports struggling with weight.  She lost about 10 pounds but has really stalled and wants to know what she can do for weight loss.  She is treated with Ozempic for type 2 diabetes and is on max dose, she is also on max dose of Jardiance.  She notes that she was told recently that her insurance would no longer cover telmisartan and she is willing to switch over for the sake of keeping all meds at the same pharmacy.  Currently taking 1/2 tablet dose of the 40-12.5.  She does eat carbs but tries to limit the carbs.  Typical lunches are either a can of soup, SpaghettiOs or salads during the summer and dinner typically consists of meat and starch/vegetable.  She does not really drink any sugar.  She reports no structured exercise.  She be interested in trialing the Josephine Igo again if its affordable for blood sugar monitoring.    ROS: Per HPI  Allergies  Allergen Reactions   Metformin Hcl     Other Reaction(s): upset stomach   Past Medical History:  Diagnosis Date   Depression    Diabetes mellitus without complication  (HCC)    Fever blister    Gastroparesis    Hyperlipidemia    Hypertension    Migraine     Current Outpatient Medications:    aspirin EC 81 MG tablet, Take 81 mg by mouth daily. Swallow whole., Disp: , Rfl:    atorvastatin (LIPITOR) 10 MG tablet, Take 1 tablet (10 mg total) by mouth daily., Disp: 90 tablet, Rfl: 3   cariprazine (VRAYLAR) 1.5 MG capsule, Take 1 capsule (1.5 mg total) by mouth daily., Disp: 90 capsule, Rfl: 0   cholecalciferol (VITAMIN D3) 25 MCG (1000 UNIT) tablet, Take 1,000 Units by mouth daily., Disp: , Rfl:    clobetasol ointment (TEMOVATE) 0.05 %, Apply 1 Application topically as needed., Disp: , Rfl:    Continuous Blood Gluc Sensor (FREESTYLE LIBRE 3 SENSOR) MISC, Place 1 sensor on the skin every 14 days. Use to check glucose continuously E11.69, Disp: 6 each, Rfl: 4   desvenlafaxine (PRISTIQ) 100 MG 24 hr tablet, Take 1 tablet (100 mg total) by mouth daily., Disp: 90 tablet, Rfl: 3   insulin glargine-yfgn (SEMGLEE, YFGN,) 100 UNIT/ML Pen, INJECT 70 UNITS SUBCUTANEOUSLY ONCE DAILY, Disp: 15 mL, Rfl: 3   Insulin Pen Needle (RELION PEN NEEDLES) 32G X 4 MM MISC, USE TO INJECT MEDICATION ONCE DAILY Dx 10.9, Disp: 100 each, Rfl: 0   JARDIANCE 25 MG TABS tablet, TAKE 1 TABLET BY MOUTH BEFORE BREAKFAST, Disp: 90  tablet, Rfl: 0   metoCLOPramide (REGLAN) 5 MG tablet, Take 1 tablet (5 mg total) by mouth in the morning and at bedtime. Please keep your upcoming appointment for any further refills. Thank you, Disp: 90 tablet, Rfl: 6   omeprazole (PRILOSEC) 20 MG capsule, TAKE 1 CAPSULE BY MOUTH ONCE DAILY 30-60 MINUTES BEFORE BREAKFAST AS NEEDED., Disp: 30 capsule, Rfl: 0   ondansetron (ZOFRAN-ODT) 4 MG disintegrating tablet, Take 1 tablet (4 mg total) by mouth every 8 (eight) hours as needed for nausea or vomiting., Disp: 20 tablet, Rfl: 0   Semaglutide, 2 MG/DOSE, 8 MG/3ML SOPN, Inject 2 mg as directed once a week. Dose change, Disp: 9 mL, Rfl: 3   SUMAtriptan (IMITREX) 100 MG  tablet, May repeat in 2 hours if headache persists or recurs., Disp: 10 tablet, Rfl: 3   telmisartan-hydrochlorothiazide (MICARDIS HCT) 40-12.5 MG tablet, Take 1/2 (one-half) tablet by mouth once daily, Disp: 45 tablet, Rfl: 0   Trospium Chloride 60 MG CP24, Take 1 capsule by mouth once daily, Disp: 30 capsule, Rfl: 8   valACYclovir (VALTREX) 1000 MG tablet, Take 1,000 mg by mouth 2 (two) times daily., Disp: , Rfl:    YUVAFEM 10 MCG TABS vaginal tablet, Place vaginally., Disp: , Rfl:  Social History   Socioeconomic History   Marital status: Married    Spouse name: Not on file   Number of children: 2   Years of education: Not on file   Highest education level: Not on file  Occupational History   Occupation: customer service  Tobacco Use   Smoking status: Former    Current packs/day: 0.00    Average packs/day: 0.5 packs/day for 40.0 years (20.0 ttl pk-yrs)    Types: Cigarettes    Start date: 59    Quit date: 2006    Years since quitting: 19.2   Smokeless tobacco: Never  Vaping Use   Vaping status: Never Used  Substance and Sexual Activity   Alcohol use: Yes    Comment: occasional    Drug use: No   Sexual activity: Yes    Birth control/protection: Post-menopausal  Other Topics Concern   Not on file  Social History Narrative   .      Right handed    Lives with husband    Social Drivers of Corporate investment banker Strain: Not on file  Food Insecurity: Not on file  Transportation Needs: Not on file  Physical Activity: Not on file  Stress: Not on file  Social Connections: Unknown (10/18/2021)   Received [facility]   Social Network    Social Network: Not on file  Intimate Partner Violence: Unknown (09/09/2021)   Received from [facility], [facility]   HITS    Physically Hurt: Not on file    Insult or Talk Down To: Not on file    Threaten Physical Harm: Not on file    Scream or Curse: Not on file   Family History  Problem Relation Age  of Onset   Ovarian cancer Mother    Mental illness Daughter    Migraines Daughter    Colon cancer Neg Hx    Rectal cancer Neg Hx    Throat cancer Neg Hx     Objective: Office vital signs reviewed. BP 112/77   Pulse 83   Temp 98.6 F (37 C)   Ht 5\' 4"  (1.626 m)   Wt 186 lb (84.4 kg)   BMI 31.93 kg/m   Physical  Examination:  General: Awake, alert, obese, nontoxic female, No acute distress HEENT: Sclera white.  Moist mucous membranes Cardio: regular rate and rhythm  Pulm:  normal work of breathing on room air Psych: Mood stable, speech normal, affect appropriate.  Pleasant, interactive     09/13/2023    7:59 AM 08/02/2023    8:36 AM 11/22/2022    4:24 PM  Depression screen PHQ 2/9  Decreased Interest 1 0 1  Down, Depressed, Hopeless 0 2 1  PHQ - 2 Score 1 2 2   Altered sleeping 3 3 1   Tired, decreased energy 3 1 1   Change in appetite 3 1 0  Feeling bad or failure about yourself  2 2 0  Trouble concentrating 2 0 0  Moving slowly or fidgety/restless 0 0 0  Suicidal thoughts 0 0 0  PHQ-9 Score 14 9 4   Difficult doing work/chores Very difficult  Somewhat difficult      09/13/2023    8:00 AM 08/02/2023    8:36 AM 11/22/2022    4:23 PM 08/22/2022    4:00 PM  GAD 7 : Generalized Anxiety Score  Nervous, Anxious, on Edge 2 1 1 1   Control/stop worrying 1 1 1 1   Worry too much - different things 1 1 1 1   Trouble relaxing 1 1 2  0  Restless 1 1 0 0  Easily annoyed or irritable 2 1 0 0  Afraid - awful might happen 0 1  0  Total GAD 7 Score 8 7  3   Anxiety Difficulty Somewhat difficult Somewhat difficult Somewhat difficult Somewhat difficult    Assessment/ Plan: 63 y.o. female   Moderate episode of recurrent major depressive disorder (HCC)  Stress due to family tension  Diabetes mellitus treated with injections of non-insulin medication (HCC) - Plan: Continuous Glucose Sensor (FREESTYLE LIBRE 3 PLUS SENSOR) MISC  Hypertension associated with diabetes (HCC) - Plan:  losartan-hydrochlorothiazide (HYZAAR) 50-12.5 MG tablet  Chronic fatigue - Plan: Ambulatory referral to Sleep Studies  Snoring - Plan: Ambulatory referral to Sleep Studies  Awakens from sleep at night - Plan: Ambulatory referral to Sleep Studies  Seasonal allergic rhinitis due to pollen - Plan: desloratadine (CLARINEX) 5 MG tablet  Her scores are higher but she subjectively reports feeling better and she physically looks better than she was last visit.  Her situation is a little confounding today.  I have given her the information for the counseling services.  Will continue current regimen for now but may need to revisit this if symptomology is not improving with counseling  I have given her samples of freestyle libre x 6 weeks.  Encouraged her to reduce carbohydrate, increase fiber, vegetables and lean meats  I have switched over her valsartan to losartan-hydrochlorothiazide.  She will monitor blood pressures closely with goal of less than 140/90.  She will cut in half if the blood pressures are dropping too low  I have referred her to sleep studies to have a home sleep test done given reports of chronic fatigue, snoring, I suspect that she has sleep apnea that has not been diagnosed  I have also placed her on Clarinex and lieu of the Xyzal which may be contributing to some of the daytime sleepiness symptoms.  She is to follow-up in 6 weeks for her next diabetes checkup and to follow-up on the aforementioned treatment plan   Colum Colt Hulen Skains, DO [facility] [phone]

## 2023-09-15 ENCOUNTER — Other Ambulatory Visit: Payer: Self-pay | Admitting: Family Medicine

## 2023-09-15 DIAGNOSIS — E1169 Type 2 diabetes mellitus with other specified complication: Secondary | ICD-10-CM

## 2023-09-17 NOTE — Telephone Encounter (Signed)
 [place] NTBS for 6 week FU RF sent to pharmacy

## 2023-09-17 NOTE — Telephone Encounter (Signed)
Appt. May 28 th

## 2023-09-28 ENCOUNTER — Other Ambulatory Visit: Payer: Self-pay

## 2023-10-01 DIAGNOSIS — D485 Neoplasm of uncertain behavior of skin: Secondary | ICD-10-CM | POA: Diagnosis not present

## 2023-10-03 ENCOUNTER — Encounter: Payer: Self-pay | Admitting: Family Medicine

## 2023-10-03 ENCOUNTER — Ambulatory Visit: Admitting: Physician Assistant

## 2023-10-12 DIAGNOSIS — G4733 Obstructive sleep apnea (adult) (pediatric): Secondary | ICD-10-CM | POA: Diagnosis not present

## 2023-10-16 ENCOUNTER — Ambulatory Visit: Payer: Self-pay

## 2023-10-16 ENCOUNTER — Telehealth: Payer: Self-pay | Admitting: Pharmacist

## 2023-10-16 DIAGNOSIS — E118 Type 2 diabetes mellitus with unspecified complications: Secondary | ICD-10-CM

## 2023-10-16 MED ORDER — TRESIBA FLEXTOUCH 200 UNIT/ML ~~LOC~~ SOPN: PEN_INJECTOR | SUBCUTANEOUS | 5 refills | Status: DC

## 2023-10-16 NOTE — Telephone Encounter (Signed)
 Please let patient know: Tresiba  U-200 was called in to replace Semglee  Dose would be the same 70 units daily She will likely benefit from this more concentrated insulin  (better absorption)

## 2023-10-16 NOTE — Telephone Encounter (Signed)
 Patient states she is out of semglee  and waiting to hear back from pharmacy in [place] if they will have any.  Would like to know what she should do in the mean time since she is not sure when she will get medication.  Will send to [facility] Deed to advise since Dr. Crissie Dome is out all week

## 2023-10-16 NOTE — Telephone Encounter (Signed)
 Patient aware and verbalizes understanding.

## 2023-10-16 NOTE — Telephone Encounter (Signed)
 Copied from CRM [id number]. Topic: Clinical - Prescription Issue >> Oct 16, 2023 11:10 AM Wynona Hedger wrote: Reason for CRM: Patient states none of the pharmacies around have the insulin  that she needs. Please call patient at [phone]

## 2023-10-22 ENCOUNTER — Ambulatory Visit: Payer: Self-pay | Admitting: Family Medicine

## 2023-10-23 ENCOUNTER — Other Ambulatory Visit: Payer: Self-pay | Admitting: Obstetrics and Gynecology

## 2023-10-23 DIAGNOSIS — N3281 Overactive bladder: Secondary | ICD-10-CM

## 2023-10-23 DIAGNOSIS — R351 Nocturia: Secondary | ICD-10-CM

## 2023-10-24 ENCOUNTER — Other Ambulatory Visit: Payer: Self-pay | Admitting: Family Medicine

## 2023-10-24 DIAGNOSIS — E118 Type 2 diabetes mellitus with unspecified complications: Secondary | ICD-10-CM

## 2023-10-24 DIAGNOSIS — E1169 Type 2 diabetes mellitus with other specified complication: Secondary | ICD-10-CM

## 2023-10-24 MED ORDER — INSULIN GLARGINE-YFGN 100 UNIT/ML ~~LOC~~ SOPN: 70.0000 [IU] | PEN_INJECTOR | Freq: Every day | SUBCUTANEOUS | 4 refills | Status: DC

## 2023-10-25 ENCOUNTER — Encounter: Payer: Self-pay | Admitting: Physician Assistant

## 2023-10-25 ENCOUNTER — Ambulatory Visit: Admitting: Physician Assistant

## 2023-10-25 VITALS — BP 106/64 | HR 88 | Ht 64.0 in | Wt 185.5 lb

## 2023-10-25 DIAGNOSIS — K3184 Gastroparesis: Secondary | ICD-10-CM

## 2023-10-25 DIAGNOSIS — K219 Gastro-esophageal reflux disease without esophagitis: Secondary | ICD-10-CM | POA: Diagnosis not present

## 2023-10-25 DIAGNOSIS — K5900 Constipation, unspecified: Secondary | ICD-10-CM | POA: Diagnosis not present

## 2023-10-25 MED ORDER — OMEPRAZOLE 20 MG PO CPDR: 20.0000 mg | DELAYED_RELEASE_CAPSULE | Freq: Every day | ORAL | 3 refills | Status: AC

## 2023-10-25 NOTE — Patient Instructions (Addendum)
   For constipation: Start OTC Miralax Powder Mix 1 capful in 6 to 8 ounces of a drink once daily  Recommend high-fiber diet, 30 g of fiber daily Eat fruits, vegetables, and whole grains Drink 64 ounces of water / fluids daily.   We have sent the following medications to your pharmacy for you to pick up at your convenience: Omeprazole  20 mg daily   Please follow up sooner if symptoms increase or worsen  Due to recent changes in healthcare laws, you may see the results of your imaging and laboratory studies on MyChart before your provider has had a chance to review them.  We understand that in some cases there may be results that are confusing or concerning to you. Not all laboratory results come back in the same time frame and the provider may be waiting for multiple results in order to interpret others.  Please give us  48 hours in order for your provider to thoroughly review all the results before contacting the office for clarification of your results.   _______________________________________________________  If your blood pressure at your visit was 140/90 or greater, please contact your primary care physician to follow up on this.  _______________________________________________________  If you are age 19 or older, your body mass index should be between 23-30. Your Body mass index is 31.84 kg/m. If this is out of the aforementioned range listed, please consider follow up with your Primary Care Provider.  If you are age 15 or younger, your body mass index should be between 19-25. Your Body mass index is 31.84 kg/m. If this is out of the aformentioned range listed, please consider follow up with your Primary Care Provider.   ________________________________________________________  The Lead GI providers would like to encourage you to use MYCHART to communicate with providers for non-urgent requests or questions.  Due to long hold times on the telephone, sending your provider a  message by [facility] may be a faster and more efficient way to get a response.  Please allow 48 business hours for a response.  Please remember that this is for non-urgent requests.  _______________________________________________________ Thank you for trusting me with your gastrointestinal care!   Brigitte Canard, PA-C

## 2023-10-25 NOTE — Progress Notes (Signed)
 Agree with assessment and plan as outlined.

## 2023-10-25 NOTE — Progress Notes (Signed)
 Brigitte Canard, PA-C [address] [place], [place]  [address] Phone: [phone]   Primary Care Physician: Eliodoro Guerin, DO  Primary Gastroenterologist:  Brigitte Canard, PA-C / Alvester Johnson, MD   Chief Complaint: Medication follow-up; Gastroparesis       HPI:   Carrie [name] is a 63 y.o. female, established patient Dr. General Kenner, presents for follow-up of diabetic gastroparesis.  She last followed up in our office 03/2023.  She was taking metoclopramide  5 Mg twice daily, however she weaned off this medication in the past 6 months.  No longer taking Reglan .  She has had 3 episodes of nausea with vomiting in the past 6 months.  Episodes have improved and are a lot less frequent.  Currently she is doing well.  She is taking omeprazole  20 Mg once daily with good control of acid reflux and needs refill of omeprazole .  She admits to constipation and has bowel movement every 2 to 3 days.  No current treatment.  No other GI concerns today.  Previous test showed retained food on upper endoscopy and delayed gastric emptying on gastric emptying study.  She has been on Ozempic  for diabetes for 2 years.  11/2021 EGD: LA grade a esophagitis at GEJ.  Normal stomach and duodenum.  11/2021 colonoscopy: 1 small 3 mm tubular adenoma polyp removed from sigmoid colon.  Medium size lipoma in the cecum.  Small internal hemorrhoids.  Good prep.  7-year repeat.  Current Outpatient Medications  Medication Sig Dispense Refill   aspirin  EC 81 MG tablet Take 81 mg by mouth daily. Swallow whole.     atorvastatin  (LIPITOR) 10 MG tablet Take 1 tablet (10 mg total) by mouth daily. 90 tablet 3   cariprazine  (VRAYLAR ) 1.5 MG capsule Take 1 capsule (1.5 mg total) by mouth daily. 90 capsule 0   cholecalciferol (VITAMIN D3) 25 MCG (1000 UNIT) tablet Take 1,000 Units by mouth daily.     clobetasol ointment (TEMOVATE) 0.05 % Apply 1 Application topically as needed.     Continuous Glucose Sensor (FREESTYLE  LIBRE 3 PLUS SENSOR) MISC Change sensor every 15 days.     desloratadine  (CLARINEX ) 5 MG tablet Take 1 tablet (5 mg total) by mouth daily. For allergy 90 tablet 3   desvenlafaxine  (PRISTIQ ) 100 MG 24 hr tablet Take 1 tablet (100 mg total) by mouth daily. 90 tablet 3   insulin  glargine-yfgn (SEMGLEE ) 100 UNIT/ML Pen Inject 70 Units into the skin at bedtime. Please order for patient 60 mL 4   Insulin  Pen Needle (RELION PEN NEEDLES) 32G X 4 MM MISC USE TO INJECT MEDICATION ONCE DAILY Dx 10.9 100 each 0   JARDIANCE  25 MG TABS tablet TAKE 1 TABLET BY MOUTH BEFORE BREAKFAST 90 tablet 0   losartan -hydrochlorothiazide (HYZAAR) 50-12.5 MG tablet Take 1 tablet by mouth daily.     ondansetron  (ZOFRAN -ODT) 4 MG disintegrating tablet Take 1 tablet (4 mg total) by mouth every 8 (eight) hours as needed for nausea or vomiting. 20 tablet 0   OZEMPIC , 2 MG/DOSE, 8 MG/3ML SOPN INJECT 2 MG SUBCUTANEOUSLY ONCE A WEEK 9 mL 0   SUMAtriptan  (IMITREX ) 100 MG tablet May repeat in 2 hours if headache persists or recurs. 10 tablet 3   Trospium  Chloride 60 MG CP24 Take 1 capsule by mouth once daily 30 capsule 8   valACYclovir  (VALTREX ) 1000 MG tablet Take 1,000 mg by mouth 2 (two) times daily.     YUVAFEM 10 MCG TABS vaginal  tablet Place vaginally.     metoCLOPramide  (REGLAN ) 5 MG tablet Take 1 tablet (5 mg total) by mouth in the morning and at bedtime. Please keep your upcoming appointment for any further refills. Thank you (Patient not taking: Reported on 10/25/2023) 90 tablet 6   omeprazole  (PRILOSEC) 20 MG capsule Take 1 capsule (20 mg total) by mouth daily. TAKE 1 CAPSULE BY MOUTH ONCE DAILY 30-60 MINUTES BEFORE BREAKFAST AS NEEDED. 90 capsule 3   No current facility-administered medications for this visit.    Allergies as of 10/25/2023 - Review Complete 10/25/2023  Allergen Reaction Noted   Metformin  hcl  07/25/2022    Past Medical History:  Diagnosis Date   Depression    Diabetes mellitus without complication  (HCC)    Fever blister    Gastroparesis    Hyperlipidemia    Hypertension    Migraine     Past Surgical History:  Procedure Laterality Date   BACK SURGERY     CHOLECYSTECTOMY  2004    Review of Systems:    All systems reviewed and negative except where noted in HPI.    Physical Exam:  BP 106/64   Pulse 88   Ht 5\' 4"  (1.626 m)   Wt 185 lb 8 oz (84.1 kg)   SpO2 95%   BMI 31.84 kg/m  No LMP recorded. Patient is postmenopausal.  General: Well-nourished, well-developed in no acute distress.  Neuro: Alert and oriented x 3.  Grossly intact.  Psych: Alert and cooperative, normal mood and affect.   Imaging Studies: No results found.  Labs: CBC    Component Value Date/Time   WBC 8.4 08/02/2023 0830   WBC 10.3 07/31/2021 0520   RBC 4.78 08/02/2023 0830   RBC 3.76 (L) 07/31/2021 0520   HGB 14.8 08/02/2023 0830   HCT 44.4 08/02/2023 0830   PLT 512 (H) 08/02/2023 0830   MCV 93 08/02/2023 0830   MCH 31.0 08/02/2023 0830   MCH 32.4 07/31/2021 0520   MCHC 33.3 08/02/2023 0830   MCHC 33.1 07/31/2021 0520   RDW 12.0 08/02/2023 0830   LYMPHSABS 1.1 07/29/2021 1111   LYMPHSABS 2.4 03/18/2020 0916   MONOABS 1.2 (H) 07/29/2021 1111   EOSABS 0.0 07/29/2021 1111   EOSABS 0.3 03/18/2020 0916   BASOSABS 0.1 07/29/2021 1111   BASOSABS 0.1 03/18/2020 0916    CMP     Component Value Date/Time   NA 140 08/02/2023 0830   K 4.1 08/02/2023 0830   CL 99 08/02/2023 0830   CO2 22 08/02/2023 0830   GLUCOSE 126 (H) 08/02/2023 0830   GLUCOSE 124 (H) 08/01/2021 0523   BUN 15 08/02/2023 0830   CREATININE 0.78 08/02/2023 0830   CREATININE 0.78 07/04/2019 1439   CALCIUM  9.5 08/02/2023 0830   PROT 6.7 08/02/2023 0830   ALBUMIN 4.4 08/02/2023 0830   AST 24 08/02/2023 0830   AST 17 07/04/2019 1439   ALT 23 08/02/2023 0830   ALT 19 07/04/2019 1439   ALKPHOS 91 08/02/2023 0830   BILITOT 0.8 08/02/2023 0830   BILITOT 0.7 07/04/2019 1439   GFRNONAA >60 08/01/2021 0523   GFRNONAA >60  07/04/2019 1439   GFRAA 95 11/17/2019 1048   GFRAA >60 07/04/2019 1439       Assessment and Plan:   Carrie [name] is a 63 y.o. y/o female presents for followup of:  Gastroparesis - She has weaned off and discontinued oral Reglan . She is currently doing well off Reglan . - Patient education discussed at  length.  I advised patient about adverse side effects of Reglan  to include QT prolongation, arrhythmias, and tardive dyskinesia.  Patient expressed understanding.  She will remain off Reglan  for now.  She can take it sparingly 5mg  2 times daily if she has a severe flare up of nausea / vomiting. -  Dr. General Kenner suggested metoclopramide  nasal spray (Gimoti ) which does not have any risk for QT prolongation.  No reported cases of tardive dyskinesia.  Safer long-term if covered by insurance. --We discussed Intranasal Reglan , and patient declined, as she is currently feeling better. - Gastroparesis Diet Given. - If she has flare up of GI symptoms, we discussed decreasing Ozempic  dose (she will discuss with her PCP).  GERD - Rx Omeprazole  20mg  1 tablet once daily, #90, 3 RF. - Recommend Lifestyle Modifications to prevent Acid Reflux.  Rec. Avoid coffee, sodas, peppermint, garlic, onions, alcohol, citrus fruits, chocolate, tomatoes, fatty and spicey foods.  Avoid eating 2-3 hours before bedtime.    Constipation - Start OTC Miralax Powder, Mix 1 capful in 6 to 8 ounces of a drink once daily - Recommend high-fiber diet, 30 g of fiber daily - Eat fruits, vegetables, and whole grains - Drink 64 ounces of water / fluids daily.   Brigitte Canard, PA-C  Follow up in 1 year or sooner if she has recurrent GI symptoms.

## 2023-10-30 ENCOUNTER — Other Ambulatory Visit: Payer: Self-pay | Admitting: Family Medicine

## 2023-10-30 DIAGNOSIS — E1169 Type 2 diabetes mellitus with other specified complication: Secondary | ICD-10-CM

## 2023-10-31 ENCOUNTER — Encounter: Payer: Self-pay | Admitting: Family Medicine

## 2023-10-31 ENCOUNTER — Ambulatory Visit: Admitting: Family Medicine

## 2023-10-31 ENCOUNTER — Other Ambulatory Visit: Payer: Self-pay | Admitting: Family Medicine

## 2023-10-31 VITALS — BP 99/66 | HR 88 | Temp 98.7°F | Ht 64.0 in | Wt 183.0 lb

## 2023-10-31 DIAGNOSIS — Z794 Long term (current) use of insulin: Secondary | ICD-10-CM | POA: Diagnosis not present

## 2023-10-31 DIAGNOSIS — E1169 Type 2 diabetes mellitus with other specified complication: Secondary | ICD-10-CM | POA: Diagnosis not present

## 2023-10-31 DIAGNOSIS — F331 Major depressive disorder, recurrent, moderate: Secondary | ICD-10-CM

## 2023-10-31 DIAGNOSIS — E785 Hyperlipidemia, unspecified: Secondary | ICD-10-CM

## 2023-10-31 DIAGNOSIS — E1159 Type 2 diabetes mellitus with other circulatory complications: Secondary | ICD-10-CM

## 2023-10-31 DIAGNOSIS — I152 Hypertension secondary to endocrine disorders: Secondary | ICD-10-CM

## 2023-10-31 DIAGNOSIS — E119 Type 2 diabetes mellitus without complications: Secondary | ICD-10-CM | POA: Diagnosis not present

## 2023-10-31 LAB — BAYER DCA HB A1C WAIVED: HB A1C (BAYER DCA - WAIVED): 7.3 % — ABNORMAL HIGH (ref 4.8–5.6)

## 2023-10-31 MED ORDER — TIRZEPATIDE 7.5 MG/0.5ML ~~LOC~~ SOAJ: 7.5000 mg | SUBCUTANEOUS | 2 refills | Status: DC

## 2023-10-31 MED ORDER — TIRZEPATIDE 10 MG/0.5ML ~~LOC~~ SOAJ: 10.0000 mg | SUBCUTANEOUS | 2 refills | Status: DC

## 2023-10-31 MED ORDER — DEXCOM G7 SENSOR MISC: 4 refills | Status: AC

## 2023-10-31 MED ORDER — TIRZEPATIDE 12.5 MG/0.5ML ~~LOC~~ SOAJ: 12.5000 mg | SUBCUTANEOUS | 2 refills | Status: DC

## 2023-10-31 NOTE — Progress Notes (Signed)
 Subjective: CC:DM2, MDD, GAD PCP: Eliodoro Guerin, DO RSW:Carrie [name] is a 63 y.o. female presenting to clinic today for:  1. Type 2 Diabetes with hypertension, hyperlipidemia:  Glucometer:samples of Freestyle libre given last OV.   She reports overall blood sugars have been looking pretty good.  She has been trying to reduce her portion sizes but is not really changing much of what she eats.  She continues not to exercise.  Want to see about switching to the Dexcom 7 because this is better covered by her insurance  Last eye exam: UTD Last foot exam: needs Last A1c:  Lab Results  Component Value Date   HGBA1C 7.1 (H) 08/02/2023   Nephropathy screen indicated?: UTD Last flu, zoster and/or pneumovax:  Immunization History  Administered Date(s) Administered   Influenza,inj,Quad PF,6+ Mos 07/07/2016, 03/28/2017, 04/02/2018, 04/07/2019, 05/11/2021, 03/20/2023   Influenza-Unspecified 04/17/2016   Moderna Sars-Covid-2 Vaccination 08/11/2019, 09/09/2019, 06/11/2020   PNEUMOCOCCAL CONJUGATE-20 05/11/2021   Pneumococcal Polysaccharide-23 01/23/2019   Tdap 01/23/2019   Zoster Recombinant(Shingrix) 11/16/2020, 06/02/2021    ROS: Denies dizziness, LOC, polyuria, polydipsia, unintended weight loss/gain, foot ulcerations, numbness or tingling in extremities, shortness of breath or chest pain.   2. MDD, GAD Has appt for counseling with Peggy 12/12/2023. Sleep study was normal.  She was switched from Xyzal  to Clarinex  last OV and reports fatigue has been fair.  She is overall feeling better from mental health standpoint.  She is pleased with her medications currently   ROS: Per HPI  Allergies  Allergen Reactions   Metformin  Hcl     Other Reaction(s): upset stomach   Past Medical History:  Diagnosis Date   Depression    Diabetes mellitus without complication (HCC)    Fever blister    Gastroparesis    Hyperlipidemia    Hypertension    Migraine     Current Outpatient  Medications:    aspirin  EC 81 MG tablet, Take 81 mg by mouth daily. Swallow whole., Disp: , Rfl:    atorvastatin  (LIPITOR) 10 MG tablet, Take 1 tablet by mouth once daily, Disp: 90 tablet, Rfl: 0   cariprazine  (VRAYLAR ) 1.5 MG capsule, Take 1 capsule (1.5 mg total) by mouth daily., Disp: 90 capsule, Rfl: 0   cholecalciferol (VITAMIN D3) 25 MCG (1000 UNIT) tablet, Take 1,000 Units by mouth daily., Disp: , Rfl:    clobetasol ointment (TEMOVATE) 0.05 %, Apply 1 Application topically as needed., Disp: , Rfl:    Continuous Glucose Sensor (FREESTYLE LIBRE 3 PLUS SENSOR) MISC, Change sensor every 15 days., Disp: , Rfl:    desloratadine  (CLARINEX ) 5 MG tablet, Take 1 tablet (5 mg total) by mouth daily. For allergy, Disp: 90 tablet, Rfl: 3   desvenlafaxine  (PRISTIQ ) 100 MG 24 hr tablet, Take 1 tablet (100 mg total) by mouth daily., Disp: 90 tablet, Rfl: 3   insulin  glargine-yfgn (SEMGLEE ) 100 UNIT/ML Pen, Inject 70 Units into the skin at bedtime. Please order for patient, Disp: 60 mL, Rfl: 4   Insulin  Pen Needle (RELION PEN NEEDLES) 32G X 4 MM MISC, USE TO INJECT MEDICATION ONCE DAILY Dx 10.9, Disp: 100 each, Rfl: 0   JARDIANCE  25 MG TABS tablet, TAKE 1 TABLET BY MOUTH BEFORE BREAKFAST, Disp: 90 tablet, Rfl: 0   losartan -hydrochlorothiazide (HYZAAR) 50-12.5 MG tablet, Take 1 tablet by mouth daily., Disp: , Rfl:    metoCLOPramide  (REGLAN ) 5 MG tablet, Take 1 tablet (5 mg total) by mouth in the morning and at bedtime. Please keep your upcoming  appointment for any further refills. Thank you (Patient not taking: Reported on 10/25/2023), Disp: 90 tablet, Rfl: 6   omeprazole  (PRILOSEC) 20 MG capsule, Take 1 capsule (20 mg total) by mouth daily. TAKE 1 CAPSULE BY MOUTH ONCE DAILY 30-60 MINUTES BEFORE BREAKFAST AS NEEDED., Disp: 90 capsule, Rfl: 3   ondansetron  (ZOFRAN -ODT) 4 MG disintegrating tablet, Take 1 tablet (4 mg total) by mouth every 8 (eight) hours as needed for nausea or vomiting., Disp: 20 tablet, Rfl: 0    OZEMPIC , 2 MG/DOSE, 8 MG/3ML SOPN, INJECT 2 MG SUBCUTANEOUSLY ONCE A WEEK, Disp: 9 mL, Rfl: 0   SUMAtriptan  (IMITREX ) 100 MG tablet, May repeat in 2 hours if headache persists or recurs., Disp: 10 tablet, Rfl: 3   Trospium  Chloride 60 MG CP24, Take 1 capsule by mouth once daily, Disp: 30 capsule, Rfl: 8   valACYclovir  (VALTREX ) 1000 MG tablet, Take 1,000 mg by mouth 2 (two) times daily., Disp: , Rfl:    YUVAFEM 10 MCG TABS vaginal tablet, Place vaginally., Disp: , Rfl:  Social History   Socioeconomic History   Marital status: Married    Spouse name: Not on file   Number of children: 2   Years of education: Not on file   Highest education level: Not on file  Occupational History   Occupation: customer service  Tobacco Use   Smoking status: Former    Current packs/day: 0.00    Average packs/day: 0.5 packs/day for 40.0 years (20.0 ttl pk-yrs)    Types: Cigarettes    Start date: 62    Quit date: 2006    Years since quitting: 19.4   Smokeless tobacco: Never  Vaping Use   Vaping status: Never Used  Substance and Sexual Activity   Alcohol use: Yes    Comment: occasional    Drug use: No   Sexual activity: Yes    Birth control/protection: Post-menopausal  Other Topics Concern   Not on file  Social History Narrative   .      Right handed    Lives with husband    Social Drivers of Corporate investment banker Strain: Not on file  Food Insecurity: Not on file  Transportation Needs: Not on file  Physical Activity: Not on file  Stress: Not on file  Social Connections: Unknown (10/18/2021)   Received from [facility], [facility]   Social Network    Social Network: Not on file  Intimate Partner Violence: Unknown (09/09/2021)   [facility] from [facility], [facility]   HITS    Physically Hurt: Not on file    Insult or Talk Down To: Not on file    Threaten Physical Harm: Not on file    Scream or Curse: Not on file   Family History  Problem Relation Age of Onset    Ovarian cancer Mother    Mental illness Daughter    Migraines Daughter    Colon cancer Neg Hx    Rectal cancer Neg Hx    Throat cancer Neg Hx     Objective: Office vital signs reviewed. BP 99/66   Pulse 88   Temp 98.7 F (37.1 C)   Ht 5\' 4"  (1.626 m)   Wt 183 lb (83 kg)   SpO2 94%   BMI 31.41 kg/m   Physical Examination:  General: Awake, alert, obese, No acute distress HEENT: sclera white, MMM Cardio: regular rate and rhythm, S1S2 heard, no murmurs appreciated Pulm: clear to auscultation bilaterally, no wheezes, rhonchi or rales;  normal work of breathing on room air Psych: mood stable, speech normal     10/31/2023    2:36 PM 09/13/2023    7:59 AM 08/02/2023    8:36 AM  Depression screen PHQ 2/9  Decreased Interest 1 1 0  Down, Depressed, Hopeless 0 0 2  PHQ - 2 Score 1 1 2   Altered sleeping 1 3 3   Tired, decreased energy 1 3 1   Change in appetite 1 3 1   Feeling bad or failure about yourself  0 2 2  Trouble concentrating 0 2 0  Moving slowly or fidgety/restless 0 0 0  Suicidal thoughts 0 0 0  PHQ-9 Score 4 14 9   Difficult doing work/chores Somewhat difficult Very difficult       10/31/2023    2:36 PM 09/13/2023    8:00 AM 08/02/2023    8:36 AM 11/22/2022    4:23 PM  GAD 7 : Generalized Anxiety Score  Nervous, Anxious, on Edge 0 2 1 1   Control/stop worrying 1 1 1 1   Worry too much - different things 0 1 1 1   Trouble relaxing 0 1 1 2   Restless 0 1 1 0  Easily annoyed or irritable 1 2 1  0  Afraid - awful might happen 0 0 1   Total GAD 7 Score 2 8 7    Anxiety Difficulty Somewhat difficult Somewhat difficult Somewhat difficult Somewhat difficult   Diabetic Foot Exam - Simple   Simple Foot Form Diabetic Foot exam was performed with the following findings: Yes 10/31/2023  3:00 PM  Visual Inspection No deformities, no ulcerations, no other skin breakdown bilaterally: Yes Sensation Testing See comments: Yes Pulse Check Posterior Tibialis and Dorsalis pulse intact  bilaterally: Yes Comments Absent monofilament sensation on the MTP plantar aspects bilaterally.  She has absent monofilament sensation the left great toe as well      Assessment/ Plan: 63 y.o. female   Type 2 diabetes mellitus with other specified complication, with long-term current use of insulin  (HCC) - Plan: Bayer DCA Hb A1c Waived, Continuous Glucose Sensor (DEXCOM G7 SENSOR) MISC, tirzepatide  (MOUNJARO ) 7.5 MG/0.5ML Pen, tirzepatide  (MOUNJARO ) 10 MG/0.5ML Pen, tirzepatide  (MOUNJARO ) 12.5 MG/0.5ML Pen  Hypertension associated with diabetes (HCC)  Hyperlipidemia associated with type 2 diabetes mellitus (HCC)  Moderate episode of recurrent major depressive disorder (HCC)  Sugar not at goal and sadly rising to A1c of 7.3.  She wants to try going back on Mounjaro  to see if she can get better control of her appetite and reduce weight and subsequently blood sugar.  She does suffer from gastroparesis so I have asked that she be very cautious while going on to the Mounjaro .  Going to start her only at 7.5 mg, she was previously maxed out on 10 mg.  She may advance each month as tolerated  Blood pressure controlled.  Continue current regimen with statin  Mood controlled. Keep counseling appt.   Eliodoro Guerin, DO [facility] [phone]

## 2023-11-05 ENCOUNTER — Other Ambulatory Visit (HOSPITAL_COMMUNITY): Payer: Self-pay

## 2023-11-05 MED ORDER — INSULIN GLARGINE-YFGN 100 UNIT/ML ~~LOC~~ SOPN
70.0000 [IU] | PEN_INJECTOR | Freq: Every day | SUBCUTANEOUS | 2 refills | Status: DC
  Filled 2023-11-05: qty 15, 21d supply, fill #0

## 2023-11-06 ENCOUNTER — Telehealth: Payer: Self-pay | Admitting: Pharmacist

## 2023-11-06 ENCOUNTER — Other Ambulatory Visit (HOSPITAL_COMMUNITY): Payer: Self-pay

## 2023-11-06 ENCOUNTER — Other Ambulatory Visit: Payer: Self-pay

## 2023-11-06 DIAGNOSIS — E118 Type 2 diabetes mellitus with unspecified complications: Secondary | ICD-10-CM

## 2023-11-06 MED ORDER — INSULIN GLARGINE-YFGN 100 UNIT/ML ~~LOC~~ SOPN
70.0000 [IU] | PEN_INJECTOR | Freq: Every day | SUBCUTANEOUS | 4 refills | Status: DC
  Filled 2023-11-06: qty 45, 64d supply, fill #0
  Filled 2023-12-29: qty 30, 42d supply, fill #1
  Filled 2024-02-16: qty 30, 43d supply, fill #2
  Filled 2024-03-31 – 2024-04-09 (×2): qty 30, 43d supply, fill #3

## 2023-11-06 NOTE — Telephone Encounter (Signed)
 Reviewed insulin  regimen with patient (70 units nightly) Semglee  has been on backorder (likely through July/August) She has 1 pen remaining at home [facility] long has Semglee  in stock 2 month supply of Semglee  sent to [facility] outpatient pharmacy Patient to get copay card and present at the pharmacy  Marvell Slider, PharmD, BCACP, CPP Clinical Pharmacist, [facility]

## 2023-11-07 ENCOUNTER — Other Ambulatory Visit (HOSPITAL_COMMUNITY): Payer: Self-pay

## 2023-11-19 ENCOUNTER — Other Ambulatory Visit: Payer: Self-pay | Admitting: Family Medicine

## 2023-11-19 DIAGNOSIS — F331 Major depressive disorder, recurrent, moderate: Secondary | ICD-10-CM

## 2023-11-22 ENCOUNTER — Other Ambulatory Visit: Payer: Self-pay | Admitting: Family Medicine

## 2023-11-22 DIAGNOSIS — E1169 Type 2 diabetes mellitus with other specified complication: Secondary | ICD-10-CM

## 2023-12-12 ENCOUNTER — Ambulatory Visit (HOSPITAL_COMMUNITY): Admitting: Psychiatry

## 2023-12-29 ENCOUNTER — Other Ambulatory Visit (HOSPITAL_COMMUNITY): Payer: Self-pay

## 2024-01-05 ENCOUNTER — Other Ambulatory Visit: Payer: Self-pay

## 2024-01-19 ENCOUNTER — Other Ambulatory Visit: Payer: Self-pay | Admitting: Family Medicine

## 2024-01-19 DIAGNOSIS — E1169 Type 2 diabetes mellitus with other specified complication: Secondary | ICD-10-CM

## 2024-01-25 ENCOUNTER — Other Ambulatory Visit: Payer: Self-pay | Admitting: Family Medicine

## 2024-01-25 DIAGNOSIS — E1169 Type 2 diabetes mellitus with other specified complication: Secondary | ICD-10-CM

## 2024-02-05 ENCOUNTER — Ambulatory Visit: Admitting: Family Medicine

## 2024-02-05 ENCOUNTER — Encounter: Payer: Self-pay | Admitting: Family Medicine

## 2024-02-05 VITALS — BP 101/74 | HR 97 | Temp 97.8°F | Ht 64.0 in | Wt 185.4 lb

## 2024-02-05 DIAGNOSIS — E1169 Type 2 diabetes mellitus with other specified complication: Secondary | ICD-10-CM

## 2024-02-05 DIAGNOSIS — E785 Hyperlipidemia, unspecified: Secondary | ICD-10-CM

## 2024-02-05 DIAGNOSIS — E1159 Type 2 diabetes mellitus with other circulatory complications: Secondary | ICD-10-CM

## 2024-02-05 DIAGNOSIS — I152 Hypertension secondary to endocrine disorders: Secondary | ICD-10-CM

## 2024-02-05 DIAGNOSIS — F331 Major depressive disorder, recurrent, moderate: Secondary | ICD-10-CM

## 2024-02-05 DIAGNOSIS — Z794 Long term (current) use of insulin: Secondary | ICD-10-CM | POA: Diagnosis not present

## 2024-02-05 MED ORDER — CARIPRAZINE HCL 1.5 MG PO CAPS: 1.5000 mg | ORAL_CAPSULE | Freq: Every day | ORAL | 3 refills | Status: AC

## 2024-02-05 NOTE — Progress Notes (Signed)
 Subjective: CC:DM PCP: Carrie Carrie HERO, DO YEP:Carrie [name] is a 63 y.o. female presenting to clinic today for:  Discussed the use of AI scribe software for clinical note transcription with the patient, who gave verbal consent to proceed.  History of Present Illness   Carrie [name] is a 63 year old female with diabetes who presents for medication management and follow-up.  She is currently on Ozempic  for diabetes management but is considering switching to Mounjaro  due to better weight loss outcomes and fewer gastrointestinal side effects. She is using a continuous glucose monitor and her last average reading was 7.3. She is not wearing the monitor at the moment as it expired today. No vaginal symptoms associated with Jardiance  use.  Regarding her glucose monitoring devices, she finds the G7 more affordable and effective compared to the [place]. She pays $35 for the G7 sensors and is open to receiving samples if available.  She is also taking Vraylar  for depression and requires a refill.  Reports good control of symptoms with SNRI and Vraylar .        Diabetes Health Maintenance Due  Topic Date Due   OPHTHALMOLOGY EXAM  02/09/2024   HEMOGLOBIN A1C  05/02/2024   FOOT EXAM  10/30/2024    ROS: Per HPI  Allergies  Allergen Reactions   Metformin  Hcl     Other Reaction(s): upset stomach   Past Medical History:  Diagnosis Date   Depression    Diabetes mellitus without complication (HCC)    Fever blister    Gastroparesis    Hyperlipidemia    Hypertension    Migraine     Current Outpatient Medications:    aspirin  EC 81 MG tablet, Take 81 mg by mouth daily. Swallow whole., Disp: , Rfl:    atorvastatin  (LIPITOR) 10 MG tablet, Take 1 tablet by mouth once daily, Disp: 90 tablet, Rfl: 0   cholecalciferol (VITAMIN D3) 25 MCG (1000 UNIT) tablet, Take 1,000 Units by mouth daily., Disp: , Rfl:    Continuous Glucose Sensor (DEXCOM G7 SENSOR) MISC, Use to continuously monitor sugars.  Change every 10 days E11.69, Disp: 9 each, Rfl: 4   insulin  glargine-yfgn (SEMGLEE ) 100 UNIT/ML Pen, Inject 70 Units into the skin at bedtime., Disp: 45 mL, Rfl: 4   Insulin  Pen Needle (RELION PEN NEEDLES) 32G X 4 MM MISC, USE TO INJECT MEDICATION ONCE DAILY Dx 10.9, Disp: 100 each, Rfl: 0   JARDIANCE  25 MG TABS tablet, TAKE 1 TABLET BY MOUTH BEFORE BREAKFAST, Disp: 90 tablet, Rfl: 0   losartan -hydrochlorothiazide (HYZAAR) 50-12.5 MG tablet, Take 1 tablet by mouth daily., Disp: , Rfl:    omeprazole  (PRILOSEC) 20 MG capsule, Take 1 capsule (20 mg total) by mouth daily. TAKE 1 CAPSULE BY MOUTH ONCE DAILY 30-60 MINUTES BEFORE BREAKFAST AS NEEDED., Disp: 90 capsule, Rfl: 3   ondansetron  (ZOFRAN -ODT) 4 MG disintegrating tablet, Take 1 tablet (4 mg total) by mouth every 8 (eight) hours as needed for nausea or vomiting., Disp: 20 tablet, Rfl: 0   SUMAtriptan  (IMITREX ) 100 MG tablet, May repeat in 2 hours if headache persists or recurs., Disp: 10 tablet, Rfl: 3   valACYclovir  (VALTREX ) 1000 MG tablet, Take 1,000 mg by mouth 2 (two) times daily., Disp: , Rfl:    cariprazine  (VRAYLAR ) 1.5 MG capsule, Take 1 capsule (1.5 mg total) by mouth daily., Disp: 90 capsule, Rfl: 3   clobetasol ointment (TEMOVATE) 0.05 %, Apply 1 Application topically as needed. (Patient not taking: Reported on 02/05/2024), Disp: ,  Rfl:    desloratadine  (CLARINEX ) 5 MG tablet, Take 1 tablet (5 mg total) by mouth daily. For allergy (Patient not taking: Reported on 02/05/2024), Disp: 90 tablet, Rfl: 3   desvenlafaxine  (PRISTIQ ) 100 MG 24 hr tablet, Take 1 tablet by mouth once daily (Patient not taking: Reported on 02/05/2024), Disp: 90 tablet, Rfl: 0   metoCLOPramide  (REGLAN ) 5 MG tablet, Take 1 tablet (5 mg total) by mouth in the morning and at bedtime. Please keep your upcoming appointment for any further refills. Thank you (Patient not taking: Reported on 02/05/2024), Disp: 90 tablet, Rfl: 6   tirzepatide  (MOUNJARO ) 10 MG/0.5ML Pen, Inject 10 mg  into the skin once a week. (Patient not taking: Reported on 02/05/2024), Disp: 2 mL, Rfl: 2   tirzepatide  (MOUNJARO ) 12.5 MG/0.5ML Pen, Inject 12.5 mg into the skin once a week. (Patient not taking: Reported on 02/05/2024), Disp: 2 mL, Rfl: 2   tirzepatide  (MOUNJARO ) 7.5 MG/0.5ML Pen, Inject 7.5 mg into the skin once a week. (Patient not taking: Reported on 02/05/2024), Disp: 2 mL, Rfl: 2   Trospium  Chloride 60 MG CP24, Take 1 capsule by mouth once daily (Patient not taking: Reported on 02/05/2024), Disp: 30 capsule, Rfl: 8   YUVAFEM 10 MCG TABS vaginal tablet, Place vaginally. (Patient not taking: Reported on 02/05/2024), Disp: , Rfl:  Social History   Socioeconomic History   Marital status: Married    Spouse name: Not on file   Number of children: 2   Years of education: Not on file   Highest education level: Not on file  Occupational History   Occupation: customer service  Tobacco Use   Smoking status: Former    Current packs/day: 0.00    Average packs/day: 0.5 packs/day for 40.0 years (20.0 ttl pk-yrs)    Types: Cigarettes    Start date: 19    Quit date: 2006    Years since quitting: 19.6   Smokeless tobacco: Never  Vaping Use   Vaping status: Never Used  Substance and Sexual Activity   Alcohol use: Yes    Comment: occasional    Drug use: No   Sexual activity: Yes    Birth control/protection: Post-menopausal  Other Topics Concern   Not on file  Social History Narrative   .      Right handed    Lives with husband    Social Drivers of Corporate investment banker Strain: Not on file  Food Insecurity: Not on file  Transportation Needs: Not on file  Physical Activity: Not on file  Stress: Not on file  Social Connections: Unknown (10/18/2021)   Received from [facility]   Social Network    Social Network: Not on file  Intimate Partner Violence: Unknown (09/09/2021)   Received from [facility]   HITS    Physically Hurt: Not on file    Insult or Talk Down To: Not on file     Threaten Physical Harm: Not on file    Scream or Curse: Not on file   Family History  Problem Relation Age of Onset   Ovarian cancer Mother    Mental illness Daughter    Migraines Daughter    Colon cancer Neg Hx    Rectal cancer Neg Hx    Throat cancer Neg Hx     Objective: Office vital signs reviewed. BP 101/74   Pulse 97   Temp 97.8 F (36.6 C)   Ht 5' 4 (1.626 m)   Wt 185 lb 6 oz (  84.1 kg)   SpO2 94%   BMI 31.82 kg/m   Physical Examination:  General: Awake, alert, obese, No acute distress HEENT: sclera white, MMM Cardio: regular rate and rhythm, S1S2 heard, no murmurs appreciated Pulm: clear to auscultation bilaterally, no wheezes, rhonchi or rales; normal work of breathing on room air       10/31/2023    2:36 PM 09/13/2023    7:59 AM 08/02/2023    8:36 AM  Depression screen PHQ 2/9  Decreased Interest 1 1 0  Down, Depressed, Hopeless 0 0 2  PHQ - 2 Score 1 1 2   Altered sleeping 1 3 3   Tired, decreased energy 1 3 1   Change in appetite 1 3 1   Feeling bad or failure about yourself  0 2 2  Trouble concentrating 0 2 0  Moving slowly or fidgety/restless 0 0 0  Suicidal thoughts 0 0 0  PHQ-9 Score 4 14 9   Difficult doing work/chores Somewhat difficult Very difficult       10/31/2023    2:36 PM 09/13/2023    8:00 AM 08/02/2023    8:36 AM 11/22/2022    4:23 PM  GAD 7 : Generalized Anxiety Score  Nervous, Anxious, on Edge 0 2 1 1   Control/stop worrying 1 1 1 1   Worry too much - different things 0 1 1 1   Trouble relaxing 0 1 1 2   Restless 0 1 1 0  Easily annoyed or irritable 1 2 1  0  Afraid - awful might happen 0 0 1   Total GAD 7 Score 2 8 7    Anxiety Difficulty Somewhat difficult Somewhat difficult Somewhat difficult Somewhat difficult     Lab Results  Component Value Date   HGBA1C 7.3 (H) 10/31/2023    Assessment/ Plan: 63 y.o. female   Type 2 diabetes mellitus with other specified complication, with long-term current use of insulin   (HCC)  Hypertension associated with diabetes (HCC)  Hyperlipidemia associated with type 2 diabetes mellitus (HCC)  Moderate episode of recurrent major depressive disorder (HCC) - Plan: cariprazine  (VRAYLAR ) 1.5 MG capsule  Assessment and Plan    Type 2 diabetes mellitus with HTN, HLD and obesity Transitioning to Mounjaro  for improved diabetic control, weight loss and tolerability.   - Discontinue Ozempic  - Initiate Mounjaro  at 7.5 mg starting Sunday. - Delay A1c testing until post-transition. - Samples of G7 CGM given.  Depression Managed with Pristiq  Vraylar , no issues reported. - Refill Vraylar  prescription.       Carrie CHRISTELLA Fielding, DO [facility] [phone]

## 2024-02-12 DIAGNOSIS — Z130 Encounter for screening for diseases of the blood and blood-forming organs and certain disorders involving the immune mechanism: Secondary | ICD-10-CM | POA: Diagnosis not present

## 2024-02-12 DIAGNOSIS — Z1231 Encounter for screening mammogram for malignant neoplasm of breast: Secondary | ICD-10-CM | POA: Diagnosis not present

## 2024-02-12 DIAGNOSIS — Z01419 Encounter for gynecological examination (general) (routine) without abnormal findings: Secondary | ICD-10-CM | POA: Diagnosis not present

## 2024-02-12 DIAGNOSIS — A6004 Herpesviral vulvovaginitis: Secondary | ICD-10-CM | POA: Diagnosis not present

## 2024-02-13 ENCOUNTER — Other Ambulatory Visit: Payer: Self-pay | Admitting: Family Medicine

## 2024-02-13 DIAGNOSIS — F331 Major depressive disorder, recurrent, moderate: Secondary | ICD-10-CM

## 2024-02-16 ENCOUNTER — Other Ambulatory Visit (HOSPITAL_COMMUNITY): Payer: Self-pay

## 2024-02-18 ENCOUNTER — Other Ambulatory Visit: Payer: Self-pay

## 2024-02-18 ENCOUNTER — Other Ambulatory Visit (HOSPITAL_COMMUNITY): Payer: Self-pay

## 2024-02-19 ENCOUNTER — Ambulatory Visit (HOSPITAL_COMMUNITY): Admitting: Psychiatry

## 2024-02-19 ENCOUNTER — Other Ambulatory Visit (HOSPITAL_COMMUNITY): Payer: Self-pay

## 2024-02-20 ENCOUNTER — Other Ambulatory Visit (HOSPITAL_COMMUNITY): Payer: Self-pay

## 2024-02-20 ENCOUNTER — Telehealth: Payer: Self-pay

## 2024-02-20 NOTE — Telephone Encounter (Signed)
 Copied from CRM [id number]. Topic: Clinical - Prescription Issue >> Feb 20, 2024 11:24 AM Avram MATSU wrote: Reason for CRM: patient stated she called different pharmacies to see if they have her medication insulin  glargine-yfgn (SEMGLEE ) 100 UNIT/ML Pen [[id number]] but stated every place is 2 months out. Please advise she is out of shots, [phone]

## 2024-02-21 ENCOUNTER — Other Ambulatory Visit (HOSPITAL_COMMUNITY): Payer: Self-pay

## 2024-02-22 ENCOUNTER — Other Ambulatory Visit (HOSPITAL_BASED_OUTPATIENT_CLINIC_OR_DEPARTMENT_OTHER): Payer: Self-pay

## 2024-02-22 NOTE — Telephone Encounter (Signed)
 Got at [facility]

## 2024-02-23 ENCOUNTER — Other Ambulatory Visit (HOSPITAL_COMMUNITY): Payer: Self-pay

## 2024-02-24 ENCOUNTER — Other Ambulatory Visit: Payer: Self-pay | Admitting: Family Medicine

## 2024-02-24 DIAGNOSIS — E1169 Type 2 diabetes mellitus with other specified complication: Secondary | ICD-10-CM

## 2024-03-28 ENCOUNTER — Other Ambulatory Visit (HOSPITAL_COMMUNITY): Payer: Self-pay

## 2024-04-01 ENCOUNTER — Other Ambulatory Visit (HOSPITAL_COMMUNITY): Payer: Self-pay

## 2024-04-09 ENCOUNTER — Telehealth: Payer: Self-pay | Admitting: Family Medicine

## 2024-04-09 ENCOUNTER — Other Ambulatory Visit (HOSPITAL_COMMUNITY): Payer: Self-pay

## 2024-04-09 ENCOUNTER — Encounter: Payer: Self-pay | Admitting: Family Medicine

## 2024-04-09 DIAGNOSIS — E1169 Type 2 diabetes mellitus with other specified complication: Secondary | ICD-10-CM

## 2024-04-09 MED ORDER — LANTUS SOLOSTAR 100 UNIT/ML ~~LOC~~ SOPN
70.0000 [IU] | PEN_INJECTOR | Freq: Every day | SUBCUTANEOUS | 3 refills | Status: AC
Start: 2024-04-09 — End: ?

## 2024-04-09 NOTE — Telephone Encounter (Signed)
 Will try and replace with lantus . Rx sent to [facility].

## 2024-04-09 NOTE — Telephone Encounter (Signed)
 Patient called stating that Semglee  is on back order everywhere and she can't find a pharmacy that has it in stock. Needs advise.

## 2024-04-09 NOTE — Telephone Encounter (Signed)
 Copied from CRM [id number]. Topic: Clinical - Medication Refill >> Apr 09, 2024  9:50 AM Harlene ORN wrote: Medication: insulin  glargine-yfgn (SEMGLEE ) 100 UNIT/ML Pen  Has the patient contacted their pharmacy? Yes (Agent: If no, request that the patient contact the pharmacy for the refill. If patient does not wish to contact the pharmacy document the reason why and proceed with request.) (Agent: If yes, when and what did the pharmacy advise?)  This is the patient's preferred pharmacy:  [id number] - [place], [place] - [address] [place] [address] Phone: [phone] Fax: [phone]  Is this the correct pharmacy for this prescription? Yes If no, delete pharmacy and type the correct one.   Has the prescription been filled recently? No  Is the patient out of the medication? No  Has the patient been seen for an appointment in the last year OR does the patient have an upcoming appointment? Yes  Can we respond through MyChart? Yes  Agent: Please be advised that Rx refills may take up to 3 business days. We ask that you follow-up with your pharmacy.

## 2024-04-09 NOTE — Telephone Encounter (Signed)
 Patient aware and verbalized understanding.

## 2024-04-24 ENCOUNTER — Other Ambulatory Visit: Payer: Self-pay | Admitting: Family Medicine

## 2024-04-24 DIAGNOSIS — E1169 Type 2 diabetes mellitus with other specified complication: Secondary | ICD-10-CM

## 2024-05-06 ENCOUNTER — Ambulatory Visit: Payer: Self-pay | Admitting: Family Medicine

## 2024-05-06 ENCOUNTER — Encounter: Payer: Self-pay | Admitting: Family Medicine

## 2024-05-06 VITALS — BP 105/74 | HR 99 | Temp 98.0°F | Ht 66.0 in | Wt 184.0 lb

## 2024-05-06 DIAGNOSIS — E1159 Type 2 diabetes mellitus with other circulatory complications: Secondary | ICD-10-CM

## 2024-05-06 DIAGNOSIS — J019 Acute sinusitis, unspecified: Secondary | ICD-10-CM | POA: Diagnosis not present

## 2024-05-06 DIAGNOSIS — Z794 Long term (current) use of insulin: Secondary | ICD-10-CM

## 2024-05-06 DIAGNOSIS — R6889 Other general symptoms and signs: Secondary | ICD-10-CM

## 2024-05-06 DIAGNOSIS — B9689 Other specified bacterial agents as the cause of diseases classified elsewhere: Secondary | ICD-10-CM

## 2024-05-06 DIAGNOSIS — E1169 Type 2 diabetes mellitus with other specified complication: Secondary | ICD-10-CM | POA: Diagnosis not present

## 2024-05-06 DIAGNOSIS — I152 Hypertension secondary to endocrine disorders: Secondary | ICD-10-CM | POA: Diagnosis not present

## 2024-05-06 LAB — BAYER DCA HB A1C WAIVED: HB A1C (BAYER DCA - WAIVED): 6.8 % — ABNORMAL HIGH (ref 4.8–5.6)

## 2024-05-06 LAB — VERITOR SARS-COV-2 AND FLU A+B
BD Veritor SARS-CoV-2 Ag: NEGATIVE
Influenza A: NEGATIVE
Influenza B: NEGATIVE

## 2024-05-06 MED ORDER — GUAIFENESIN-CODEINE 100-10 MG/5ML PO SOLN: 5.0000 mL | Freq: Four times a day (QID) | ORAL | 0 refills | Status: AC | PRN

## 2024-05-06 MED ORDER — BENZONATATE 200 MG PO CAPS: 200.0000 mg | ORAL_CAPSULE | Freq: Two times a day (BID) | ORAL | 0 refills | Status: AC | PRN

## 2024-05-06 MED ORDER — CEFDINIR 300 MG PO CAPS: 300.0000 mg | ORAL_CAPSULE | Freq: Two times a day (BID) | ORAL | 0 refills | Status: AC

## 2024-05-06 NOTE — Progress Notes (Signed)
 Subjective: CC:DM PCP: [name], DO YEP:Carrie [name] is a 63 y.o. female presenting to clinic today for:  Type 2 Diabetes with hypertension, hyperlipidemia/ URI Glucometer: Dexcom 7.  Reports blood sugars are averaging around 100s.  Occasionally has 200s as a postprandial glucose.  Oh rare glucose that is less than 70.  She is compliant with Mounjaro  7.5 mg.  Has not advanced yet and plans to do so for next month.  Denies any GI symptoms or side effects.  ROS: Denies dizziness, LOC, polyuria, polydipsia, unintended weight loss/gain, foot ulcerations, numbness or tingling in extremities, shortness of breath or chest pain.  She does report URI symptoms that been present for almost 2 weeks now.  She feels that they are worsening.  Reports copious drainage, productive cough.  No hemoptysis or brown sputum.  No shortness of breath.  Reports mild wheezing and myalgia.  No known sick contacts.  Has utilized Advil Cold and Sinus with minimal improvement in symptoms   Diabetes Health Maintenance Due  Topic Date Due   OPHTHALMOLOGY EXAM  02/09/2024   HEMOGLOBIN A1C  05/02/2024   FOOT EXAM  10/30/2024    ROS: Per HPI  Allergies  Allergen Reactions   Metformin  Hcl     Other Reaction(s): upset stomach   Past Medical History:  Diagnosis Date   Depression    Diabetes mellitus without complication (HCC)    Fever blister    Gastroparesis    Hyperlipidemia    Hypertension    Migraine     Current Outpatient Medications:    aspirin  EC 81 MG tablet, Take 81 mg by mouth daily. Swallow whole., Disp: , Rfl:    atorvastatin  (LIPITOR) 10 MG tablet, Take 1 tablet by mouth once daily, Disp: 90 tablet, Rfl: 0   cariprazine  (VRAYLAR ) 1.5 MG capsule, Take 1 capsule (1.5 mg total) by mouth daily., Disp: 90 capsule, Rfl: 3   cholecalciferol (VITAMIN D3) 25 MCG (1000 UNIT) tablet, Take 1,000 Units by mouth daily., Disp: , Rfl:    clobetasol ointment (TEMOVATE) 0.05 %, Apply 1 Application  topically as needed. (Patient not taking: Reported on 02/05/2024), Disp: , Rfl:    Continuous Glucose Sensor (DEXCOM G7 SENSOR) MISC, Use to continuously monitor sugars. Change every 10 days E11.69, Disp: 9 each, Rfl: 4   desloratadine  (CLARINEX ) 5 MG tablet, Take 1 tablet (5 mg total) by mouth daily. For allergy (Patient not taking: Reported on 02/05/2024), Disp: 90 tablet, Rfl: 3   desvenlafaxine  (PRISTIQ ) 100 MG 24 hr tablet, Take 1 tablet by mouth once daily, Disp: 90 tablet, Rfl: 0   insulin  glargine (LANTUS  SOLOSTAR) 100 UNIT/ML Solostar Pen, Inject 70 Units into the skin at bedtime. To replace Semglee  that is on backorder, Disp: 60 mL, Rfl: 3   Insulin  Pen Needle (RELION PEN NEEDLES) 32G X 4 MM MISC, USE TO INJECT MEDICATION ONCE DAILY Dx 10.9, Disp: 100 each, Rfl: 0   JARDIANCE  25 MG TABS tablet, TAKE 1 TABLET BY MOUTH BEFORE BREAKFAST, Disp: 90 tablet, Rfl: 0   losartan -hydrochlorothiazide (HYZAAR) 50-12.5 MG tablet, Take 1 tablet by mouth daily., Disp: , Rfl:    metoCLOPramide  (REGLAN ) 5 MG tablet, Take 1 tablet (5 mg total) by mouth in the morning and at bedtime. Please keep your upcoming appointment for any further refills. Thank you (Patient not taking: Reported on 02/05/2024), Disp: 90 tablet, Rfl: 6   omeprazole  (PRILOSEC) 20 MG capsule, Take 1 capsule (20 mg total) by mouth daily. TAKE 1 CAPSULE  BY MOUTH ONCE DAILY 30-60 MINUTES BEFORE BREAKFAST AS NEEDED., Disp: 90 capsule, Rfl: 3   ondansetron  (ZOFRAN -ODT) 4 MG disintegrating tablet, Take 1 tablet (4 mg total) by mouth every 8 (eight) hours as needed for nausea or vomiting., Disp: 20 tablet, Rfl: 0   SUMAtriptan  (IMITREX ) 100 MG tablet, May repeat in 2 hours if headache persists or recurs., Disp: 10 tablet, Rfl: 3   tirzepatide  (MOUNJARO ) 10 MG/0.5ML Pen, Inject 10 mg into the skin once a week. (Patient not taking: Reported on 02/05/2024), Disp: 2 mL, Rfl: 2   tirzepatide  (MOUNJARO ) 12.5 MG/0.5ML Pen, Inject 12.5 mg into the skin once a week.  (Patient not taking: Reported on 02/05/2024), Disp: 2 mL, Rfl: 2   tirzepatide  (MOUNJARO ) 7.5 MG/0.5ML Pen, Inject 7.5 mg into the skin once a week. (Patient not taking: Reported on 02/05/2024), Disp: 2 mL, Rfl: 2   Trospium  Chloride 60 MG CP24, Take 1 capsule by mouth once daily (Patient not taking: Reported on 02/05/2024), Disp: 30 capsule, Rfl: 8   valACYclovir  (VALTREX ) 1000 MG tablet, Take 1,000 mg by mouth 2 (two) times daily., Disp: , Rfl:    YUVAFEM 10 MCG TABS vaginal tablet, Place vaginally. (Patient not taking: Reported on 02/05/2024), Disp: , Rfl:  Social History   Socioeconomic History   Marital status: Married    Spouse name: Not on file   Number of children: 2   Years of education: Not on file   Highest education level: Not on file  Occupational History   Occupation: customer service  Tobacco Use   Smoking status: Former    Current packs/day: 0.00    Average packs/day: 0.5 packs/day for 40.0 years (20.0 ttl pk-yrs)    Types: Cigarettes    Start date: 38    Quit date: 2006    Years since quitting: 19.9   Smokeless tobacco: Never  Vaping Use   Vaping status: Never Used  Substance and Sexual Activity   Alcohol use: Yes    Comment: occasional    Drug use: No   Sexual activity: Yes    Birth control/protection: Post-menopausal  Other Topics Concern   Not on file  Social History Narrative   .      Right handed    Lives with husband    Social Drivers of Corporate Investment Banker Strain: Not on file  Food Insecurity: Not on file  Transportation Needs: Not on file  Physical Activity: Not on file  Stress: Not on file  Social Connections: Unknown (10/18/2021)   Received from [facility]   Social Network    Social Network: Not on file  Intimate Partner Violence: Unknown (09/09/2021)   Received from [facility]   HITS    Physically Hurt: Not on file    Insult or Talk Down To: Not on file    Threaten Physical Harm: Not on file    Scream or Curse: Not on file    Family History  Problem Relation Age of Onset   Ovarian cancer Mother    Mental illness Daughter    Migraines Daughter    Colon cancer Neg Hx    Rectal cancer Neg Hx    Throat cancer Neg Hx     Objective: Office vital signs reviewed. BP 105/74   Pulse 99   Temp 98 F (36.7 C)   Ht 5' 6 (1.676 m)   Wt 184 lb (83.5 kg)   SpO2 93%   BMI 29.70 kg/m   Physical Examination:  General: Awake, alert, tired appearing, No acute distress HEENT: Normal    Neck: No masses palpated. No lymphadenopathy    Ears: Tympanic membranes intact, normal light reflex, no erythema, no bulging    Eyes: PERRLA, extraocular membranes intact, sclera white    Nose: nasal turbinates moist, clear nasal discharge    Throat: moist mucus membranes, mild oropharyngeal erythema, no tonsillar exudate.  Airway is patent Cardio: regular rate and rhythm, S1S2 heard, no murmurs appreciated Pulm: clear to auscultation bilaterally, right lower lung wheeze that clears with cough.  No rhonchi or rales; normal work of breathing on room air  Lab Results  Component Value Date   HGBA1C 7.3 (H) 10/31/2023    Assessment/ Plan: 63 y.o. female   Type 2 diabetes mellitus with other specified complication, with long-term current use of insulin  (HCC) - Plan: Bayer DCA Hb A1c Waived  Hypertension associated with diabetes (HCC) - Plan: Basic Metabolic Panel  Hyperlipidemia associated with type 2 diabetes mellitus (HCC)  Flu-like symptoms - Plan: Veritor SARS-CoV-2 and Flu A+B, benzonatate  (TESSALON ) 200 MG capsule, guaiFENesin-codeine 100-10 MG/5ML syrup  Acute bacterial sinusitis - Plan: cefdinir (OMNICEF) 300 MG capsule, benzonatate  (TESSALON ) 200 MG capsule, guaiFENesin-codeine 100-10 MG/5ML syrup  Check A1c.  Advised to advance Mounjaro  to 10 mg weekly.  Check BMP.  Blood pressure controlled.  No changes for now  Not due for fasting lipid.  Advised to schedule physical  Rapid flu and COVID pending but given that  she is almost 2 weeks into the infection she is outside of the window with antivirals.  Suspect secondary bacterial infection based on progressive symptoms.  Omnicef sent.  Tessalon  Perles as needed, Robitussin with codeine as needed.  Caution sedation.  Hydrate.  Follow-up if any red flag signs or symptoms develop  Otherwise plan for full physical with fasting labs in 4 months   Carrie [name] CHRISTELLA Fielding, DO [facility] [phone]

## 2024-05-06 NOTE — Patient Instructions (Addendum)
 Get diabetic eye exam done ASAP. Schedule visit for flu shot in a couple of weeks.

## 2024-05-07 ENCOUNTER — Ambulatory Visit: Payer: Self-pay | Admitting: Family Medicine

## 2024-05-07 LAB — BASIC METABOLIC PANEL WITH GFR
BUN/Creatinine Ratio: 31 — ABNORMAL HIGH (ref 12–28)
BUN: 22 mg/dL (ref 8–27)
CO2: 21 mmol/L (ref 20–29)
Calcium: 9.8 mg/dL (ref 8.7–10.3)
Chloride: 98 mmol/L (ref 96–106)
Creatinine, Ser: 0.7 mg/dL (ref 0.57–1.00)
Glucose: 219 mg/dL — ABNORMAL HIGH (ref 70–99)
Potassium: 3.9 mmol/L (ref 3.5–5.2)
Sodium: 137 mmol/L (ref 134–144)
eGFR: 97 mL/min/1.73 (ref >59)

## 2024-05-09 ENCOUNTER — Other Ambulatory Visit: Payer: Self-pay | Admitting: Family Medicine

## 2024-05-09 DIAGNOSIS — F331 Major depressive disorder, recurrent, moderate: Secondary | ICD-10-CM

## 2024-05-23 ENCOUNTER — Other Ambulatory Visit: Payer: Self-pay | Admitting: Family Medicine

## 2024-05-23 DIAGNOSIS — E1169 Type 2 diabetes mellitus with other specified complication: Secondary | ICD-10-CM

## 2024-06-04 ENCOUNTER — Ambulatory Visit: Payer: Self-pay

## 2024-06-04 DIAGNOSIS — E118 Type 2 diabetes mellitus with unspecified complications: Secondary | ICD-10-CM

## 2024-06-04 MED ORDER — SEMAGLUTIDE (2 MG/DOSE) 8 MG/3ML ~~LOC~~ SOPN: 2.0000 mg | PEN_INJECTOR | SUBCUTANEOUS | 3 refills | Status: DC

## 2024-06-04 NOTE — Telephone Encounter (Signed)
 " FYI Only or Action Required?: Action required by provider: clinical question for provider and update on patient condition.  Patient was last seen in primary care on 05/06/2024 by [name], DO.  Called Nurse Triage reporting Medication Problem.  Symptoms began several weeks ago.  Interventions attempted: Rest, hydration, or home remedies.  Symptoms are: gradually worsening.  Triage Disposition: Call PCP When Office is Open  Patient/caregiver understands and will follow disposition?: Yes        Message from [place] sent at 06/04/2024  8:36 AM EST  Summary: Diarrhea/vommiting   Reason for Triage: Patient took her injection of tirzepatide  (MOUNJARO ) 10 MG/0.5ML Pen, states she has been sick every Monday and Wednesday since she started it three weeks ago. This week has been the worst since the increase of dose. She has vomited 5 times this week and has diarrhea, want to see about going on her original medication- ozempic .  Patient can be reached at [phone]      Reason for Disposition  Caller wants to use a complementary or alternative medicine  Answer Assessment - Initial Assessment Questions Patient states that three weeks ago her dose of Mounjaro  was increased to 10mg  and she has been sick ever since with vomiting and diarrhea. Patient states she has been trying to stay hydrated. Patient states that she has only had these episodes of vomiting and diarrhea on Mondays and Wednesdays with Mondays being the worst.  Patient denies any head injuries, falls, hitting her head, eye pain, abdominal trauma or abdominal pain, and states that her blood sugar was around 120.  Today she has vomited and had diarrhea about 5 times each and denies any blood being noted in vomit or stools.  She also denies any fevers. Patient states that she just wants to see if she can go back to her previous medication-- Ozempic .  She states these episodes started when the dose of  Mounjaro  was increased to 10mg  and happen each week after each injection on Sunday. She states that she will come in for an appointment if her PCP wants to see her again but she would just like to go back to Ozempic  if possible.  Patient is advised to call us  back if anything changes or with any further questions/concerns. Patient is advised that if anything worsens to go to the Emergency Room. Patient verbalized understanding.  Answer Assessment - Initial Assessment Questions Patient states that three weeks ago her dose of Mounjaro  was increased to 10mg  and she has been sick ever since with vomiting and diarrhea. Patient states she has been trying to stay hydrated. Patient states that she has only had these episodes of vomiting and diarrhea on Mondays and Wednesdays with Mondays being the worst. She states she feels better the rest of the days of the week.  Patient denies any head injuries, falls, hitting her head, eye pain, abdominal trauma or abdominal pain, and states that her blood sugar was around 120.  Patient is advised that typically with these symptoms it is advised to be seen and evaluated but patient wants to send a message to her PCP first about this and states it started only after the dose increase and only occurs after each administration of the medication Mounjaro . Today she has vomited and had diarrhea about 5 times each and denies any blood being noted in vomit or stools.  She also denies any fevers. Patient states that she just wants to see if she can go back to her  previous medication-- Ozempic .  She states these episodes started when the dose of Mounjaro  was increased to 10mg  and happen each week after each injection on Sunday. She states that she will come in for an appointment if her PCP wants to see her again but she would just like to go back to Ozempic  if possible.  Patient is advised to call us  back if anything changes or with any further questions/concerns. Patient is  advised that if anything worsens to go to the Emergency Room. Patient verbalized understanding.  Protocols used: Vomiting-A-AH, Medication Question Call-A-AH  "

## 2024-06-04 NOTE — Telephone Encounter (Signed)
 Spoke to patient.  She may discontinue Mounjaro .  Will start her at 1 mg of Ozempic  weekly and then she may advance to 2 mg as tolerated as this was her previous dosing.  She voiced good understanding and will follow-up as needed

## 2024-06-04 NOTE — Telephone Encounter (Signed)
 Called [facility] and advised O[facility] of patient's symptoms.

## 2024-06-16 ENCOUNTER — Other Ambulatory Visit: Payer: Self-pay | Admitting: *Deleted

## 2024-06-16 ENCOUNTER — Telehealth: Payer: Self-pay | Admitting: Family Medicine

## 2024-06-16 DIAGNOSIS — E118 Type 2 diabetes mellitus with unspecified complications: Secondary | ICD-10-CM

## 2024-06-16 MED ORDER — SEMAGLUTIDE (2 MG/DOSE) 8 MG/3ML ~~LOC~~ SOPN: 2.0000 mg | PEN_INJECTOR | SUBCUTANEOUS | 3 refills | Status: AC

## 2024-06-16 NOTE — Telephone Encounter (Signed)
 Copied from CRM #[id number]. Topic: Clinical - Prescription Issue >> Jun 16, 2024  9:00 AM Graeme ORN wrote: Reason for CRM: Patient called. States Rx for Ozempic  was supposed to be sent in by provider to [facility]. Patient states pharmacy does not have it. Looks like it was put as phone in 12/31. Would like someone to check. Thank You

## 2024-06-16 NOTE — Telephone Encounter (Signed)
 Resent prescription. It now shows received by pharmacy. Notified patient.

## 2024-09-10 ENCOUNTER — Ambulatory Visit
